# Patient Record
Sex: Female | Born: 1950
Health system: Southern US, Community
[De-identification: ages and names within clinical notes are randomized; demographics above are authoritative.]

## PROBLEM LIST (undated history)

## (undated) DIAGNOSIS — N200 Calculus of kidney: Secondary | ICD-10-CM

## (undated) DIAGNOSIS — IMO0002 Reserved for concepts with insufficient information to code with codable children: Secondary | ICD-10-CM

## (undated) DIAGNOSIS — K5730 Diverticulosis of large intestine without perforation or abscess without bleeding: Secondary | ICD-10-CM

## (undated) DIAGNOSIS — D649 Anemia, unspecified: Secondary | ICD-10-CM

## (undated) DIAGNOSIS — M1990 Unspecified osteoarthritis, unspecified site: Secondary | ICD-10-CM

## (undated) DIAGNOSIS — E669 Obesity, unspecified: Secondary | ICD-10-CM

## (undated) DIAGNOSIS — I10 Essential (primary) hypertension: Secondary | ICD-10-CM

## (undated) DIAGNOSIS — I6529 Occlusion and stenosis of unspecified carotid artery: Secondary | ICD-10-CM

## (undated) HISTORY — PX: CHOLECYSTECTOMY: SHX55

## (undated) HISTORY — DX: Occlusion and stenosis of unspecified carotid artery: I65.29

## (undated) HISTORY — DX: Obesity, unspecified: E66.9

## (undated) HISTORY — PX: TONSILLECTOMY: SUR1361

---

## 2001-09-23 ENCOUNTER — Encounter: Admission: RE | Admit: 2001-09-23 | Discharge: 2001-09-23 | Payer: Self-pay | Admitting: Internal Medicine

## 2001-09-23 ENCOUNTER — Encounter: Payer: Self-pay | Admitting: Internal Medicine

## 2001-10-02 ENCOUNTER — Other Ambulatory Visit: Admission: RE | Admit: 2001-10-02 | Discharge: 2001-10-02 | Payer: Self-pay | Admitting: Internal Medicine

## 2004-08-29 ENCOUNTER — Other Ambulatory Visit: Admission: RE | Admit: 2004-08-29 | Discharge: 2004-08-29 | Payer: Self-pay | Admitting: Internal Medicine

## 2004-10-11 ENCOUNTER — Ambulatory Visit (HOSPITAL_COMMUNITY): Admission: RE | Admit: 2004-10-11 | Discharge: 2004-10-12 | Payer: Self-pay | Admitting: General Surgery

## 2004-10-11 ENCOUNTER — Encounter (INDEPENDENT_AMBULATORY_CARE_PROVIDER_SITE_OTHER): Payer: Self-pay | Admitting: *Deleted

## 2004-10-18 ENCOUNTER — Encounter: Admission: RE | Admit: 2004-10-18 | Discharge: 2004-10-18 | Payer: Self-pay | Admitting: Internal Medicine

## 2006-08-21 ENCOUNTER — Encounter: Admission: RE | Admit: 2006-08-21 | Discharge: 2006-08-21 | Payer: Self-pay | Admitting: Endocrinology

## 2008-01-21 ENCOUNTER — Encounter: Admission: RE | Admit: 2008-01-21 | Discharge: 2008-01-21 | Payer: Self-pay | Admitting: Endocrinology

## 2009-01-21 ENCOUNTER — Encounter: Admission: RE | Admit: 2009-01-21 | Discharge: 2009-01-21 | Payer: Self-pay | Admitting: Endocrinology

## 2010-02-01 ENCOUNTER — Encounter: Admission: RE | Admit: 2010-02-01 | Discharge: 2010-02-01 | Payer: Self-pay | Admitting: Endocrinology

## 2010-04-15 ENCOUNTER — Encounter: Admission: RE | Admit: 2010-04-15 | Discharge: 2010-04-15 | Payer: Self-pay | Admitting: Endocrinology

## 2010-10-25 ENCOUNTER — Encounter
Admission: RE | Admit: 2010-10-25 | Discharge: 2010-10-25 | Payer: Self-pay | Source: Home / Self Care | Attending: Family Medicine | Admitting: Family Medicine

## 2011-02-08 ENCOUNTER — Other Ambulatory Visit: Payer: Self-pay | Admitting: Endocrinology

## 2011-02-08 DIAGNOSIS — Z1231 Encounter for screening mammogram for malignant neoplasm of breast: Secondary | ICD-10-CM

## 2011-02-10 NOTE — Op Note (Signed)
Kristine Gray, Kristine Gray NO.:  [id number]   MEDICAL RECORD NO.:  [id number]          PATIENT TYPE:  OIB   LOCATION:  2550                         FACILITY:  [facility]   PHYSICIAN:  Adolph Pollack, M.D.DATE OF BIRTH:  [birth year]   DATE OF PROCEDURE:  10/11/2004  DATE OF DISCHARGE:                                 OPERATIVE REPORT   PREOPERATIVE DIAGNOSIS:  Biliary dyskinesia.   POSTOPERATIVE DIAGNOSIS:  Biliary dyskinesia.   PROCEDURE:  Laparoscopic cholecystectomy with intraoperative cholangiogram.   ASSISTANT:  Anselm Pancoast. Zachery Dakins, M.D.   ANESTHESIA:  General.   INDICATION:  Kristine Gray is a 60 year old female who has had intermittent  problems with some nausea, vomiting and left upper quadrant pain.  She  periodically has some pain in the right shoulder and between her scapulae.  CT scan and ultrasounds have been unrevealing.  Gastric emptying scan was  normal about a year ago.  Nuclear medicine hepatobiliary scan with CCK  injection demonstrated a depressed gallbladder ejection fraction consistent  with biliary dyskinesia.  She has been seeing Dr. Kinnie Scales.  An upper  endoscopy has not confirmed any significant pathology.  She now presents for  elective laparoscopic cholecystectomy.  The procedure and the risks were  explained to her.   TECHNIQUE:  She was seen in the holding area, then brought to the operating  room, placed supine on the operating table, and general anesthetic was  administered.  The abdominal wall was sterilely prepped and draped.  A  supraumbilical incision was made after injection of Marcaine solution.  The  subcutaneous tissue was dissected bluntly.  The fascia was identified and a  small incision made in the midline fascia.  The peritoneal cavity was  entered bluntly and, under direct vision, a pursestring suture of 0 Vicryl  was placed around the fascial edges.  A Hasson trocar was introduced into  the peritoneal cavity.   Pneumoperitoneum was created by instillation of CO2  gas.   Next, she was placed in reverse Trendelenburg position with the right side  tilted slightly upward.  An 11-mm trocar was placed in the epigastric  incision and two 5-mm trocars were placed in the right midline abdomen.  The  fundus of the gallbladder was grasped and retracted toward the right  shoulder.  The infundibulum was grasped and mobilized, using blunt  dissection and select cautery.  I identified the cystic duct and created a  window around it.  Anterior branch of the cyst artery was also identified  and a window created around it.  Clip was placed at the cyst  duct/gallbladder junction and a small incision made in the cystic duct.  A  Cholangiocath was passed through the anterior abdominal wall,placed into the  cystic duct and a cholangiogram was performed.   Under real time fluoroscopy, dilute contrast was injected into the cystic  duct.  The common hepatic and common bile duct filled properly and drained  contrast into the duodenum properly.  Right and left hepatic ducts were also  seen.  No obstructive lesions were noted.   The cholangiocath was removed, the cystic duct  was clipped three times  proximally and divided.  The anterior branch of the cystic artery was  clipped and divided.  The posterior branch of the cystic artery was  identified, clipped and divided.  The gallbladder was dissected free, using  electrocautery.  There was a small spillage of bile.  The gallbladder was  placed in the EndoCatch bag.   The gallbladder fossa was irrigated.  Bleeding points were controlled with  cautery.  Once hemostasis was adequate and irrigation fluid was clear, I  removed the gallbladder through the subumbilical port and then, under  laparoscopic vision, closed the fascial defect by tightening up and tying  down the pursestring suture.  The perihepatic area was evacuated of its  irrigation fluid by way of  suction.  No  bile leak or bleeding was noted.   The trocars were then removed and the pneumoperitoneum was released.  The  skin incisions were closed with 4-0 Monocryl subcuticular stitches followed  by Steri-Strips and sterile dressings.   She tolerated the procedure well without any apparent complications and was  taken to the recovery room in satisfactory condition.      TJR/MEDQ  D:  10/11/2004  T:  10/11/2004  Job:  [id number]   cc:   Sharlet Salina, M.D.  [address]  [place]  [place] [address]  Fax: [phone]   Tera Mater. Evlyn Kanner, M.D.  [address]  [place]  [place] [address]  Fax: [phone]   Griffith Citron, M.D.  [facility] [place]  [place] [address]  Fax: [phone]

## 2011-02-15 ENCOUNTER — Ambulatory Visit
Admission: RE | Admit: 2011-02-15 | Discharge: 2011-02-15 | Disposition: A | Discharge status: Home / Self Care | Payer: 59 | Source: Ambulatory Visit | Attending: Endocrinology | Admitting: Endocrinology

## 2011-02-15 DIAGNOSIS — Z1231 Encounter for screening mammogram for malignant neoplasm of breast: Secondary | ICD-10-CM

## 2012-02-26 ENCOUNTER — Other Ambulatory Visit: Payer: Self-pay | Admitting: Family Medicine

## 2012-02-26 DIAGNOSIS — Z1231 Encounter for screening mammogram for malignant neoplasm of breast: Secondary | ICD-10-CM

## 2012-03-11 ENCOUNTER — Ambulatory Visit
Admission: RE | Admit: 2012-03-11 | Discharge: 2012-03-11 | Disposition: A | Discharge status: Home / Self Care | Payer: 59 | Source: Ambulatory Visit | Attending: Family Medicine | Admitting: Family Medicine

## 2012-03-11 DIAGNOSIS — Z1231 Encounter for screening mammogram for malignant neoplasm of breast: Secondary | ICD-10-CM

## 2012-03-18 ENCOUNTER — Ambulatory Visit (HOSPITAL_BASED_OUTPATIENT_CLINIC_OR_DEPARTMENT_OTHER)
Admission: RE | Admit: 2012-03-18 | Discharge: 2012-03-18 | Disposition: A | Discharge status: Home / Self Care | Payer: 59 | Source: Ambulatory Visit | Attending: Family Medicine | Admitting: Family Medicine

## 2012-03-18 ENCOUNTER — Encounter (HOSPITAL_COMMUNITY): Payer: Self-pay | Admitting: *Deleted

## 2012-03-18 ENCOUNTER — Inpatient Hospital Stay (HOSPITAL_COMMUNITY)
Admission: EM | Admit: 2012-03-18 | Discharge: 2012-03-22 | DRG: 854 | Disposition: A | Discharge status: Home / Self Care | Payer: 59 | Attending: Family Medicine | Admitting: Family Medicine

## 2012-03-18 ENCOUNTER — Other Ambulatory Visit (HOSPITAL_BASED_OUTPATIENT_CLINIC_OR_DEPARTMENT_OTHER): Payer: Self-pay | Admitting: Family Medicine

## 2012-03-18 DIAGNOSIS — R110 Nausea: Secondary | ICD-10-CM | POA: Insufficient documentation

## 2012-03-18 DIAGNOSIS — N19 Unspecified kidney failure: Secondary | ICD-10-CM | POA: Diagnosis present

## 2012-03-18 DIAGNOSIS — Z9089 Acquired absence of other organs: Secondary | ICD-10-CM

## 2012-03-18 DIAGNOSIS — N12 Tubulo-interstitial nephritis, not specified as acute or chronic: Secondary | ICD-10-CM | POA: Diagnosis present

## 2012-03-18 DIAGNOSIS — IMO0001 Reserved for inherently not codable concepts without codable children: Secondary | ICD-10-CM | POA: Diagnosis present

## 2012-03-18 DIAGNOSIS — N200 Calculus of kidney: Secondary | ICD-10-CM | POA: Diagnosis present

## 2012-03-18 DIAGNOSIS — R109 Unspecified abdominal pain: Secondary | ICD-10-CM

## 2012-03-18 DIAGNOSIS — N1330 Unspecified hydronephrosis: Secondary | ICD-10-CM | POA: Diagnosis present

## 2012-03-18 DIAGNOSIS — Z87442 Personal history of urinary calculi: Secondary | ICD-10-CM

## 2012-03-18 DIAGNOSIS — A4151 Sepsis due to Escherichia coli [E. coli]: Principal | ICD-10-CM | POA: Diagnosis present

## 2012-03-18 DIAGNOSIS — E785 Hyperlipidemia, unspecified: Secondary | ICD-10-CM | POA: Diagnosis present

## 2012-03-18 DIAGNOSIS — E860 Dehydration: Secondary | ICD-10-CM | POA: Diagnosis present

## 2012-03-18 DIAGNOSIS — I10 Essential (primary) hypertension: Secondary | ICD-10-CM | POA: Diagnosis present

## 2012-03-18 DIAGNOSIS — Z880 Allergy status to penicillin: Secondary | ICD-10-CM

## 2012-03-18 DIAGNOSIS — N179 Acute kidney failure, unspecified: Secondary | ICD-10-CM | POA: Diagnosis present

## 2012-03-18 DIAGNOSIS — E872 Acidosis, unspecified: Secondary | ICD-10-CM | POA: Diagnosis present

## 2012-03-18 DIAGNOSIS — K5730 Diverticulosis of large intestine without perforation or abscess without bleeding: Secondary | ICD-10-CM | POA: Insufficient documentation

## 2012-03-18 DIAGNOSIS — N136 Pyonephrosis: Secondary | ICD-10-CM

## 2012-03-18 DIAGNOSIS — R1032 Left lower quadrant pain: Secondary | ICD-10-CM | POA: Insufficient documentation

## 2012-03-18 DIAGNOSIS — E119 Type 2 diabetes mellitus without complications: Secondary | ICD-10-CM | POA: Diagnosis present

## 2012-03-18 DIAGNOSIS — R1031 Right lower quadrant pain: Secondary | ICD-10-CM | POA: Insufficient documentation

## 2012-03-18 DIAGNOSIS — A419 Sepsis, unspecified organism: Secondary | ICD-10-CM | POA: Diagnosis present

## 2012-03-18 HISTORY — DX: Essential (primary) hypertension: I10

## 2012-03-18 HISTORY — DX: Calculus of kidney: N20.0

## 2012-03-18 LAB — URINALYSIS, ROUTINE W REFLEX MICROSCOPIC
Nitrite: NEGATIVE
Protein, ur: 30 mg/dL — AB
Specific Gravity, Urine: 1.027 (ref 1.005–1.030)
Urobilinogen, UA: 0.2 mg/dL (ref 0.0–1.0)
pH: 5 (ref 5.0–8.0)

## 2012-03-18 LAB — DIFFERENTIAL
Basophils Absolute: 0 10*3/uL (ref 0.0–0.1)
Basophils Relative: 0 % (ref 0–1)
Eosinophils Absolute: 0 10*3/uL (ref 0.0–0.7)
Eosinophils Relative: 0 % (ref 0–5)
Lymphocytes Relative: 8 % — ABNORMAL LOW (ref 12–46)
Monocytes Relative: 6 % (ref 3–12)
Neutrophils Relative %: 86 % — ABNORMAL HIGH (ref 43–77)

## 2012-03-18 LAB — CBC
Platelets: 228 10*3/uL (ref 150–400)
RBC: 4.25 MIL/uL (ref 3.87–5.11)
WBC: 16.7 10*3/uL — ABNORMAL HIGH (ref 4.0–10.5)

## 2012-03-18 LAB — BASIC METABOLIC PANEL
BUN: 45 mg/dL — ABNORMAL HIGH (ref 6–23)
Chloride: 88 mEq/L — ABNORMAL LOW (ref 96–112)
GFR calc Af Amer: 30 mL/min — ABNORMAL LOW (ref >90)
Glucose, Bld: 249 mg/dL — ABNORMAL HIGH (ref 70–99)
Potassium: 4.6 mEq/L (ref 3.5–5.1)

## 2012-03-18 LAB — URINE MICROSCOPIC-ADD ON

## 2012-03-18 MED ORDER — MORPHINE SULFATE 4 MG/ML IJ SOLN
4.0000 mg | Freq: Once | INTRAMUSCULAR | Status: AC
  Administered 2012-03-18: 4 mg via INTRAVENOUS
  Filled 2012-03-18: qty 1

## 2012-03-18 MED ORDER — PROMETHAZINE HCL 25 MG/ML IJ SOLN
25.0000 mg | Freq: Once | INTRAMUSCULAR | Status: DC
  Administered 2012-03-18: 25 mg via INTRAMUSCULAR
  Filled 2012-03-18: qty 1

## 2012-03-18 MED ORDER — DEXTROSE 5 % IV SOLN
1.0000 g | Freq: Once | INTRAVENOUS | Status: AC
  Administered 2012-03-18: 1 g via INTRAVENOUS
  Filled 2012-03-18: qty 10

## 2012-03-18 MED ORDER — ONDANSETRON HCL 4 MG/2ML IJ SOLN
4.0000 mg | Freq: Once | INTRAMUSCULAR | Status: AC
  Administered 2012-03-18: 4 mg via INTRAVENOUS
  Filled 2012-03-18: qty 2

## 2012-03-18 MED ORDER — SODIUM CHLORIDE 0.9 % IV SOLN
500.0000 mg | Freq: Once | INTRAVENOUS | Status: AC
  Administered 2012-03-19: 500 mg via INTRAVENOUS
  Filled 2012-03-18: qty 500

## 2012-03-18 MED ORDER — VANCOMYCIN HCL IN DEXTROSE 1-5 GM/200ML-% IV SOLN
1000.0000 mg | Freq: Once | INTRAVENOUS | Status: AC
  Administered 2012-03-19: 1000 mg via INTRAVENOUS
  Filled 2012-03-18: qty 200

## 2012-03-18 MED ORDER — PROMETHAZINE HCL 25 MG/ML IJ SOLN
12.5000 mg | Freq: Once | INTRAMUSCULAR | Status: DC
  Filled 2012-03-18: qty 1

## 2012-03-18 NOTE — Consult Note (Addendum)
Consult requested by Dr. Toniann Fail Re: Left pyonephrosis, left UPJ stone  History of Present Illness: Patient presents with 2 days of left flank pain that has been getting progressively worse. It started [place] just after [place]. She's had associated nausea and vomiting. She's also had fevers as high as 102. She went to see her primary care physician today who ordered a urinalysis and a CBC which did show a leukocytosis of 17,000. He is sent her for a CT scan which demonstrated a 1 cm UPJ stone, 1 cm LLP stone and a 5 mm LLP stone. She has air in the collecting system and anterior to the kidney.   She's had no trouble voiding and no dysuria.   She saw seen in our office in 2007 with two 1 cm LLP stones. She did not want surgical therapy at the time.     Past Medical History  Diagnosis Date  . Kidney calculi   . Diabetes mellitus   . Hypertension    History reviewed. No pertinent past surgical history.  Home Medications:   (Not in a hospital admission) Allergies:  Allergies  Allergen Reactions  . Penicillins Hives, Itching and Swelling    History reviewed. No pertinent family history. Social History:  reports that she has never smoked. She does not have any smokeless tobacco history on file. She reports that she does not drink alcohol. Her drug history not on file.  ROS: A complete review of systems was performed.  All systems are negative except for pertinent findings as noted. @ROS @   Physical Exam:  Vital signs in last 24 hours: Temp:  [98.9 F (37.2 C)-99.3 F (37.4 C)] 99.3 F (37.4 C) (06/24 2256) Pulse Rate:  [70-77] 70  (06/24 2256) Resp:  [18-20] 20  (06/24 2256) BP: (126-135)/(48-77) 126/48 mmHg (06/24 2256) SpO2:  [97 %-98 %] 98 % (06/24 2256) General:  Alert and oriented, No acute distress. She is vomiting.   HEENT: Normocephalic, atraumatic Neck: No JVD or lymphadenopathy Cardiovascular: Regular rate and rhythm Lungs: Regular rate and effort Abdomen: Soft,  nontender, nondistended, no abdominal masses Back: No CVA tenderness Extremities: No edema Neurologic: Grossly intact  Laboratory Data:  Results for orders placed during the hospital encounter of 03/18/12 (from the past 24 hour(s))  CBC     Status: Abnormal   Collection Time   03/18/12  8:38 PM      Component Value Range   WBC 16.7 (*) 4.0 - 10.5 K/uL   RBC 4.25  3.87 - 5.11 MIL/uL   Hemoglobin 12.4  12.0 - 15.0 g/dL   HCT 16.1  09.6 - 04.5 %   MCV 88.2  78.0 - 100.0 fL   MCH 29.2  26.0 - 34.0 pg   MCHC 33.1  30.0 - 36.0 g/dL   RDW 40.9  81.1 - 91.4 %   Platelets 228  150 - 400 K/uL  DIFFERENTIAL     Status: Abnormal   Collection Time   03/18/12  8:38 PM      Component Value Range   Neutrophils Relative 86 (*) 43 - 77 %   Lymphocytes Relative 8 (*) 12 - 46 %   Monocytes Relative 6  3 - 12 %   Eosinophils Relative 0  0 - 5 %   Basophils Relative 0  0 - 1 %   Neutro Abs 14.4 (*) 1.7 - 7.7 K/uL   Lymphs Abs 1.3  0.7 - 4.0 K/uL   Monocytes Absolute 1.0  0.1 -  1.0 K/uL   Eosinophils Absolute 0.0  0.0 - 0.7 K/uL   Basophils Absolute 0.0  0.0 - 0.1 K/uL   WBC Morphology FEW NEUTROPHIL BANDS PRESNT     Smear Review LARGE PLATELETS PRESENT    BASIC METABOLIC PANEL     Status: Abnormal   Collection Time   03/18/12  8:38 PM      Component Value Range   Sodium 132 (*) 135 - 145 mEq/L   Potassium 4.6  3.5 - 5.1 mEq/L   Chloride 88 (*) 96 - 112 mEq/L   CO2 16 (*) 19 - 32 mEq/L   Glucose, Bld 249 (*) 70 - 99 mg/dL   BUN 45 (*) 6 - 23 mg/dL   Creatinine, Ser 4.13 (*) 0.50 - 1.10 mg/dL   Calcium 24.4  8.4 - 01.0 mg/dL   GFR calc non Af Amer 26 (*) >90 mL/min   GFR calc Af Amer 30 (*) >90 mL/min  URINALYSIS, ROUTINE W REFLEX MICROSCOPIC     Status: Abnormal   Collection Time   03/18/12  8:39 PM      Component Value Range   Color, Urine YELLOW  YELLOW   APPearance CLOUDY (*) CLEAR   Specific Gravity, Urine 1.027  1.005 - 1.030   pH 5.0  5.0 - 8.0   Glucose, UA >1000 (*) NEGATIVE  mg/dL   Hgb urine dipstick LARGE (*) NEGATIVE   Bilirubin Urine MODERATE (*) NEGATIVE   Ketones, ur 40 (*) NEGATIVE mg/dL   Protein, ur 30 (*) NEGATIVE mg/dL   Urobilinogen, UA 0.2  0.0 - 1.0 mg/dL   Nitrite NEGATIVE  NEGATIVE   Leukocytes, UA SMALL (*) NEGATIVE  URINE MICROSCOPIC-ADD ON     Status: Normal   Collection Time   03/18/12  8:39 PM      Component Value Range   Squamous Epithelial / LPF RARE  RARE   WBC, UA 11-20  <3 WBC/hpf   RBC / HPF 11-20  <3 RBC/hpf   Bacteria, UA RARE  RARE   No results found for this or any previous visit (from the past 240 hour(s)). Creatinine:  Basename 03/18/12 2038  CREATININE 1.99*   CT A/P as above - I reviewed the images  Impression/Assessment:  Left UPJ stone Emphysematous Pyonephrosis LLP stones Fever, WBC, ARF, emesis  Plan:  I went over the patients CT results with her. We discussed the nature. R/B of continued medical management alone/non-surgical, ureteral stent or left PCNx tube. She elects left PCNx tube which will allow further decompression of collecting system and possible future access for PCNL. I spoke with Dr. Deanne Coffer about patient and he will come in to place left perc nephrostomy tube.   Follow clinically and repeat CT A/P with in 48 hours to ensure no perinephric abscess has developed.   She is being admitted to medicine for sepsis management for this patient with DM. Will follow. I spoke with Dr. Kirtland Bouchard.    Will follow.  Antony Haste 03/18/2012, 11:33 PM

## 2012-03-18 NOTE — ED Notes (Signed)
Pt family member [facility] [phone] Cell [phone]

## 2012-03-18 NOTE — ED Provider Notes (Signed)
History     CSN: [id number]  Arrival date & time 03/18/12  4098   First MD Initiated Contact with Patient 03/18/12 2020      Chief Complaint  Patient presents with  . Flank Pain    (Consider location/radiation/quality/duration/timing/severity/associated sxs/prior treatment) HPI Comments: Patient presents with 3 days of left flank pain that has been getting progressively worse.  She's had associated nausea and vomiting with it.  She's also had fevers as high as 102.  She went to see her primary care physician today who ordered a urinalysis and a CBC which did show a leukocytosis of 17,000 any UTI.  He is sent her for a CT scan which demonstrated pyonephrosis.  Per the patient she had supposedly been arranged for a direct admit but the flow manager here has no knowledge of this patient as a direct admit so we will evaluate her here in the emergency department and call for admission when her results are returned.  Patient had not started on any antibiotic therapy.  She taken one dose of Zofran for nausea.  She has nausea now at this time and shooting left flank pain.  Patient is a 61 y.o. female presenting with flank pain. The history is provided by the patient and the spouse.  Flank Pain Pertinent negatives include no chest pain, no abdominal pain, no headaches and no shortness of breath.    Past Medical History  Diagnosis Date  . Kidney calculi   . Diabetes mellitus   . Hypertension     History reviewed. No pertinent past surgical history.  History reviewed. No pertinent family history.  History  Substance Use Topics  . Smoking status: Never Smoker   . Smokeless tobacco: Not on file  . Alcohol Use: No    OB History    Grav Para Term Preterm Abortions TAB SAB Ect Mult Living                  Review of Systems  Constitutional: Positive for fever. Negative for chills.  HENT: Negative.   Eyes: Negative.   Respiratory: Negative.  Negative for cough and shortness of breath.    Cardiovascular: Negative.  Negative for chest pain.  Gastrointestinal: Positive for nausea and vomiting. Negative for abdominal pain and diarrhea.  Genitourinary: Positive for dysuria and flank pain.  Musculoskeletal: Negative.  Negative for back pain.  Skin: Negative.  Negative for color change and rash.  Neurological: Negative.  Negative for syncope and headaches.  Hematological: Negative.  Negative for adenopathy.  Psychiatric/Behavioral: Negative.  Negative for confusion.  All other systems reviewed and are negative.    Allergies  Penicillins  Home Medications   Current Outpatient Rx  Name Route Sig Dispense Refill  . ACETAMINOPHEN 500 MG PO TABS Oral Take 500 mg by mouth every 6 (six) hours as needed. For pain    . ACETAMINOPHEN-CODEINE #3 300-30 MG PO TABS Oral Take 1 tablet by mouth every 4 (four) hours as needed. For pain    . ASPIRIN EC 81 MG PO TBEC Oral Take 162 mg by mouth daily.    . AZITHROMYCIN 1 % OP SOLN Both Eyes Place 1 drop into both eyes as needed.    Marland Kitchen BLACK COHOSH PO Oral Take 1 tablet by mouth 2 (two) times daily.    Marland Kitchen CALCIUM CARBONATE 600 MG PO TABS Oral Take 1,500 mg by mouth daily.    Marland Kitchen VITAMIN D3 2000 UNITS PO TABS Oral Take 1 tablet by mouth daily.    Marland Kitchen  CHOLINE FENOFIBRATE 135 MG PO CPDR Oral Take 135 mg by mouth daily.    Marland Kitchen CIPROFLOXACIN HCL 500 MG PO TABS Oral Take 500 mg by mouth 2 (two) times daily.    Marland Kitchen EZETIMIBE 10 MG PO TABS Oral Take 10 mg by mouth daily.    Marland Kitchen FLUTICASONE PROPIONATE 50 MCG/ACT NA SUSP Nasal Place 1 spray into the nose daily.    Marland Kitchen FOLIC ACID 400 MCG PO TABS Oral Take 400 mcg by mouth daily.    . IBUPROFEN 600 MG PO TABS Oral Take 600 mg by mouth every 6 (six) hours as needed. For pain    . INSULIN REGULAR HUMAN 100 UNIT/ML IJ SOLN Subcutaneous Inject 5-16 Units into the skin 2 (two) times daily before a meal. Takes 16 units in the morning and 5 units at night    . MAGNESIUM OXIDE 400 MG PO TABS Oral Take 400 mg by mouth daily.     . MELOXICAM 15 MG PO TABS Oral Take 15 mg by mouth daily.    Marland Kitchen NAPHAZOLINE-PHENIRAMINE 0.027-0.315 % OP SOLN Ophthalmic Apply 1 drop to eye 2 (two) times daily.    . NEBIVOLOL HCL 20 MG PO TABS Oral Take 1 tablet by mouth daily.    Marland Kitchen OLMESARTAN MEDOXOMIL-HCTZ 40-12.5 MG PO TABS Oral Take 1 tablet by mouth daily.    Marland Kitchen FISH OIL 1000 MG PO CAPS Oral Take 1 capsule by mouth 2 (two) times daily.    Marland Kitchen ONDANSETRON HCL 8 MG PO TABS Oral Take by mouth every 6 (six) hours as needed. For nausea    . OXYMETAZOLINE HCL 0.05 % NA SOLN Nasal Place 2 sprays into the nose as needed. For nasal congestion    . ROSUVASTATIN CALCIUM 20 MG PO TABS Oral Take 20 mg by mouth daily.    Marland Kitchen VITAMIN C 500 MG PO TABS Oral Take 500 mg by mouth daily.    Marland Kitchen VITAMIN E 400 UNITS PO CAPS Oral Take 400 Units by mouth daily.    Marland Kitchen ZINC GLUCONATE 50 MG PO TABS Oral Take 50 mg by mouth daily.      BP 135/77  Pulse 77  Temp 98.9 F (37.2 C) (Oral)  Resp 18  SpO2 97%  Physical Exam  Nursing note and vitals reviewed. Constitutional: She is oriented to person, place, and time. She appears well-developed and well-nourished.  Non-toxic appearance. She does not have a sickly appearance.  HENT:  Head: Normocephalic and atraumatic.  Eyes: Conjunctivae, EOM and lids are normal. Pupils are equal, round, and reactive to light. No scleral icterus.  Neck: Trachea normal and normal range of motion. Neck supple.  Cardiovascular: Normal rate, regular rhythm and normal heart sounds.   Pulmonary/Chest: Effort normal and breath sounds normal. No respiratory distress. She has no wheezes. She has no rales.  Abdominal: Soft. Normal appearance. There is no tenderness. There is no rebound, no guarding and no CVA tenderness.  Genitourinary:       Left CVA tenderness   Musculoskeletal: Normal range of motion.  Neurological: She is alert and oriented to person, place, and time. She has normal strength.  Skin: Skin is warm, dry and intact. No rash  noted.  Psychiatric: She has a normal mood and affect. Her behavior is normal. Judgment and thought content normal.    ED Course  Procedures (including critical care time)  Labs Reviewed  CBC - Abnormal; Notable for the following:    WBC 16.7 (*)  All other components within normal limits  DIFFERENTIAL  BASIC METABOLIC PANEL  URINALYSIS, ROUTINE W REFLEX MICROSCOPIC  URINE CULTURE   Ct Abdomen Pelvis Wo Contrast  03/18/2012  *RADIOLOGY REPORT*  Clinical Data: Bilateral flank pain.  Nausea.  CT ABDOMEN AND PELVIS WITHOUT CONTRAST  Technique:  Multidetector CT imaging of the abdomen and pelvis was performed following the standard protocol without intravenous contrast.  Comparison: None.  Findings: The liver and spleen have normal uninfused features.  The stomach, pancreas, and adrenal glands are unremarkable.  Large duodenal diverticulum noted.  Gallbladder is surgically absent.  The right kidney is atrophic but otherwise unremarkable.  Multiple stones are seen in the left kidney.  The largest is in the renal pelvis and measures 11 x 12 x 7 mm.  A second stone in the interpolar region measures 8 x 12 mm.  A third 5 mm stone is seen towards the lower pole.  Multiple areas of gas are seen with in the collecting system of the left kidney, adjacent to and abutting the larger stones.  There is also some free gas in the perirenal fat around the left kidney.  No abdominal aortic aneurysm.  No free fluid or lymphadenopathy in the abdomen.  Imaging through the pelvis shows no free intraperitoneal fluid.  No pelvic sidewall lymphadenopathy.  Uterus is unremarkable.  There is no adnexal mass.  Tiny amount of gas is seen within the urinary bladder.  Scattered diverticular changes are seen in the left colon without diverticulitis.  Terminal ileum and appendix are normal.  Bone windows reveal no worrisome lytic or sclerotic osseous lesions.  IMPRESSION: Several large stones in the left intrarenal collecting  system with gas seen in the collecting system around the stones and also in the retroperitoneal fat anterior to the kidney.  These changes are associated with a small amount of gas in the urinary bladder. 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  I cannot identify no definite gas within the renal parenchyma to suggest emphysematous pyelonephritis.  There is not a substantial amount of edema or fluid around the left kidney or left collecting system.  I personally discussed these results by telephone with Dr. Everlene Other at 1850 hours on 03/18/2012.  Original Report Authenticated By: ERIC A. MANSELL, M.D.     No diagnosis found.    MDM  Patient with pyonephrosis seen on her CT scan completed as an outpatient earlier today.  Patient has not received antibiotics yet so give her a dose of ceftriaxone for broader coverage.  Patient will also receive morphine for pain and Zofran for nausea.  Patient will require admission for continued monitoring and treatment of this.  As her primary care doctor is Dr. Everlene Other she will be admitted as an unassigned patient.        Nat Christen, MD 03/18/12 2107

## 2012-03-18 NOTE — ED Provider Notes (Signed)
61 y/o female with pyonephrosis  And a white count of 17 for admission. Pt is stable with persistent nausea. Urology consult reccomends urgent stent placement.  Wynetta Emery, PA-C 03/21/12 1625

## 2012-03-18 NOTE — ED Notes (Signed)
Pt had CT scan today at [facility] ordered by PCP.  States she has had chronic kidney stones on the left side for 5 years but today they noticed 3 stones and said her kidney had "burst".  Came to hospital and was told she was a direct admission but states she was told [facility] will admit her.

## 2012-03-18 NOTE — ED Notes (Signed)
States here for kidney stones. History of 2 stones since 2005. CT today showed 3. Pain located over left flank. States started early Saturday morning. Thought she was passing stone. Complaining of nausea with vomiting. Rates pain 5/10.

## 2012-03-18 NOTE — ED Notes (Signed)
Lt flank pain since Saturday am with nausea and vomiting.  She was seen at med center earlier today and had a c-t.   That showed a larfew stone that had burst and perforated the kidney

## 2012-03-19 ENCOUNTER — Inpatient Hospital Stay (HOSPITAL_COMMUNITY): Payer: 59

## 2012-03-19 ENCOUNTER — Encounter (HOSPITAL_COMMUNITY): Payer: Self-pay | Admitting: Internal Medicine

## 2012-03-19 DIAGNOSIS — E872 Acidosis: Secondary | ICD-10-CM

## 2012-03-19 DIAGNOSIS — R5381 Other malaise: Secondary | ICD-10-CM

## 2012-03-19 DIAGNOSIS — A419 Sepsis, unspecified organism: Secondary | ICD-10-CM | POA: Diagnosis present

## 2012-03-19 DIAGNOSIS — I10 Essential (primary) hypertension: Secondary | ICD-10-CM | POA: Diagnosis present

## 2012-03-19 DIAGNOSIS — N19 Unspecified kidney failure: Secondary | ICD-10-CM | POA: Diagnosis present

## 2012-03-19 DIAGNOSIS — E119 Type 2 diabetes mellitus without complications: Secondary | ICD-10-CM | POA: Diagnosis present

## 2012-03-19 DIAGNOSIS — N12 Tubulo-interstitial nephritis, not specified as acute or chronic: Secondary | ICD-10-CM | POA: Diagnosis present

## 2012-03-19 DIAGNOSIS — E1165 Type 2 diabetes mellitus with hyperglycemia: Secondary | ICD-10-CM

## 2012-03-19 DIAGNOSIS — E785 Hyperlipidemia, unspecified: Secondary | ICD-10-CM | POA: Diagnosis present

## 2012-03-19 DIAGNOSIS — R6510 Systemic inflammatory response syndrome (SIRS) of non-infectious origin without acute organ dysfunction: Secondary | ICD-10-CM

## 2012-03-19 LAB — CBC
HCT: 37.8 % (ref 36.0–46.0)
MCHC: 33.6 g/dL (ref 30.0–36.0)
RDW: 14.6 % (ref 11.5–15.5)

## 2012-03-19 LAB — COMPREHENSIVE METABOLIC PANEL
ALT: 28 U/L (ref 0–35)
Alkaline Phosphatase: 78 U/L (ref 39–117)
CO2: 18 mEq/L — ABNORMAL LOW (ref 19–32)
Chloride: 88 mEq/L — ABNORMAL LOW (ref 96–112)
GFR calc Af Amer: 27 mL/min — ABNORMAL LOW (ref >90)
GFR calc non Af Amer: 24 mL/min — ABNORMAL LOW (ref >90)
Glucose, Bld: 291 mg/dL — ABNORMAL HIGH (ref 70–99)
Potassium: 5 mEq/L (ref 3.5–5.1)
Sodium: 134 mEq/L — ABNORMAL LOW (ref 135–145)
Total Protein: 7.7 g/dL (ref 6.0–8.3)

## 2012-03-19 LAB — POCT I-STAT 3, ART BLOOD GAS (G3+)
Bicarbonate: 18.7 mEq/L — ABNORMAL LOW (ref 20.0–24.0)
pH, Arterial: 7.37 (ref 7.350–7.400)
pO2, Arterial: 75 mmHg — ABNORMAL LOW (ref 80.0–100.0)

## 2012-03-19 MED ORDER — INSULIN ASPART 100 UNIT/ML ~~LOC~~ SOLN
0.0000 [IU] | Freq: Three times a day (TID) | SUBCUTANEOUS | Status: DC
  Administered 2012-03-19: 5 [IU] via SUBCUTANEOUS
  Administered 2012-03-19: 3 [IU] via SUBCUTANEOUS
  Administered 2012-03-20: 5 [IU] via SUBCUTANEOUS

## 2012-03-19 MED ORDER — FENTANYL CITRATE 0.05 MG/ML IJ SOLN
INTRAMUSCULAR | Status: AC
  Filled 2012-03-19: qty 4

## 2012-03-19 MED ORDER — MIDAZOLAM HCL 5 MG/5ML IJ SOLN
INTRAMUSCULAR | Status: AC | PRN
  Administered 2012-03-19: 1 mg via INTRAVENOUS

## 2012-03-19 MED ORDER — FLUTICASONE PROPIONATE 50 MCG/ACT NA SUSP
1.0000 | Freq: Every day | NASAL | Status: DC
  Administered 2012-03-19 – 2012-03-22 (×4): 1 via NASAL
  Filled 2012-03-19: qty 16

## 2012-03-19 MED ORDER — NEBIVOLOL HCL 10 MG PO TABS
20.0000 mg | ORAL_TABLET | Freq: Every day | ORAL | Status: DC
  Administered 2012-03-20 – 2012-03-22 (×3): 20 mg via ORAL
  Filled 2012-03-19 (×4): qty 2

## 2012-03-19 MED ORDER — ONDANSETRON HCL 4 MG PO TABS: 4.0000 mg | ORAL_TABLET | Freq: Four times a day (QID) | ORAL | Status: DC | PRN

## 2012-03-19 MED ORDER — NEBIVOLOL HCL 10 MG PO TABS
20.0000 mg | ORAL_TABLET | Freq: Every day | ORAL | Status: DC
  Filled 2012-03-19: qty 2

## 2012-03-19 MED ORDER — VANCOMYCIN HCL 1000 MG IV SOLR
1500.0000 mg | INTRAVENOUS | Status: DC
  Administered 2012-03-19 – 2012-03-21 (×3): 1500 mg via INTRAVENOUS
  Filled 2012-03-19 (×4): qty 1500

## 2012-03-19 MED ORDER — ACETAMINOPHEN 650 MG RE SUPP: 650.0000 mg | Freq: Four times a day (QID) | RECTAL | Status: DC | PRN

## 2012-03-19 MED ORDER — INSULIN ASPART 100 UNIT/ML ~~LOC~~ SOLN: 0.0000 [IU] | Freq: Three times a day (TID) | SUBCUTANEOUS | Status: DC

## 2012-03-19 MED ORDER — HYDROMORPHONE HCL PF 1 MG/ML IJ SOLN
1.0000 mg | INTRAMUSCULAR | Status: DC | PRN
  Filled 2012-03-19: qty 1

## 2012-03-19 MED ORDER — HYDROCODONE-ACETAMINOPHEN 5-325 MG PO TABS
1.0000 | ORAL_TABLET | ORAL | Status: DC | PRN
  Administered 2012-03-19 (×2): 1 via ORAL
  Administered 2012-03-19: 2 via ORAL
  Filled 2012-03-19 (×2): qty 1
  Filled 2012-03-19: qty 2

## 2012-03-19 MED ORDER — INSULIN ASPART 100 UNIT/ML ~~LOC~~ SOLN
0.0000 [IU] | Freq: Every day | SUBCUTANEOUS | Status: DC
  Administered 2012-03-19: 3 [IU] via SUBCUTANEOUS

## 2012-03-19 MED ORDER — ONDANSETRON HCL 4 MG/2ML IJ SOLN
4.0000 mg | Freq: Once | INTRAMUSCULAR | Status: AC
  Administered 2012-03-19: 4 mg via INTRAVENOUS
  Filled 2012-03-19: qty 2

## 2012-03-19 MED ORDER — MIDAZOLAM HCL 2 MG/2ML IJ SOLN
INTRAMUSCULAR | Status: AC
  Filled 2012-03-19: qty 4

## 2012-03-19 MED ORDER — SODIUM CHLORIDE 0.9 % IV BOLUS (SEPSIS)
500.0000 mL | Freq: Once | INTRAVENOUS | Status: AC
  Administered 2012-03-19: 500 mL via INTRAVENOUS

## 2012-03-19 MED ORDER — SODIUM CHLORIDE 0.9 % IV SOLN
Freq: Once | INTRAVENOUS | Status: AC
  Administered 2012-03-19: 02:00:00 via INTRAVENOUS

## 2012-03-19 MED ORDER — GI COCKTAIL ~~LOC~~
30.0000 mL | Freq: Once | ORAL | Status: AC
  Administered 2012-03-19: 30 mL via ORAL
  Filled 2012-03-19: qty 30

## 2012-03-19 MED ORDER — FENTANYL CITRATE 0.05 MG/ML IJ SOLN
INTRAMUSCULAR | Status: AC | PRN
  Administered 2012-03-19: 25 ug via INTRAVENOUS

## 2012-03-19 MED ORDER — INSULIN ASPART 100 UNIT/ML ~~LOC~~ SOLN: 0.0000 [IU] | Freq: Every day | SUBCUTANEOUS | Status: DC

## 2012-03-19 MED ORDER — ONDANSETRON HCL 4 MG/2ML IJ SOLN
4.0000 mg | Freq: Four times a day (QID) | INTRAMUSCULAR | Status: DC | PRN
  Administered 2012-03-19: 4 mg via INTRAVENOUS
  Filled 2012-03-19: qty 2

## 2012-03-19 MED ORDER — SODIUM CHLORIDE 0.9 % IJ SOLN
3.0000 mL | Freq: Two times a day (BID) | INTRAMUSCULAR | Status: DC
  Administered 2012-03-19 (×2): 3 mL via INTRAVENOUS

## 2012-03-19 MED ORDER — IOHEXOL 300 MG/ML  SOLN
50.0000 mL | Freq: Once | INTRAMUSCULAR | Status: AC | PRN
  Administered 2012-03-19: 15 mL

## 2012-03-19 MED ORDER — ACETAMINOPHEN 325 MG PO TABS: 650.0000 mg | ORAL_TABLET | Freq: Four times a day (QID) | ORAL | Status: DC | PRN

## 2012-03-19 MED ORDER — SODIUM CHLORIDE 0.9 % IV SOLN
INTRAVENOUS | Status: DC
  Administered 2012-03-19 (×2): via INTRAVENOUS
  Administered 2012-03-19: 150 mL/h via INTRAVENOUS
  Administered 2012-03-20 – 2012-03-21 (×2): via INTRAVENOUS

## 2012-03-19 MED ORDER — SODIUM CHLORIDE 0.9 % IV SOLN
250.0000 mg | Freq: Four times a day (QID) | INTRAVENOUS | Status: DC
  Administered 2012-03-19 – 2012-03-21 (×9): 250 mg via INTRAVENOUS
  Filled 2012-03-19 (×11): qty 250

## 2012-03-19 NOTE — ED Notes (Signed)
Patient transported to IR 

## 2012-03-19 NOTE — Progress Notes (Signed)
Inpatient Diabetes Program Recommendations  AACE/ADA: New Consensus Statement on Inpatient Glycemic Control (2009)  Target Ranges:  Prepandial:   less than 140 mg/dL      Peak postprandial:   less than 180 mg/dL (1-2 hours)      Critically ill patients:  140 - 180 mg/dL   Reason for Visit: Hyperglycemia Aware that sepsis can cause hypoglycemia, however cbg's are running continuously in the 200's.  Inpatient Diabetes Program Recommendations Insulin - Basal: Please consider addition of Lantus 10-15 units at HS or daily. HgbA1C: Please check  Note: Thank you, Lenor Coffin, RN, CNS, Diabetes Coordinator [phone])

## 2012-03-19 NOTE — Progress Notes (Signed)
Patient ID: Kristine Gray, female   DOB: 05-03-1951, 61 y.o.   MRN: [id number]  She is feeling much better today. Resuming a regular diet.   Rt Nx - good clear urine output. I reviewed Nx films.  I: Sepsis Pyonephrosis Nephrolithiasis  P: No new recs.

## 2012-03-19 NOTE — Progress Notes (Signed)
MD notified of pt. Systolic BP in 74/29 at 0500.  When recycled BP still 75/29, then 78/28. Patient alert and oriented and reports relief of n/v symptoms that she had earlier and denies dizziness. Waiting for new orders.   Will continue to monitor patient.  Timmie Foerster, RN

## 2012-03-19 NOTE — Progress Notes (Signed)
ANTIBIOTIC CONSULT NOTE - INITIAL  Pharmacy Consult for vancomycin and imipenem  Indication: pylenephritis/sepsis  Allergies  Allergen Reactions  . Penicillins Hives, Itching and Swelling    Patient Measurements: Height: 5\' 4"  (162.6 cm) Weight: 242 lb 15.2 oz (110.2 kg) IBW/kg (Calculated) : 54.7  Adjusted Body Weight:   Vital Signs: Temp: 99.3 F (37.4 C) (06/24 2256) Temp src: Oral (06/24 2256) BP: 113/53 mmHg (06/25 0300) Pulse Rate: 77  (06/25 0300) Intake/Output from previous day: 06/24 0701 - 06/25 0700 In: 200 [IV Piggyback:200] Out: -  Intake/Output from this shift: Total I/O In: 200 [IV Piggyback:200] Out: -   Labs:  [facility] 03/18/12 2038  WBC 16.7*  HGB 12.4  PLT 228  LABCREA --  CREATININE 1.99*   Estimated Creatinine Clearance: 35.6 ml/min (by C-G formula based on Cr of 1.99). No results found for this basename: VANCOTROUGH:2,VANCOPEAK:2,VANCORANDOM:2,GENTTROUGH:2,GENTPEAK:2,GENTRANDOM:2,TOBRATROUGH:2,TOBRAPEAK:2,TOBRARND:2,AMIKACINPEAK:2,AMIKACINTROU:2,AMIKACIN:2, in the last 72 hours   Microbiology: No results found for this or any previous visit (from the past 720 hour(s)).  Medical History: Past Medical History  Diagnosis Date  . Kidney calculi   . Diabetes mellitus   . Hypertension     Medications:  Prescriptions prior to admission  Medication Sig Dispense Refill  . acetaminophen (TYLENOL) 500 MG tablet Take 500 mg by mouth every 6 (six) hours as needed. For pain      . acetaminophen-codeine (TYLENOL #3) 300-30 MG per tablet Take 1 tablet by mouth every 4 (four) hours as needed. For pain      . aspirin EC 81 MG tablet Take 162 mg by mouth daily.      Marland Kitchen azithromycin (AZASITE) 1 % ophthalmic solution Place 1 drop into both eyes as needed.      Marland Kitchen BLACK COHOSH PO Take 1 tablet by mouth 2 (two) times daily.      . calcium carbonate (OS-CAL) 600 MG TABS Take 1,500 mg by mouth daily.      . Cholecalciferol (VITAMIN D3) 2000 UNITS TABS Take 1  tablet by mouth daily.      . Choline Fenofibrate (TRILIPIX) 135 MG capsule Take 135 mg by mouth daily.      . ciprofloxacin (CIPRO) 500 MG tablet Take 500 mg by mouth 2 (two) times daily.      Marland Kitchen ezetimibe (ZETIA) 10 MG tablet Take 10 mg by mouth daily.      . fluticasone (FLONASE) 50 MCG/ACT nasal spray Place 1 spray into the nose daily.      . folic acid (FOLVITE) 400 MCG tablet Take 400 mcg by mouth daily.      Marland Kitchen ibuprofen (ADVIL,MOTRIN) 600 MG tablet Take 600 mg by mouth every 6 (six) hours as needed. For pain      . insulin regular (NOVOLIN R,HUMULIN R) 100 units/mL injection Inject 5-16 Units into the skin 2 (two) times daily before a meal. Takes 16 units in the morning and 5 units at night      . magnesium oxide (MAG-OX) 400 MG tablet Take 400 mg by mouth daily.      . meloxicam (MOBIC) 15 MG tablet Take 15 mg by mouth daily.      March Rummage (EYE ALLERGY RELIEF) 0.027-0.315 % SOLN Apply 1 drop to eye 2 (two) times daily.      . Nebivolol HCl (BYSTOLIC) 20 MG TABS Take 1 tablet by mouth daily.      Marland Kitchen olmesartan-hydrochlorothiazide (BENICAR HCT) 40-12.5 MG per tablet Take 1 tablet by mouth daily.      Marland Kitchen  Omega-3 Fatty Acids (FISH OIL) 1000 MG CAPS Take 1 capsule by mouth 2 (two) times daily.      . ondansetron (ZOFRAN) 8 MG tablet Take by mouth every 6 (six) hours as needed. For nausea      . oxymetazoline (NASAL DECONGESTANT SPRAY) 0.05 % nasal spray Place 2 sprays into the nose as needed. For nasal congestion      . rosuvastatin (CRESTOR) 20 MG tablet Take 20 mg by mouth daily.      . vitamin C (ASCORBIC ACID) 500 MG tablet Take 500 mg by mouth daily.      . vitamin E 400 UNIT capsule Take 400 Units by mouth daily.      Marland Kitchen zinc gluconate 50 MG tablet Take 50 mg by mouth daily.       Assessment: Complicated pyelonephritis with possible sepsis  Goal of Therapy:  Vancomycin trough level 15-20 mcg/ml  Plan:  Imipenem 250mg  q6h  Vancomycin 1500 mg q24h   Janice Coffin 03/19/2012,3:18 AM

## 2012-03-19 NOTE — Procedures (Signed)
Left 3F perc nephrostomy No complication No blood loss. See complete dictation in [facility].

## 2012-03-19 NOTE — ED Notes (Signed)
Returned from IR

## 2012-03-19 NOTE — H&P (Signed)
Kristine Gray is an 61 y.o. female.   PCP - Dr.Bouska. Endocrinologist - Dr.South. Chief Complaint: Abdominal pain. HPI: 61 year-old female with known history of diabetes mellitus2, hypertension, hyperlipidemia has been experiencing left flank pain over the last 3 days which has been worsening and associated with nausea and vomiting and subjective feeling of fever and chills. The pain is constant dull aching and nonradiating. Since the pain has been persistent she had gone to her PCP yesterday and a CT scan of the abdomen and pelvis was ordered which showed complicated left-sided pyelonephritis and patient was referred to the ER. At this time patient has been admitted for complicated for pyelonephritis. Due to the persistent nausea vomiting patient has been on eating anything for last few days. Denies any chest pain or shortness of breath or diarrhea. Denies any dizziness or loss of consciousness. Dr. Mena Goes, urologist on call has been already consulted. Dr. Mena Goes has already spoken with interventional radiologist on call for nephrostomy tube placement given her complicated pyelonephritis with possible hydronephrosis secondary to obstruction. Patient also is found to be in acidosis.  Past Medical History  Diagnosis Date  . Kidney calculi   . Diabetes mellitus   . Hypertension     Past Surgical History  Procedure Date  . Cholecystectomy   . Tonsillectomy     History reviewed. No pertinent family history. Social History:  reports that she has never smoked. She does not have any smokeless tobacco history on file. She reports that she does not drink alcohol. Her drug history not on file.  Allergies:  Allergies  Allergen Reactions  . Penicillins Hives, Itching and Swelling     (Not in a hospital admission)  Results for orders placed during the hospital encounter of 03/18/12 (from the past 48 hour(s))  CBC     Status: Abnormal   Collection Time   03/18/12  8:38 PM   Component Value Range Comment   WBC 16.7 (*) 4.0 - 10.5 K/uL    RBC 4.25  3.87 - 5.11 MIL/uL    Hemoglobin 12.4  12.0 - 15.0 g/dL    HCT 06.3  01.6 - 01.0 %    MCV 88.2  78.0 - 100.0 fL    MCH 29.2  26.0 - 34.0 pg    MCHC 33.1  30.0 - 36.0 g/dL    RDW 93.2  35.5 - 73.2 %    Platelets 228  150 - 400 K/uL   DIFFERENTIAL     Status: Abnormal   Collection Time   03/18/12  8:38 PM      Component Value Range Comment   Neutrophils Relative 86 (*) 43 - 77 %    Lymphocytes Relative 8 (*) 12 - 46 %    Monocytes Relative 6  3 - 12 %    Eosinophils Relative 0  0 - 5 %    Basophils Relative 0  0 - 1 %    Neutro Abs 14.4 (*) 1.7 - 7.7 K/uL    Lymphs Abs 1.3  0.7 - 4.0 K/uL    Monocytes Absolute 1.0  0.1 - 1.0 K/uL    Eosinophils Absolute 0.0  0.0 - 0.7 K/uL    Basophils Absolute 0.0  0.0 - 0.1 K/uL    WBC Morphology FEW NEUTROPHIL BANDS PRESNT      Smear Review LARGE PLATELETS PRESENT     BASIC METABOLIC PANEL     Status: Abnormal   Collection Time   03/18/12  8:38 PM  Component Value Range Comment   Sodium 132 (*) 135 - 145 mEq/L    Potassium 4.6  3.5 - 5.1 mEq/L    Chloride 88 (*) 96 - 112 mEq/L    CO2 16 (*) 19 - 32 mEq/L    Glucose, Bld 249 (*) 70 - 99 mg/dL    BUN 45 (*) 6 - 23 mg/dL    Creatinine, Ser 0.98 (*) 0.50 - 1.10 mg/dL    Calcium 11.9  8.4 - 10.5 mg/dL    GFR calc non Af Amer 26 (*) >90 mL/min    GFR calc Af Amer 30 (*) >90 mL/min   URINALYSIS, ROUTINE W REFLEX MICROSCOPIC     Status: Abnormal   Collection Time   03/18/12  8:39 PM      Component Value Range Comment   Color, Urine YELLOW  YELLOW    APPearance CLOUDY (*) CLEAR    Specific Gravity, Urine 1.027  1.005 - 1.030    pH 5.0  5.0 - 8.0    Glucose, UA >1000 (*) NEGATIVE mg/dL    Hgb urine dipstick LARGE (*) NEGATIVE    Bilirubin Urine MODERATE (*) NEGATIVE    Ketones, ur 40 (*) NEGATIVE mg/dL    Protein, ur 30 (*) NEGATIVE mg/dL    Urobilinogen, UA 0.2  0.0 - 1.0 mg/dL    Nitrite NEGATIVE  NEGATIVE     Leukocytes, UA SMALL (*) NEGATIVE   URINE MICROSCOPIC-ADD ON     Status: Normal   Collection Time   03/18/12  8:39 PM      Component Value Range Comment   Squamous Epithelial / LPF RARE  RARE    WBC, UA 11-20  <3 WBC/hpf    RBC / HPF 11-20  <3 RBC/hpf    Bacteria, UA RARE  RARE    Ct Abdomen Pelvis Wo Contrast  03/18/2012  *RADIOLOGY REPORT*  Clinical Data: Bilateral flank pain.  Nausea.  CT ABDOMEN AND PELVIS WITHOUT CONTRAST  Technique:  Multidetector CT imaging of the abdomen and pelvis was performed following the standard protocol without intravenous contrast.  Comparison: None.  Findings: The liver and spleen have normal uninfused features.  The stomach, pancreas, and adrenal glands are unremarkable.  Large duodenal diverticulum noted.  Gallbladder is surgically absent.  The right kidney is atrophic but otherwise unremarkable.  Multiple stones are seen in the left kidney.  The largest is in the renal pelvis and measures 11 x 12 x 7 mm.  A second stone in the interpolar region measures 8 x 12 mm.  A third 5 mm stone is seen towards the lower pole.  Multiple areas of gas are seen with in the collecting system of the left kidney, adjacent to and abutting the larger stones.  There is also some free gas in the perirenal fat around the left kidney.  No abdominal aortic aneurysm.  No free fluid or lymphadenopathy in the abdomen.  Imaging through the pelvis shows no free intraperitoneal fluid.  No pelvic sidewall lymphadenopathy.  Uterus is unremarkable.  There is no adnexal mass.  Tiny amount of gas is seen within the urinary bladder.  Scattered diverticular changes are seen in the left colon without diverticulitis.  Terminal ileum and appendix are normal.  Bone windows reveal no worrisome lytic or sclerotic osseous lesions.  IMPRESSION: Several large stones in the left intrarenal collecting system with gas seen in the collecting system around the stones and also in the retroperitoneal fat anterior to the  kidney.  These changes are associated with a small amount of gas in the urinary bladder. 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  I cannot identify no definite gas within the renal parenchyma to suggest emphysematous pyelonephritis.  There is not a substantial amount of edema or fluid around the left kidney or left collecting system.  I personally discussed these results by telephone with Dr. Everlene Other at 1850 hours on 03/18/2012.  Original Report Authenticated By: ERIC A. MANSELL, M.D.    Review of Systems  Constitutional: Negative.   HENT: Negative.   Eyes: Negative.   Respiratory: Negative.   Cardiovascular: Negative.   Gastrointestinal: Positive for nausea, vomiting and abdominal pain.  Genitourinary: Negative.   Musculoskeletal: Negative.   Skin: Negative.   Neurological: Negative.   Endo/Heme/Allergies: Negative.   Psychiatric/Behavioral: Negative.     Blood pressure 126/48, pulse 70, temperature 99.3 F (37.4 C), temperature source Oral, resp. rate 20, SpO2 98.00%. Physical Exam  Constitutional: She is oriented to person, place, and time. She appears well-developed and well-nourished. No distress.  HENT:  Head: Normocephalic and atraumatic.  Right Ear: External ear normal.  Left Ear: External ear normal.  Nose: Nose normal.  Mouth/Throat: Oropharynx is clear and moist. No oropharyngeal exudate.  Eyes: Conjunctivae are normal. Pupils are equal, round, and reactive to light. Right eye exhibits no discharge. Left eye exhibits no discharge. No scleral icterus.  Neck: Normal range of motion. Neck supple.  Cardiovascular: Normal rate and regular rhythm.   Respiratory: Effort normal and breath sounds normal. No respiratory distress. She has no wheezes. She has no rales.  GI: Soft. Bowel sounds are normal. She exhibits no distension. There is no tenderness. There is no  rebound and no guarding.  Musculoskeletal: Normal range of motion. She exhibits no edema and no tenderness.  Neurological: She is alert and oriented to person, place, and time.       Moves all extremities.  Skin: Skin is warm and dry. No rash noted. She is not diaphoretic. No erythema.  Psychiatric: Her behavior is normal.     Assessment/Plan #1. Complicated pyelonephritis with gas and obstructing stone - Dr. Mena Goes urologist on call has already discussed with interventional radiology for nephrostomy tube placement. Patient will be placed on vancomycin and Primaxin for broad-spectrum antibiotic coverage. Aggressively hydrate. At this time we will admit patient to step down unit as patient has potential to get septic. Blood cultures and urine cultures has been ordered along with procalcitonin and lactic acid levels. #2. Metabolic acidosis - probably from Developing infection and dehydration and uncontrolled diabetes. Aggressively hydrate. Recheck metabolic panel in a few hours. ABG has been ordered.  #3. Diabetes mellitus type 2 - I have placed patient on sliding-scale coverage with aggressive IV fluid hydration. If the repeat metabolic panel does not show improvement in anion gap then we may have to start IV insulin. #4. History of hypertension - continue present medication. #5. History of hyperlipidemia.  CODE STATUS - full code.  Opel Lejeune N. 03/19/2012, 12:21 AM

## 2012-03-19 NOTE — Progress Notes (Signed)
Utilization Review Completed.  Cambelle Suchecki, Juliaetta T  03/19/2012

## 2012-03-19 NOTE — Progress Notes (Addendum)
[facility]  Interim history: 61 year-old female with known history of diabetes mellitus2, hypertension, hyperlipidemia has been experiencing left flank pain over the last 3 days which has been worsening and associated with nausea and vomiting and subjective feeling of fever and chills. She had gone to her PCP on 6/24 and a CT scan of the abdomen and pelvis was ordered which showed complicated left-sided pyelonephritis- she was referred to the ER.  Dr Mena Goes requested IR to place a Nephrostomy tube which was done 1 AM on 6/25.    Consultants: Urology, IR  Antibiotics: Imipenam  Subjective: She states her pain is significantly improved and she is tolerating clear liquids very well. She tells me her BP dropped to the 70s systolic early this AM from IV pain medications. She also tells me that she has a history of kidney stones and when her pain started a few days ago, she suspected she was passing a stone.   Objective: Blood pressure 110/52, pulse 69, temperature 97.9 F (36.6 C), temperature source Axillary, resp. rate 16, height 5\' 4"  (1.626 m), weight 110.2 kg (242 lb 15.2 oz), SpO2 98.00%. Weight change:   Intake/Output Summary (Last 24 hours) at 03/19/12 0934 Last data filed at 03/19/12 0630  Gross per 24 hour  Intake 1470.5 ml  Output      0 ml  Net 1470.5 ml    Physical Exam: Pt examined Left Nephrostomy tube in place draining clear yellow urine  Lab Results:  [facility] 03/19/12 0307 03/18/12 2038  NA 134* 132*  K 5.0 4.6  CL 88* 88*  CO2 18* 16*  GLUCOSE 291* 249*  BUN 52* 45*  CREATININE 2.14* 1.99*  CALCIUM 10.1 10.1  MG -- --  PHOS -- --    Basename 03/19/12 0307  AST 23  ALT 28  ALKPHOS 78  BILITOT 0.3  PROT 7.7  ALBUMIN 2.7*   No results found for this basename: LIPASE:2,AMYLASE:2 in the last 72 hours  Basename 03/19/12 0307 03/18/12 2038  WBC 17.9* 16.7*  NEUTROABS -- 14.4*  HGB 12.7 12.4  HCT 37.8 37.5  MCV 88.3 88.2  PLT 239 228    No results found for this basename: CKTOTAL:3,CKMB:3,CKMBINDEX:3,TROPONINI:3 in the last 72 hours No components found with this basename: POCBNP:3 No results found for this basename: DDIMER:2 in the last 72 hours No results found for this basename: HGBA1C:2 in the last 72 hours No results found for this basename: CHOL:2,HDL:2,LDLCALC:2,TRIG:2,CHOLHDL:2,LDLDIRECT:2 in the last 72 hours No results found for this basename: TSH,T4TOTAL,FREET3,T3FREE,THYROIDAB in the last 72 hours No results found for this basename: VITAMINB12:2,FOLATE:2,FERRITIN:2,TIBC:2,IRON:2,RETICCTPCT:2 in the last 72 hours  Micro Results: Recent Results (from the past 240 hour(s))  MRSA PCR SCREENING     Status: Normal   Collection Time   03/19/12  2:59 AM      Component Value Range Status Comment   MRSA by PCR NEGATIVE  NEGATIVE Final     Studies/Results: Ct Abdomen Pelvis Wo Contrast  03/18/2012  *RADIOLOGY REPORT*  Clinical Data: Bilateral flank pain.  Nausea.  CT ABDOMEN AND PELVIS WITHOUT CONTRAST  Technique:  Multidetector CT imaging of the abdomen and pelvis was performed following the standard protocol without intravenous contrast.  Comparison: None.  Findings: The liver and spleen have normal uninfused features.  The stomach, pancreas, and adrenal glands are unremarkable.  Large duodenal diverticulum noted.  Gallbladder is surgically absent.  The right kidney is atrophic but otherwise unremarkable.  Multiple stones are seen in the left kidney.  The largest is in the renal pelvis and measures 11 x 12 x 7 mm.  A second stone in the interpolar region measures 8 x 12 mm.  A third 5 mm stone is seen towards the lower pole.  Multiple areas of gas are seen with in the collecting system of the left kidney, adjacent to and abutting the larger stones.  There is also some free gas in the perirenal fat around the left kidney.  No abdominal aortic aneurysm.  No free fluid or lymphadenopathy in the abdomen.  Imaging through the  pelvis shows no free intraperitoneal fluid.  No pelvic sidewall lymphadenopathy.  Uterus is unremarkable.  There is no adnexal mass.  Tiny amount of gas is seen within the urinary bladder.  Scattered diverticular changes are seen in the left colon without diverticulitis.  Terminal ileum and appendix are normal.  Bone windows reveal no worrisome lytic or sclerotic osseous lesions.  IMPRESSION: Several large stones in the left intrarenal collecting system with gas seen in the collecting system around the stones and also in the retroperitoneal fat anterior to the kidney.  These changes are associated with a small amount of gas in the urinary bladder. 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  I cannot identify no definite gas within the renal parenchyma to suggest emphysematous pyelonephritis.  There is not a substantial amount of edema or fluid around the left kidney or left collecting system.  I personally discussed these results by telephone with Dr. Everlene Other at 1850 hours on 03/18/2012.  Original Report Authenticated By: ERIC A. MANSELL, M.D.   Ir Perc Nephrostomy Left  03/19/2012  *RADIOLOGY REPORT*  Clinical Data:Left UPJ calculus with gas in the collecting system suggesting emphysematous pyonephrosis  LEFT PERCUTANEOUS NEPHROSTOMY CATHETER PLACEMENT UNDER ULTRASOUND AND FLUOROSCOPIC GUIDANCE  Technique: The procedure, risks (including but not limited to bleeding, infection, organ damage), benefits, and alternatives were explained to the patient.  Questions regarding the procedure were encouraged and answered.  The patient understands and consents to the procedure.The leftflank region prepped with Betadine, draped in usual sterile fashion, infiltrated locally with 1% lidocaine.The patient was already receiving appropriate antibiotic coverage.  Intravenous Fentanyl and Versed were administered as  conscious sedation during continuous cardiorespiratory monitoring by the radiology RN, with a total moderate sedation time of 14 minutes.   Under real-time ultrasound guidance, a 21-gauge trocar needle was advanced into a posterior mid pole calyx which contained some dirty shadowing presumably gas as seen on CT. Ultrasound image documentation was saved. A 0.018 guide wire advanced smoothly to the UPJ stone which was fluoroscopically evident.  Needle was exchanged over  guidewire for transitional dilator. Contrast injection confirmed appropriate positioning. There is a bifid renal collecting system. Catheter was exchanged over a guidewire for a 10 French Dawson-Mueller pigtail catheter, formed centrally within the left renal collecting system. Contrast injection confirms appropriate positioning and patency. Catheter   secured externally with 0 Prolene suture and Statlock and placed to external drain bag. No immediate complication.  IMPRESSION Technically successful left percutaneous nephrostomy catheter placement.  Original Report Authenticated By: Osa Craver, M.D.   Ir US Guide Bx Asp/drain  03/19/2012  *RADIOLOGY REPORT*  Clinical Data:Left UPJ calculus with gas in the collecting system suggesting emphysematous pyonephrosis  LEFT PERCUTANEOUS NEPHROSTOMY CATHETER PLACEMENT UNDER ULTRASOUND AND FLUOROSCOPIC GUIDANCE  Technique: The procedure, risks (including but not limited to bleeding, infection, organ  damage), benefits, and alternatives were explained to the patient.  Questions regarding the procedure were encouraged and answered.  The patient understands and consents to the procedure.The leftflank region prepped with Betadine, draped in usual sterile fashion, infiltrated locally with 1% lidocaine.The patient was already receiving appropriate antibiotic coverage.  Intravenous Fentanyl and Versed were administered as conscious sedation during continuous cardiorespiratory monitoring by the radiology  RN, with a total moderate sedation time of 14 minutes.   Under real-time ultrasound guidance, a 21-gauge trocar needle was advanced into a posterior mid pole calyx which contained some dirty shadowing presumably gas as seen on CT. Ultrasound image documentation was saved. A 0.018 guide wire advanced smoothly to the UPJ stone which was fluoroscopically evident.  Needle was exchanged over  guidewire for transitional dilator. Contrast injection confirmed appropriate positioning. There is a bifid renal collecting system. Catheter was exchanged over a guidewire for a 10 French Dawson-Mueller pigtail catheter, formed centrally within the left renal collecting system. Contrast injection confirms appropriate positioning and patency. Catheter   secured externally with 0 Prolene suture and Statlock and placed to external drain bag. No immediate complication.  IMPRESSION Technically successful left percutaneous nephrostomy catheter placement.  Original Report Authenticated By: Osa Craver, M.D.   Mm Digital Screening  03/13/2012  *RADIOLOGY REPORT*  Clinical Data: Screening.  DIGITAL DIAGNOSTIC SCREENING MAMMOGRAM WITH CAD  Comparison:  Previous exams  Findings:  There are scattered fibroglandular densities. No suspicious masses, architectural distortion, or calcifications are present.  Images were processed with CAD.  IMPRESSION: No specific mammographic evidence of malignancy.  A result letter of this screening mammogram will be mailed directly to the patient.  RECOMMENDATION: Screening mammogram in one year. (Code:SM-B-01Y)  BI-RADS CATEGORY 1:  Negative  Original Report Authenticated By: Rolla Plate, M.D.    Medications: Scheduled Meds:   . sodium chloride   Intravenous Once  . cefTRIAXone (ROCEPHIN)  IV  1 g Intravenous Once  . fentaNYL      . fluticasone  1 spray Each Nare Daily  . imipenem-cilastatin  250 mg Intravenous Q6H  . imipenem-cilastatin  500 mg Intravenous Once  . insulin aspart   0-15 Units Subcutaneous TID WC  . insulin aspart  0-5 Units Subcutaneous QHS  . midazolam      .  morphine injection  4 mg Intravenous Once  . nebivolol  20 mg Oral Daily  . ondansetron (ZOFRAN) IV  4 mg Intravenous Once  . ondansetron (ZOFRAN) IV  4 mg Intravenous Once  . sodium chloride  500 mL Intravenous Once  . sodium chloride  3 mL Intravenous Q12H  . vancomycin  1,500 mg Intravenous Q24H  . vancomycin  1,000 mg Intravenous Once  . DISCONTD: nebivolol  20 mg Oral Daily  . DISCONTD: promethazine  12.5 mg Intravenous Once  . DISCONTD: promethazine  25 mg Intramuscular Once   Continuous Infusions:   . sodium chloride 150 mL/hr (03/19/12 0303)   PRN Meds:.acetaminophen, acetaminophen, fentaNYL, HYDROcodone-acetaminophen, HYDROmorphone (DILAUDID) injection, iohexol, midazolam, ondansetron (ZOFRAN) IV, ondansetron  Assessment/Plan: Principal Problem:  *Sepsis due to Pyelonephritis Will follow Urology recommendations  Will need to narrow antibiotics based on culture and likely continue for 2 weeks.    DM2 (diabetes mellitus, type 2) Sensitive sliding scale for now as we don't know how well she will eat and whether she will vomit again - she usually takes regular insulin- 14 u at 10 AM and 5 U at bedtime.  In addition she is on Invokana  which we will also hold for now. Have asked for a repeat med reconciliation to add on this med.    Hypotension/ h/o HTN Holding ARB/ HCTZ Holding parameters placed on Bystolic  Dehydration/ vomiting Advance diet to carb modified- regular, cont NS at 150 cc/hr for today   Renal failure- suspect this is acute on chronic due to current issues She states she has "kidney problems" however, we do not have a baseline Cr to compare with.  - continue aggressive hydration for now due to significant dehydration    Code Status: Full Family Communication: Husband at bedside Disposition: cont to follow in SDU, When BP is stable, would be ready for  transfer.   [facility] [phone] 03/19/2012, 9:34 AM  LOS: 1 day

## 2012-03-20 ENCOUNTER — Inpatient Hospital Stay (HOSPITAL_COMMUNITY): Payer: 59

## 2012-03-20 ENCOUNTER — Encounter (HOSPITAL_COMMUNITY): Payer: Self-pay | Admitting: Radiology

## 2012-03-20 DIAGNOSIS — E1165 Type 2 diabetes mellitus with hyperglycemia: Secondary | ICD-10-CM

## 2012-03-20 DIAGNOSIS — E872 Acidosis: Secondary | ICD-10-CM

## 2012-03-20 DIAGNOSIS — R6510 Systemic inflammatory response syndrome (SIRS) of non-infectious origin without acute organ dysfunction: Secondary | ICD-10-CM

## 2012-03-20 DIAGNOSIS — R5381 Other malaise: Secondary | ICD-10-CM

## 2012-03-20 LAB — CBC
HCT: 32.5 % — ABNORMAL LOW (ref 36.0–46.0)
MCH: 28.7 pg (ref 26.0–34.0)
MCV: 87.1 fL (ref 78.0–100.0)
RBC: 3.73 MIL/uL — ABNORMAL LOW (ref 3.87–5.11)
WBC: 11.9 10*3/uL — ABNORMAL HIGH (ref 4.0–10.5)

## 2012-03-20 LAB — BASIC METABOLIC PANEL
BUN: 46 mg/dL — ABNORMAL HIGH (ref 6–23)
CO2: 25 mEq/L (ref 19–32)
Calcium: 8.6 mg/dL (ref 8.4–10.5)
Chloride: 98 mEq/L (ref 96–112)
Creatinine, Ser: 1.7 mg/dL — ABNORMAL HIGH (ref 0.50–1.10)
Glucose, Bld: 257 mg/dL — ABNORMAL HIGH (ref 70–99)

## 2012-03-20 LAB — GLUCOSE, CAPILLARY
Glucose-Capillary: 198 mg/dL — ABNORMAL HIGH (ref 70–99)
Glucose-Capillary: 294 mg/dL — ABNORMAL HIGH (ref 70–99)
Glucose-Capillary: 252 mg/dL — ABNORMAL HIGH (ref 70–99)

## 2012-03-20 MED ORDER — INSULIN ASPART 100 UNIT/ML ~~LOC~~ SOLN
3.0000 [IU] | Freq: Three times a day (TID) | SUBCUTANEOUS | Status: DC
  Administered 2012-03-21 – 2012-03-22 (×5): 3 [IU] via SUBCUTANEOUS

## 2012-03-20 MED ORDER — SODIUM CHLORIDE 0.9 % IJ SOLN
10.0000 mL | Freq: Two times a day (BID) | INTRAMUSCULAR | Status: DC
  Administered 2012-03-21: 13 mL
  Administered 2012-03-22: 10 mL

## 2012-03-20 MED ORDER — CANAGLIFLOZIN 100 MG PO TABS
100.0000 mg | ORAL_TABLET | Freq: Every day | ORAL | Status: DC
  Administered 2012-03-21: 100 mg via ORAL
  Administered 2012-03-22: 10:00:00 via ORAL
  Filled 2012-03-20: qty 1

## 2012-03-20 MED ORDER — ALUM & MAG HYDROXIDE-SIMETH 200-200-20 MG/5ML PO SUSP
30.0000 mL | ORAL | Status: DC | PRN
  Administered 2012-03-20 – 2012-03-21 (×3): 30 mL via ORAL
  Filled 2012-03-20 (×3): qty 30

## 2012-03-20 MED ORDER — INSULIN ASPART 100 UNIT/ML ~~LOC~~ SOLN
0.0000 [IU] | Freq: Three times a day (TID) | SUBCUTANEOUS | Status: DC
  Administered 2012-03-20: 11 [IU] via SUBCUTANEOUS
  Administered 2012-03-21: 4 [IU] via SUBCUTANEOUS
  Administered 2012-03-21 – 2012-03-22 (×4): 7 [IU] via SUBCUTANEOUS

## 2012-03-20 MED ORDER — INSULIN GLARGINE 100 UNIT/ML ~~LOC~~ SOLN
10.0000 [IU] | Freq: Every day | SUBCUTANEOUS | Status: DC
  Administered 2012-03-20 – 2012-03-21 (×2): 10 [IU] via SUBCUTANEOUS

## 2012-03-20 MED ORDER — INSULIN ASPART 100 UNIT/ML ~~LOC~~ SOLN
0.0000 [IU] | Freq: Every day | SUBCUTANEOUS | Status: DC
  Administered 2012-03-21: 3 [IU] via SUBCUTANEOUS

## 2012-03-20 MED ORDER — SODIUM CHLORIDE 0.9 % IJ SOLN
10.0000 mL | INTRAMUSCULAR | Status: DC | PRN
  Administered 2012-03-21: 10 mL

## 2012-03-20 MED ORDER — CANAGLIFLOZIN 100 MG PO TABS: 100.0000 mg | ORAL_TABLET | Freq: Every day | ORAL | Status: DC

## 2012-03-20 NOTE — Progress Notes (Signed)
[facility] TEAM 1 - Stepdown/ICU TEAM  Interim history: 61 year-old female with known history of diabetes mellitus 2, hypertension, hyperlipidemia has been experiencing left flank pain over the last 3 days which has been associated with nausea and vomiting and subjective feeling of fever and chills. She had gone to her PCP on 6/24 and a CT scan of the abdomen and pelvis was ordered which showed complicated left-sided pyelonephritis- she was referred to the ER.  Urologist Dr Mena Goes requested IR to place a Nephrostomy tube which was done 1 AM on 6/25.  Subjective: The pt is feeling much better today.  She reports some nausea, but no vomiting.  Her appetite is improving.  She denies f/c, sob, cp, or ha.    Objective: Blood pressure 144/61, pulse 66, temperature 98.4 F (36.9 C), temperature source Oral, resp. rate 18, height 5\' 4"  (1.626 m), weight 112.3 kg (247 lb 9.2 oz), SpO2 98.00%. Weight change: 2.1 kg (4 lb 10.1 oz)  Intake/Output Summary (Last 24 hours) at 03/20/12 1158 Last data filed at 03/20/12 0900  Gross per 24 hour  Intake   4617 ml  Output   2125 ml  Net   2492 ml   Physical Exam: General: No acute respiratory distress Lungs: Clear to auscultation bilaterally without wheezes or crackles Cardiovascular: Regular rate and rhythm without murmur gallop or rub  Abdomen: obese, nontender, nondistended, soft, bowel sounds positive, no rebound, no ascites, no appreciable mass - L frank perc-neph tube in place w/ dressing clean and dry Extremities: No significant cyanosis, clubbing, or edema bilateral lower extremities  Lab Results:  [facility] 03/20/12 0416 03/19/12 0307  NA 136 134*  K 4.1 5.0  CL 98 88*  CO2 25 18*  GLUCOSE 257* 291*  BUN 46* 52*  CREATININE 1.70* 2.14*  CALCIUM 8.6 10.1  MG -- --  PHOS -- --    Basename 03/19/12 0307  AST 23  ALT 28  ALKPHOS 78  BILITOT 0.3  PROT 7.7  ALBUMIN 2.7*    Basename 03/20/12 0416 03/19/12 0307  03/18/12 2038  WBC 11.9* 17.9* --  NEUTROABS -- -- 14.4*  HGB 10.7* 12.7 --  HCT 32.5* 37.8 --  MCV 87.1 88.3 --  PLT 237 239 --   Micro Results: Recent Results (from the past 240 hour(s))  URINE CULTURE     Status: Normal (Preliminary result)   Collection Time   03/18/12  8:39 PM      Component Value Range Status Comment   Specimen Description URINE, CLEAN CATCH   Final    Special Requests NONE   Final    Culture  Setup Time [id number]   Final    Colony Count PENDING   Incomplete    Culture Culture reincubated for better growth   Final    Report Status PENDING   Incomplete   CULTURE, BLOOD (ROUTINE X 2)     Status: Normal (Preliminary result)   Collection Time   03/18/12 11:51 PM      Component Value Range Status Comment   Specimen Description BLOOD LEFT HAND   Final    Special Requests BOTTLES DRAWN AEROBIC ONLY 1CC   Final    Culture  Setup Time [id number]   Final    Culture     Final    Value:        BLOOD CULTURE RECEIVED NO GROWTH TO DATE CULTURE WILL BE HELD FOR 5 DAYS BEFORE ISSUING A FINAL NEGATIVE REPORT  Report Status PENDING   Incomplete   CULTURE, BLOOD (ROUTINE X 2)     Status: Normal (Preliminary result)   Collection Time   03/18/12 11:55 PM      Component Value Range Status Comment   Specimen Description BLOOD LEFT ARM   Final    Special Requests BOTTLES DRAWN AEROBIC ONLY 1CC   Final    Culture  Setup Time [id number]   Final    Culture     Final    Value:        BLOOD CULTURE RECEIVED NO GROWTH TO DATE CULTURE WILL BE HELD FOR 5 DAYS BEFORE ISSUING A FINAL NEGATIVE REPORT   Report Status PENDING   Incomplete   MRSA PCR SCREENING     Status: Normal   Collection Time   03/19/12  2:59 AM      Component Value Range Status Comment   MRSA by PCR NEGATIVE  NEGATIVE Final     Studies/Results: Reviewed by MD  Medications: Reviewed by MD  Assessment/Plan:  Sepsis with acute complicated Pyelonephritis and emphysematous  Pyonephrosis Now s/p perc neph  tube on L - continue abx for prolonged course (2 weeks) - following cultures  Nephrolithiasis/L UPJ stone Urology is following - s/p perc neph on L  DM2 (diabetes mellitus, type 2) she is on Invokana and asks that we resume this - I feel this is acceptable at this time - will also continue SSI and follow CBG as diet is advanced  Hypotension/ h/o HTN BP is rebounding - cont to hold ACE/ARB due to renal failure - watch BP trend   Dehydration/ vomiting Appears to be resolved - advancing oral diet  Acute renal failure She states she has "kidney problems" however, we do not have a baseline Cr to compare with - pt now hydrated - follow renal function - is improving s/p perc neph   Dispo Stable for transfer to medical bed - pt requests PICC for blood draws (current IV needs to be changed)  Code Status: Full  03/20/2012, 11:58 AM  LOS: 2 days   Lonia Blood, MD [facility] Office  [phone] Pager [phone]  On-Call/Text Page:      [email or web address]      password [facility]

## 2012-03-20 NOTE — Progress Notes (Signed)
Peripherally Inserted Central Catheter/Midline Placement  The IV Nurse has discussed with the patient and/or persons authorized to consent for the patient, the purpose of this procedure and the potential benefits and risks involved with this procedure.  The benefits include less needle sticks, lab draws from the catheter and patient may be discharged home with the catheter.  Risks include, but not limited to, infection, bleeding, blood clot (thrombus formation), and puncture of an artery; nerve damage and irregular heat beat.  Alternatives to this procedure were also discussed.  PICC/Midline Placement Documentation        Kristine Gray 03/20/2012, 3:12 PM

## 2012-03-20 NOTE — Progress Notes (Signed)
Subjective: Left PCN placed 6/25 early am Pyelonephritis; renal stone Pt doing well Feels better   Objective: Vital signs in last 24 hours: Temp:  [97.9 F (36.6 C)-98.4 F (36.9 C)] 98.4 F (36.9 C) (06/26 1140) Pulse Rate:  [66-85] 66  (06/26 1140) Resp:  [16-20] 18  (06/26 1140) BP: (117-151)/(57-73) 144/61 mmHg (06/26 1140) SpO2:  [93 %-99 %] 98 % (06/26 1140) Weight:  [247 lb 9.2 oz (112.3 kg)] 247 lb 9.2 oz (112.3 kg) (06/25 2335) Last BM Date: 03/18/12  Intake/Output from previous day: 06/25 0701 - 06/26 0700 In: 3517 [I.V.:3065; IV Piggyback:202] Out: 2800 [Urine:2125; Drains:675] Intake/Output this shift: Total I/O In: 1575 [P.O.:1000; Other:575] Out: 300 [Urine:300]  PE:  Afeb; VSS Output 100 cc in bag now; clear yellow ?? Output since placement( not documented correctly) Bun/Cr 5.2/2.1; 46/1.7- 6/26 Wbc 17.9 to 11.9 Site clean and dry; sl tender No sign of infection   Lab Results:   [facility] 03/20/12 0416 03/19/12 0307  WBC 11.9* 17.9*  HGB 10.7* 12.7  HCT 32.5* 37.8  PLT 237 239   BMET  Basename 03/20/12 0416 03/19/12 0307  NA 136 134*  K 4.1 5.0  CL 98 88*  CO2 25 18*  GLUCOSE 257* 291*  BUN 46* 52*  CREATININE 1.70* 2.14*  CALCIUM 8.6 10.1   PT/INR No results found for this basename: LABPROT:2,INR:2 in the last 72 hours ABG  Basename 03/19/12 0048  PHART 7.370  HCO3 18.7*    Studies/Results: Ct Abdomen Pelvis Wo Contrast  03/18/2012  *RADIOLOGY REPORT*  Clinical Data: Bilateral flank pain.  Nausea.  CT ABDOMEN AND PELVIS WITHOUT CONTRAST  Technique:  Multidetector CT imaging of the abdomen and pelvis was performed following the standard protocol without intravenous contrast.  Comparison: None.  Findings: The liver and spleen have normal uninfused features.  The stomach, pancreas, and adrenal glands are unremarkable.  Large duodenal diverticulum noted.  Gallbladder is surgically absent.  The right kidney is atrophic but otherwise  unremarkable.  Multiple stones are seen in the left kidney.  The largest is in the renal pelvis and measures 11 x 12 x 7 mm.  A second stone in the interpolar region measures 8 x 12 mm.  A third 5 mm stone is seen towards the lower pole.  Multiple areas of gas are seen with in the collecting system of the left kidney, adjacent to and abutting the larger stones.  There is also some free gas in the perirenal fat around the left kidney.  No abdominal aortic aneurysm.  No free fluid or lymphadenopathy in the abdomen.  Imaging through the pelvis shows no free intraperitoneal fluid.  No pelvic sidewall lymphadenopathy.  Uterus is unremarkable.  There is no adnexal mass.  Tiny amount of gas is seen within the urinary bladder.  Scattered diverticular changes are seen in the left colon without diverticulitis.  Terminal ileum and appendix are normal.  Bone windows reveal no worrisome lytic or sclerotic osseous lesions.  IMPRESSION: Several large stones in the left intrarenal collecting system with gas seen in the collecting system around the stones and also in the retroperitoneal fat anterior to the kidney.  These changes are associated with a small amount of gas in the urinary bladder. 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  I cannot identify no definite gas within the renal parenchyma to suggest emphysematous pyelonephritis.  There is not a  substantial amount of edema or fluid around the left kidney or left collecting system.  I personally discussed these results by telephone with Dr. Everlene Other at 1850 hours on 03/18/2012.  Original Report Authenticated By: ERIC A. MANSELL, M.D.   Ir Perc Nephrostomy Left  03/19/2012  *RADIOLOGY REPORT*  Clinical Data:Left UPJ calculus with gas in the collecting system suggesting emphysematous pyonephrosis  LEFT PERCUTANEOUS NEPHROSTOMY CATHETER PLACEMENT UNDER  ULTRASOUND AND FLUOROSCOPIC GUIDANCE  Technique: The procedure, risks (including but not limited to bleeding, infection, organ damage), benefits, and alternatives were explained to the patient.  Questions regarding the procedure were encouraged and answered.  The patient understands and consents to the procedure.The leftflank region prepped with Betadine, draped in usual sterile fashion, infiltrated locally with 1% lidocaine.The patient was already receiving appropriate antibiotic coverage.  Intravenous Fentanyl and Versed were administered as conscious sedation during continuous cardiorespiratory monitoring by the radiology RN, with a total moderate sedation time of 14 minutes.   Under real-time ultrasound guidance, a 21-gauge trocar needle was advanced into a posterior mid pole calyx which contained some dirty shadowing presumably gas as seen on CT. Ultrasound image documentation was saved. A 0.018 guide wire advanced smoothly to the UPJ stone which was fluoroscopically evident.  Needle was exchanged over  guidewire for transitional dilator. Contrast injection confirmed appropriate positioning. There is a bifid renal collecting system. Catheter was exchanged over a guidewire for a 10 French Dawson-Mueller pigtail catheter, formed centrally within the left renal collecting system. Contrast injection confirms appropriate positioning and patency. Catheter   secured externally with 0 Prolene suture and Statlock and placed to external drain bag. No immediate complication.  IMPRESSION Technically successful left percutaneous nephrostomy catheter placement.  Original Report Authenticated By: Osa Craver, M.D.   Ir US Guide Bx Asp/drain  03/19/2012  *RADIOLOGY REPORT*  Clinical Data:Left UPJ calculus with gas in the collecting system suggesting emphysematous pyonephrosis  LEFT PERCUTANEOUS NEPHROSTOMY CATHETER PLACEMENT UNDER ULTRASOUND AND FLUOROSCOPIC GUIDANCE  Technique: The procedure, risks (including but  not limited to bleeding, infection, organ damage), benefits, and alternatives were explained to the patient.  Questions regarding the procedure were encouraged and answered.  The patient understands and consents to the procedure.The leftflank region prepped with Betadine, draped in usual sterile fashion, infiltrated locally with 1% lidocaine.The patient was already receiving appropriate antibiotic coverage.  Intravenous Fentanyl and Versed were administered as conscious sedation during continuous cardiorespiratory monitoring by the radiology RN, with a total moderate sedation time of 14 minutes.   Under real-time ultrasound guidance, a 21-gauge trocar needle was advanced into a posterior mid pole calyx which contained some dirty shadowing presumably gas as seen on CT. Ultrasound image documentation was saved. A 0.018 guide wire advanced smoothly to the UPJ stone which was fluoroscopically evident.  Needle was exchanged over  guidewire for transitional dilator. Contrast injection confirmed appropriate positioning. There is a bifid renal collecting system. Catheter was exchanged over a guidewire for a 10 French Dawson-Mueller pigtail catheter, formed centrally within the left renal collecting system. Contrast injection confirms appropriate positioning and patency. Catheter   secured externally with 0 Prolene suture and Statlock and placed to external drain bag. No immediate complication.  IMPRESSION Technically successful left percutaneous nephrostomy catheter placement.  Original Report Authenticated By: Osa Craver, M.D.    Anti-infectives: Anti-infectives     Start     Dose/Rate Route Frequency Ordered Stop   03/19/12 0600   imipenem-cilastatin (PRIMAXIN) 250 mg in sodium chloride 0.9 %  100 mL IVPB        250 mg 200 mL/hr over 30 Minutes Intravenous 4 times per day 03/19/12 0330     03/19/12 0400   vancomycin (VANCOCIN) 1,500 mg in sodium chloride 0.9 % 500 mL IVPB        1,500 mg 250 mL/hr  over 120 Minutes Intravenous Every 24 hours 03/19/12 0330     03/18/12 2345   imipenem-cilastatin (PRIMAXIN) 500 mg in sodium chloride 0.9 % 100 mL IVPB        500 mg 200 mL/hr over 30 Minutes Intravenous  Once 03/18/12 2335 03/19/12 0044   03/18/12 2345   vancomycin (VANCOCIN) IVPB 1000 mg/200 mL premix        1,000 mg 200 mL/hr over 60 Minutes Intravenous  Once 03/18/12 2335 03/19/12 0251   03/18/12 2115   cefTRIAXone (ROCEPHIN) 1 g in dextrose 5 % 50 mL IVPB        1 g 100 mL/hr over 30 Minutes Intravenous  Once 03/18/12 2101 03/18/12 2208          Assessment/Plan: s/p L percutaneous nephrostomy placed early am 6/25  Pt better; renal fxn down Wbc down Plan for nephrolithotomy per Urology  RN will remedy output documentation   Rumi Kolodziej A 03/20/2012

## 2012-03-21 ENCOUNTER — Inpatient Hospital Stay (HOSPITAL_COMMUNITY): Payer: 59

## 2012-03-21 DIAGNOSIS — N179 Acute kidney failure, unspecified: Secondary | ICD-10-CM

## 2012-03-21 DIAGNOSIS — A413 Sepsis due to Hemophilus influenzae: Secondary | ICD-10-CM

## 2012-03-21 DIAGNOSIS — R5381 Other malaise: Secondary | ICD-10-CM

## 2012-03-21 DIAGNOSIS — E1165 Type 2 diabetes mellitus with hyperglycemia: Secondary | ICD-10-CM

## 2012-03-21 LAB — GLUCOSE, CAPILLARY
Glucose-Capillary: 176 mg/dL — ABNORMAL HIGH (ref 70–99)
Glucose-Capillary: 202 mg/dL — ABNORMAL HIGH (ref 70–99)
Glucose-Capillary: 298 mg/dL — ABNORMAL HIGH (ref 70–99)

## 2012-03-21 LAB — BASIC METABOLIC PANEL
Calcium: 9.1 mg/dL (ref 8.4–10.5)
Creatinine, Ser: 1.26 mg/dL — ABNORMAL HIGH (ref 0.50–1.10)
GFR calc Af Amer: 52 mL/min — ABNORMAL LOW (ref >90)

## 2012-03-21 LAB — HEMOGLOBIN A1C
Hgb A1c MFr Bld: 8.3 % — ABNORMAL HIGH (ref <5.7)
Mean Plasma Glucose: 192 mg/dL — ABNORMAL HIGH (ref <117)

## 2012-03-21 LAB — URINE CULTURE: Colony Count: >=100000

## 2012-03-21 LAB — CBC
Platelets: 275 10*3/uL (ref 150–400)
RDW: 14.6 % (ref 11.5–15.5)
WBC: 13.1 10*3/uL — ABNORMAL HIGH (ref 4.0–10.5)

## 2012-03-21 MED ORDER — LEVOFLOXACIN IN D5W 750 MG/150ML IV SOLN
750.0000 mg | INTRAVENOUS | Status: DC
  Administered 2012-03-21: 750 mg via INTRAVENOUS
  Filled 2012-03-21 (×2): qty 150

## 2012-03-21 MED ORDER — MAGNESIUM HYDROXIDE 400 MG/5ML PO SUSP
30.0000 mL | ORAL | Status: DC | PRN
  Administered 2012-03-22: 30 mL via ORAL
  Filled 2012-03-21: qty 30

## 2012-03-21 MED ORDER — SODIUM CHLORIDE 0.9 % IV SOLN
500.0000 mg | Freq: Four times a day (QID) | INTRAVENOUS | Status: DC
  Administered 2012-03-21: 500 mg via INTRAVENOUS
  Filled 2012-03-21 (×3): qty 500

## 2012-03-21 MED ORDER — FAMOTIDINE 40 MG PO TABS
40.0000 mg | ORAL_TABLET | Freq: Two times a day (BID) | ORAL | Status: DC | PRN
  Administered 2012-03-22: 40 mg via ORAL
  Filled 2012-03-21 (×2): qty 1

## 2012-03-21 NOTE — ED Provider Notes (Signed)
Medical screening examination/treatment/procedure(s) were conducted as a shared visit with non-physician practitioner(s) and myself.  I personally evaluated the patient during the encounter   Nat Christen, MD 03/21/12 [phone]

## 2012-03-21 NOTE — Progress Notes (Addendum)
  Subjective: Patient reports tolerating po but having heartburn. No emesis.   Objective: Vital signs in last 24 hours: Temp:  [98.4 F (36.9 C)-99.7 F (37.6 C)] 98.8 F (37.1 C) (06/27 0507) Pulse Rate:  [66-70] 70  (06/27 0507) Resp:  [18] 18  (06/27 0507) BP: (133-149)/(60-69) 138/61 mmHg (06/27 0507) SpO2:  [96 %-98 %] 96 % (06/27 0507) Weight:  [114.8 kg (253 lb 1.4 oz)] 114.8 kg (253 lb 1.4 oz) (06/26 2050)  Intake/Output from previous day: 06/26 0701 - 06/27 0700 In: 4792.5 [P.O.:1000; I.V.:2392.5; IV Piggyback:1400] Out: 2460 [Urine:1320; Drains:1140] Intake/Output this shift:    Physical Exam:  General: NAD , sitting on edge of bed eating breakfast LEFT Nx - good UOP - 100 ml in bag 2.5 hrs after being emptied  Lab Results:  Basename 03/21/12 0625 03/20/12 0416 03/19/12 0307  HGB 11.1* 10.7* 12.7  HCT 32.5* 32.5* 37.8   BMET  Basename 03/21/12 0625 03/20/12 0416  NA 139 136  K 3.9 4.1  CL 100 98  CO2 24 25  GLUCOSE 188* 257*  BUN 30* 46*  CREATININE 1.26* 1.70*  CALCIUM 9.1 8.6   No results found for this basename: LABPT:3,INR:3 in the last 72 hours No results found for this basename: LABURIN:1 in the last 72 hours  Urine Cx = E coli   Assessment/Plan:  Sepsis - pyonephrosis s/p LEFT Nephrostomy. WBC down to 13, Cr down to 1.26. Excellent UOP, No fever.  Stable. Continue left nephrostomy as outpatient and F/u with me to plan management of left stones. Patient given card/contact information.    LOS: 3 days   Antony Haste 03/21/2012, 8:22 AM   Add - I should add I will check CT abdomen today.

## 2012-03-21 NOTE — Progress Notes (Signed)
03/21/12  Spoke with patient about her diabetes.  Was diagnosed in 1995. Sees Dr. Ardyth Harps for her diabetes control.  Has been on Regular insulin at home twice a day.  Has tried various regimens over the past few years.  Understands the regimen of Lantus and Novolog here at the hospital. Explained the use of Novolog meal coverage if she eats 50% or more of meals.  Husband is employee of [employer] and has been informed about the [facility].  Will continue to follow while in hospital.

## 2012-03-21 NOTE — Progress Notes (Signed)
  Subjective: Left PCN placed 6/25 Pt so much better Output is great Renal fxs normalizing Plan per Dr Mena Goes: for dc soon and tx for stone as OP  Objective: Vital signs in last 24 hours: Temp:  [98.1 F (36.7 C)-99.7 F (37.6 C)] 98.1 F (36.7 C) (06/27 0925) Pulse Rate:  [66-72] 72  (06/27 0925) Resp:  [17-18] 17  (06/27 0925) BP: (133-152)/(60-69) 152/60 mmHg (06/27 0925) SpO2:  [96 %-99 %] 99 % (06/27 0925) Weight:  [253 lb 1.4 oz (114.8 kg)] 253 lb 1.4 oz (114.8 kg) (06/26 2050) Last BM Date: 03/18/12  Intake/Output from previous day: 06/26 0701 - 06/27 0700 In: 4792.5 [P.O.:1000; I.V.:2392.5; IV Piggyback:1400] Out: 2460 [Urine:1320; Drains:1140] Intake/Output this shift: Total I/O In: 480 [P.O.:480] Out: -   PE:  Up in bed No pian VSS; afeb Output lt PCN: 1.2 liters 6/26 yellow Site clean and dry; nt Wbc 13.1 Bun/cr: 30/1.2 (46/1.7)  Lab Results:   [facility] 03/21/12 0625 03/20/12 0416  WBC 13.1* 11.9*  HGB 11.1* 10.7*  HCT 32.5* 32.5*  PLT 275 237   BMET  Basename 03/21/12 0625 03/20/12 0416  NA 139 136  K 3.9 4.1  CL 100 98  CO2 24 25  GLUCOSE 188* 257*  BUN 30* 46*  CREATININE 1.26* 1.70*  CALCIUM 9.1 8.6   PT/INR No results found for this basename: LABPROT:2,INR:2 in the last 72 hours ABG  Basename 03/19/12 0048  PHART 7.370  HCO3 18.7*    Studies/Results: Dg Chest Port 1 View  03/20/2012  *RADIOLOGY REPORT*  Clinical Data: Confirm line placement.  PORTABLE CHEST - 1 VIEW  Comparison: 10/07/2004.  Findings: Right PICC tip projects over the SVC.  Trachea is midline.  Heart size is accentuated by AP semi upright technique and low lung volumes.  Mild bibasilar atelectasis.  No definite pleural fluid.  IMPRESSION: Low lung volumes with mild bibasilar atelectasis.  Original Report Authenticated By: Reyes Ivan, M.D.    Anti-infectives: Anti-infectives     Start     Dose/Rate Route Frequency Ordered Stop   03/19/12 0600    imipenem-cilastatin (PRIMAXIN) 250 mg in sodium chloride 0.9 % 100 mL IVPB        250 mg 200 mL/hr over 30 Minutes Intravenous 4 times per day 03/19/12 0330     03/19/12 0400   vancomycin (VANCOCIN) 1,500 mg in sodium chloride 0.9 % 500 mL IVPB        1,500 mg 250 mL/hr over 120 Minutes Intravenous Every 24 hours 03/19/12 0330     03/18/12 2345   imipenem-cilastatin (PRIMAXIN) 500 mg in sodium chloride 0.9 % 100 mL IVPB        500 mg 200 mL/hr over 30 Minutes Intravenous  Once 03/18/12 2335 03/19/12 0044   03/18/12 2345   vancomycin (VANCOCIN) IVPB 1000 mg/200 mL premix        1,000 mg 200 mL/hr over 60 Minutes Intravenous  Once 03/18/12 2335 03/19/12 0251   03/18/12 2115   cefTRIAXone (ROCEPHIN) 1 g in dextrose 5 % 50 mL IVPB        1 g 100 mL/hr over 30 Minutes Intravenous  Once 03/18/12 2101 03/18/12 2208          Assessment/Plan:  s/p L PCN placed 6/26  Doing well For CT today per MD Plan to dc home soon and treat stone as OP Per Dr Josue Hector A 03/21/2012

## 2012-03-21 NOTE — Progress Notes (Signed)
ANTIBIOTIC CONSULT NOTE - Follow Up  Pharmacy Consult for vancomycin and imipenem  Indication: pylenephritis/sepsis  Allergies  Allergen Reactions  . Penicillins Hives, Itching and Swelling    Patient Measurements: Height: 5\' 4"  (162.6 cm) Weight: 253 lb 1.4 oz (114.8 kg) IBW/kg (Calculated) : 54.7  Adjusted Body Weight:   Vital Signs: Temp: 98.1 F (36.7 C) (06/27 0925) Temp src: Oral (06/27 0925) BP: 152/60 mmHg (06/27 0925) Pulse Rate: 72  (06/27 0925) Intake/Output from previous day: 06/26 0701 - 06/27 0700 In: 4792.5 [P.O.:1000; I.V.:2392.5; IV Piggyback:1400] Out: 2460 [Urine:1320; Drains:1140] Intake/Output from this shift: Total I/O In: 480 [P.O.:480] Out: 250 [Drains:250]  Labs:  [facility] 03/21/12 0625 03/20/12 0416 03/19/12 0307  WBC 13.1* 11.9* 17.9*  HGB 11.1* 10.7* 12.7  PLT 275 237 239  LABCREA -- -- --  CREATININE 1.26* 1.70* 2.14*   Estimated Creatinine Clearance: 57.5 ml/min (by C-G formula based on Cr of 1.26). No results found for this basename: VANCOTROUGH:2,VANCOPEAK:2,VANCORANDOM:2,GENTTROUGH:2,GENTPEAK:2,GENTRANDOM:2,TOBRATROUGH:2,TOBRAPEAK:2,TOBRARND:2,AMIKACINPEAK:2,AMIKACINTROU:2,AMIKACIN:2, in the last 72 hours   Microbiology: Recent Results (from the past 720 hour(s))  URINE CULTURE     Status: Normal (Preliminary result)   Collection Time   03/18/12  8:39 PM      Component Value Range Status Comment   Specimen Description URINE, CLEAN CATCH   Final    Special Requests NONE   Final    Culture  Setup Time [id number]   Final    Colony Count >=100,000 COLONIES/ML   Final    Culture ESCHERICHIA COLI   Final    Report Status PENDING   Incomplete   CULTURE, BLOOD (ROUTINE X 2)     Status: Normal (Preliminary result)   Collection Time   03/18/12 11:51 PM      Component Value Range Status Comment   Specimen Description BLOOD LEFT HAND   Final    Special Requests BOTTLES DRAWN AEROBIC ONLY 1CC   Final    Culture  Setup Time [id number]    Final    Culture     Final    Value:        BLOOD CULTURE RECEIVED NO GROWTH TO DATE CULTURE WILL BE HELD FOR 5 DAYS BEFORE ISSUING A FINAL NEGATIVE REPORT   Report Status PENDING   Incomplete   CULTURE, BLOOD (ROUTINE X 2)     Status: Normal (Preliminary result)   Collection Time   03/18/12 11:55 PM      Component Value Range Status Comment   Specimen Description BLOOD LEFT ARM   Final    Special Requests BOTTLES DRAWN AEROBIC ONLY 1CC   Final    Culture  Setup Time [id number]   Final    Culture     Final    Value:        BLOOD CULTURE RECEIVED NO GROWTH TO DATE CULTURE WILL BE HELD FOR 5 DAYS BEFORE ISSUING A FINAL NEGATIVE REPORT   Report Status PENDING   Incomplete   MRSA PCR SCREENING     Status: Normal   Collection Time   03/19/12  2:59 AM      Component Value Range Status Comment   MRSA by PCR NEGATIVE  NEGATIVE Final     Medical History: Past Medical History  Diagnosis Date  . Kidney calculi   . Diabetes mellitus   . Hypertension     Medications:  Prescriptions prior to admission  Medication Sig Dispense Refill  . acetaminophen (TYLENOL) 500 MG tablet Take 500 mg by mouth every 6 (six)  hours as needed. For pain      . acetaminophen-codeine (TYLENOL #3) 300-30 MG per tablet Take 1 tablet by mouth every 4 (four) hours as needed. For pain      . aspirin EC 81 MG tablet Take 162 mg by mouth daily.      Marland Kitchen azithromycin (AZASITE) 1 % ophthalmic solution Place 1 drop into both eyes as needed.      Marland Kitchen BLACK COHOSH PO Take 1 tablet by mouth 2 (two) times daily.      . calcium carbonate (OS-CAL) 600 MG TABS Take 1,500 mg by mouth daily.      . Canagliflozin (INVOKANA) 100 MG TABS Take by mouth.      . Cholecalciferol (VITAMIN D3) 2000 UNITS TABS Take 1 tablet by mouth daily.      . Choline Fenofibrate (TRILIPIX) 135 MG capsule Take 135 mg by mouth daily.      . ciprofloxacin (CIPRO) 500 MG tablet Take 500 mg by mouth 2 (two) times daily.      Marland Kitchen ezetimibe (ZETIA) 10 MG tablet  Take 10 mg by mouth daily.      . fluticasone (FLONASE) 50 MCG/ACT nasal spray Place 1 spray into the nose daily.      . folic acid (FOLVITE) 400 MCG tablet Take 400 mcg by mouth daily.      Marland Kitchen ibuprofen (ADVIL,MOTRIN) 600 MG tablet Take 600 mg by mouth every 6 (six) hours as needed. For pain      . insulin regular (NOVOLIN R,HUMULIN R) 100 units/mL injection Inject 5-16 Units into the skin 2 (two) times daily before a meal. Takes 16 units in the morning and 5 units at night      . magnesium oxide (MAG-OX) 400 MG tablet Take 400 mg by mouth daily.      . meloxicam (MOBIC) 15 MG tablet Take 15 mg by mouth daily.      March Rummage (EYE ALLERGY RELIEF) 0.027-0.315 % SOLN Apply 1 drop to eye 2 (two) times daily.      . Nebivolol HCl (BYSTOLIC) 20 MG TABS Take 1 tablet by mouth daily.      Marland Kitchen olmesartan-hydrochlorothiazide (BENICAR HCT) 40-12.5 MG per tablet Take 1 tablet by mouth daily.      . Omega-3 Fatty Acids (FISH OIL) 1000 MG CAPS Take 1 capsule by mouth 2 (two) times daily.      . ondansetron (ZOFRAN) 8 MG tablet Take by mouth every 6 (six) hours as needed. For nausea      . oxymetazoline (NASAL DECONGESTANT SPRAY) 0.05 % nasal spray Place 2 sprays into the nose as needed. For nasal congestion      . rosuvastatin (CRESTOR) 20 MG tablet Take 20 mg by mouth daily.      . vitamin C (ASCORBIC ACID) 500 MG tablet Take 500 mg by mouth daily.      . vitamin E 400 UNIT capsule Take 400 Units by mouth daily.      Marland Kitchen zinc gluconate 50 MG tablet Take 50 mg by mouth daily.       Assessment: Complicated pyelonephritis with possible sepsis.  SrCr decreased to 1.26 today.  Will adjust antibiotics based on improved renal function  Goal of Therapy:  Vancomycin trough level 15-20 mcg/ml  Plan:  Change Imipenem to 500mg  q6h  Cont Vancomycin 1500 mg q24h   Talbert Cage Poteet 03/21/2012,1:03 PM

## 2012-03-21 NOTE — Progress Notes (Signed)
Patient ID: Kristine Gray, female   DOB: 06/22/1951, 61 y.o.   MRN: [id number]  Reviewed CT images. Decreased air in collecting system and around kidney. No fluid collections. No new recs.

## 2012-03-21 NOTE — Progress Notes (Signed)
[facility] PROGRESS NOTE  Kristine Gray ZOX:[id number] DOB: 01/02/1951 DOA: 03/18/2012 PCP: Aura Dials, MD Endocrinologist - Dr.South.   Assessment/Plan: 1. Sepsis: Resolved. Secondary to Escherichia coli secondary to emphysematous pyonephrosis. 2. Pyonephrosis/nephrolithiasis: Narrow antibiotics to Levaquin. Status post percutaneous nephrostomy tube. Outpatient followup with nephrology for stone removal. 3. Acute renal failure: Secondary to obstruction and infection. Resolved. 4. Diabetes mellitus type 2, uncontrolled by hemoglobin A1c 8.3: Stable as an inpatient. Continue sliding scale insulin, Lantus.  Code Status: Full code Family Communication:  Disposition Plan: Home 6/28.  Brendia Sacks, MD  [facility] Pager [phone]. If 8PM-8AM, please contact night-coverage at [email or web address], password [facility] 03/21/2012, 1:40 PM  LOS: 3 days   Brief narrative: 61 year old woman present with left flank pain. CT demonstrated UPJ stone with emphysematous hydronephrosis.  Patient seen by urology who recommended percutaneous nephrostomy.  Consultants:  Urology  Procedures:   03/19/2012 Percutaneous nephrostomy  03/21/2012 PICC line  Antibiotics:  Levaquin 6/27?  Vancomycin/imipenem 6/25? 6/27  HPI/Subjective: Complains of indigestion but otherwise doing well.  Objective: Filed Vitals:   03/20/12 2050 03/21/12 0507 03/21/12 0925 03/21/12 1338  BP: 149/64 138/61 152/60 152/64  Pulse: 66 70 72 66  Temp: 99.7 F (37.6 C) 98.8 F (37.1 C) 98.1 F (36.7 C) 98.7 F (37.1 C)  TempSrc: Oral Oral Oral Oral  Resp: 18 18 17 18   Height:      Weight: 114.8 kg (253 lb 1.4 oz)     SpO2: 98% 96% 99% 96%    Intake/Output Summary (Last 24 hours) at 03/21/12 1340 Last data filed at 03/21/12 1339  Gross per 24 hour  Intake 4512.5 ml  Output   2135 ml  Net 2377.5 ml    Exam:   General:  This appears calm and comfortable.  Cardiovascular: Regular  rate and rhythm. No murmur, rub, gallop.  Respiratory: Clear to auscultation bilaterally. No wheezes, rales, rhonchi. Normal respiratory effort.  Data Reviewed: Basic Metabolic Panel:  Lab 03/21/12 8295 03/20/12 0416 03/19/12 0307 03/18/12 2038  NA 139 136 134* 132*  K 3.9 4.1 5.0 4.6  CL 100 98 88* 88*  CO2 24 25 18* 16*  GLUCOSE 188* 257* 291* 249*  BUN 30* 46* 52* 45*  CREATININE 1.26* 1.70* 2.14* 1.99*  CALCIUM 9.1 8.6 10.1 10.1  MG -- -- -- --  PHOS -- -- -- --   Liver Function Tests:  Lab 03/19/12 0307  AST 23  ALT 28  ALKPHOS 78  BILITOT 0.3  PROT 7.7  ALBUMIN 2.7*   CBC:  Lab 03/21/12 0625 03/20/12 0416 03/19/12 0307 03/18/12 2038  WBC 13.1* 11.9* 17.9* 16.7*  NEUTROABS -- -- -- 14.4*  HGB 11.1* 10.7* 12.7 12.4  HCT 32.5* 32.5* 37.8 37.5  MCV 87.8 87.1 88.3 88.2  PLT 275 237 239 228   CBG:  Lab 03/21/12 1140 03/21/12 0754 03/20/12 2055 03/20/12 1824 03/20/12 1637  GLUCAP 217* 176* 199* 252* 294*    Recent Results (from the past 240 hour(s))  URINE CULTURE     Status: Normal (Preliminary result)   Collection Time   03/18/12  8:39 PM      Component Value Range Status Comment   Specimen Description URINE, CLEAN CATCH   Final    Special Requests NONE   Final    Culture  Setup Time [id number]   Final    Colony Count >=100,000 COLONIES/ML   Final    Culture ESCHERICHIA COLI   Final    Report Status  PENDING   Incomplete   CULTURE, BLOOD (ROUTINE X 2)     Status: Normal (Preliminary result)   Collection Time   03/18/12 11:51 PM      Component Value Range Status Comment   Specimen Description BLOOD LEFT HAND   Final    Special Requests BOTTLES DRAWN AEROBIC ONLY 1CC   Final    Culture  Setup Time [id number]   Final    Culture     Final    Value:        BLOOD CULTURE RECEIVED NO GROWTH TO DATE CULTURE WILL BE HELD FOR 5 DAYS BEFORE ISSUING A FINAL NEGATIVE REPORT   Report Status PENDING   Incomplete   CULTURE, BLOOD (ROUTINE X 2)     Status: Normal  (Preliminary result)   Collection Time   03/18/12 11:55 PM      Component Value Range Status Comment   Specimen Description BLOOD LEFT ARM   Final    Special Requests BOTTLES DRAWN AEROBIC ONLY 1CC   Final    Culture  Setup Time [id number]   Final    Culture     Final    Value:        BLOOD CULTURE RECEIVED NO GROWTH TO DATE CULTURE WILL BE HELD FOR 5 DAYS BEFORE ISSUING A FINAL NEGATIVE REPORT   Report Status PENDING   Incomplete   MRSA PCR SCREENING     Status: Normal   Collection Time   03/19/12  2:59 AM      Component Value Range Status Comment   MRSA by PCR NEGATIVE  NEGATIVE Final     Scheduled Meds:   . Canagliflozin  100 mg Oral QAC breakfast  . fluticasone  1 spray Each Nare Daily  . imipenem-cilastatin  500 mg Intravenous Q6H  . insulin aspart  0-20 Units Subcutaneous TID WC  . insulin aspart  0-5 Units Subcutaneous QHS  . insulin aspart  3 Units Subcutaneous TID WC  . insulin glargine  10 Units Subcutaneous QHS  . nebivolol  20 mg Oral Daily  . sodium chloride  10-40 mL Intracatheter Q12H  . vancomycin  1,500 mg Intravenous Q24H  . DISCONTD: Canagliflozin  100 mg Oral Daily  . DISCONTD: imipenem-cilastatin  250 mg Intravenous Q6H  . DISCONTD: insulin aspart  0-5 Units Subcutaneous QHS  . DISCONTD: insulin aspart  0-9 Units Subcutaneous TID WC  . DISCONTD: sodium chloride  3 mL Intravenous Q12H   Continuous Infusions:   . sodium chloride 75 mL/hr at 03/21/12 2130    Principal Problem:  *Sepsis Active Problems:  Pyelonephritis  DM2 (diabetes mellitus, type 2)  HTN (hypertension)  Hyperlipidemia  Renal failure

## 2012-03-22 DIAGNOSIS — A413 Sepsis due to Hemophilus influenzae: Secondary | ICD-10-CM

## 2012-03-22 DIAGNOSIS — R5381 Other malaise: Secondary | ICD-10-CM

## 2012-03-22 DIAGNOSIS — E1165 Type 2 diabetes mellitus with hyperglycemia: Secondary | ICD-10-CM

## 2012-03-22 DIAGNOSIS — N179 Acute kidney failure, unspecified: Secondary | ICD-10-CM

## 2012-03-22 LAB — GLUCOSE, CAPILLARY: Glucose-Capillary: 201 mg/dL — ABNORMAL HIGH (ref 70–99)

## 2012-03-22 MED ORDER — CIPROFLOXACIN HCL 500 MG PO TABS: 500.0000 mg | ORAL_TABLET | Freq: Two times a day (BID) | ORAL | Status: DC

## 2012-03-22 NOTE — Progress Notes (Signed)
[facility] PROGRESS NOTE  Kristine Gray YNW:[id number] DOB: 24-Feb-1951 DOA: 03/18/2012 PCP: Aura Dials, MD Urologist: Jerilee Field, M.D. Endocrinologist - Dr.South.  Assessment/Plan: 1. Sepsis: Resolved. Secondary to Escherichia coli secondary to emphysematous pyonephrosis. 2. Pyonephrosis/nephrolithiasis: Change to Cipro. Discussed with Dr. Roderic Ovens to change to oral antibiotics. Status post percutaneous nephrostomy tube. Outpatient followup with nephrology for stone removal. Husband is an ICU nurse and feels quite comfortable taking care of nephrostomy. They do not feel home health needed. 3. Acute renal failure: Secondary to obstruction and infection. Resolved. 4. Diabetes mellitus type 2, uncontrolled by hemoglobin A1c 8.3: Stable as an inpatient. Continue sliding scale insulin, Lantus.  Code Status: Full code Family Communication:  Disposition Plan: Home 6/28.  Brendia Sacks, MD  [facility] Pager [phone]. If 8PM-8AM, please contact night-coverage at [email or web address], password [facility] 03/22/2012, 1:53 PM  LOS: 4 days   Brief narrative: 61 year old woman present with left flank pain. CT demonstrated UPJ stone with emphysematous hydronephrosis.  Patient seen by urology who recommended percutaneous nephrostomy.  Consultants:  Urology  Procedures:  03/19/2012 Percutaneous nephrostomy  03/21/2012 PICC line  Antibiotics:  Levaquin 6/27?  Vancomycin/imipenem 6/25? 6/27  HPI/Subjective: Indigestion. No complaints.  Objective: Filed Vitals:   03/21/12 1708 03/21/12 2004 03/22/12 0507 03/22/12 1000  BP: 150/61 159/77 165/73 137/63  Pulse: 67 81 73 84  Temp: 98.7 F (37.1 C) 97.9 F (36.6 C) 98.7 F (37.1 C) 97.4 F (36.3 C)  TempSrc: Oral Oral Oral Oral  Resp: 18 18 18 18   Height:      Weight:  114.4 kg (252 lb 3.3 oz)    SpO2: 100% 99% 99% 96%    Intake/Output Summary (Last 24 hours) at 03/22/12 1353 Last data filed at 03/22/12  0900  Gross per 24 hour  Intake    480 ml  Output   2350 ml  Net  -1870 ml    Exam:   General: Appears calm and comfortable.  Cardiovascular: Regular rate and rhythm. No murmur, rub, gallop. No significant lower extremity edema.  Respiratory: Clear to auscultation bilaterally. No wheezes, rales, rhonchi. Normal respiratory effort.  Data Reviewed: Basic Metabolic Panel:  Lab 03/21/12 6295 03/20/12 0416 03/19/12 0307 03/18/12 2038  NA 139 136 134* 132*  K 3.9 4.1 5.0 4.6  CL 100 98 88* 88*  CO2 24 25 18* 16*  GLUCOSE 188* 257* 291* 249*  BUN 30* 46* 52* 45*  CREATININE 1.26* 1.70* 2.14* 1.99*  CALCIUM 9.1 8.6 10.1 10.1  MG -- -- -- --  PHOS -- -- -- --   Liver Function Tests:  Lab 03/19/12 0307  AST 23  ALT 28  ALKPHOS 78  BILITOT 0.3  PROT 7.7  ALBUMIN 2.7*   CBC:  Lab 03/21/12 0625 03/20/12 0416 03/19/12 0307 03/18/12 2038  WBC 13.1* 11.9* 17.9* 16.7*  NEUTROABS -- -- -- 14.4*  HGB 11.1* 10.7* 12.7 12.4  HCT 32.5* 32.5* 37.8 37.5  MCV 87.8 87.1 88.3 88.2  PLT 275 237 239 228   CBG:  Lab 03/22/12 1209 03/22/12 0811 03/21/12 2129 03/21/12 1710 03/21/12 1140  GLUCAP 201* 212* 298* 202* 217*    Recent Results (from the past 240 hour(s))  URINE CULTURE     Status: Normal   Collection Time   03/18/12  8:39 PM      Component Value Range Status Comment   Specimen Description URINE, CLEAN CATCH   Final    Special Requests NONE   Final    Culture  Setup Time [id number]   Final    Colony Count >=100,000 COLONIES/ML   Final    Culture     Final    Value: ESCHERICHIA COLI     Note: Two isolates with different morphologies were identified as the same organism.The most resistant organism was reported.   Report Status 03/21/2012 FINAL   Final    Organism ID, Bacteria ESCHERICHIA COLI   Final   CULTURE, BLOOD (ROUTINE X 2)     Status: Normal (Preliminary result)   Collection Time   03/18/12 11:51 PM      Component Value Range Status Comment   Specimen  Description BLOOD LEFT HAND   Final    Special Requests BOTTLES DRAWN AEROBIC ONLY 1CC   Final    Culture  Setup Time [id number]   Final    Culture     Final    Value:        BLOOD CULTURE RECEIVED NO GROWTH TO DATE CULTURE WILL BE HELD FOR 5 DAYS BEFORE ISSUING A FINAL NEGATIVE REPORT   Report Status PENDING   Incomplete   CULTURE, BLOOD (ROUTINE X 2)     Status: Normal (Preliminary result)   Collection Time   03/18/12 11:55 PM      Component Value Range Status Comment   Specimen Description BLOOD LEFT ARM   Final    Special Requests BOTTLES DRAWN AEROBIC ONLY 1CC   Final    Culture  Setup Time [id number]   Final    Culture     Final    Value:        BLOOD CULTURE RECEIVED NO GROWTH TO DATE CULTURE WILL BE HELD FOR 5 DAYS BEFORE ISSUING A FINAL NEGATIVE REPORT   Report Status PENDING   Incomplete   MRSA PCR SCREENING     Status: Normal   Collection Time   03/19/12  2:59 AM      Component Value Range Status Comment   MRSA by PCR NEGATIVE  NEGATIVE Final     Scheduled Meds:    . Canagliflozin  100 mg Oral QAC breakfast  . fluticasone  1 spray Each Nare Daily  . insulin aspart  0-20 Units Subcutaneous TID WC  . insulin aspart  0-5 Units Subcutaneous QHS  . insulin aspart  3 Units Subcutaneous TID WC  . insulin glargine  10 Units Subcutaneous QHS  . levofloxacin (LEVAQUIN) IV  750 mg Intravenous Q24H  . nebivolol  20 mg Oral Daily  . sodium chloride  10-40 mL Intracatheter Q12H  . DISCONTD: imipenem-cilastatin  500 mg Intravenous Q6H  . DISCONTD: vancomycin  1,500 mg Intravenous Q24H   Continuous Infusions:    . DISCONTD: sodium chloride 75 mL/hr at 03/21/12 2536    Principal Problem:  *Sepsis Active Problems:  Pyelonephritis  DM2 (diabetes mellitus, type 2)  HTN (hypertension)  Hyperlipidemia  Renal failure

## 2012-03-22 NOTE — Discharge Summary (Signed)
Physician Discharge Summary  ALMAROSA BOHAC ZOX:[id number] DOB: 01-Jan-1951 DOA: 03/18/2012  PCP: Aura Dials, MD Urologist: Jerilee Field, M.D. Endocrinologist - Dr.South.  Admit date: 03/18/2012 Discharge date: 03/22/2012  Recommendations for Outpatient Follow-up:  1. Followup hydronephrosis and percutaneous nephrostomy tube. 2. Followup nephrolithiasis.  3. Consider repeat basic metabolic panel in one week to followup acute renal failure.   Follow-up Information    Follow up with BOUSKA,DAVID E, MD in 1 week.   Contact information:   [address] [place] [address] [phone]       Follow up with Antony Haste, MD. Schedule an appointment as soon as possible for a visit in 2 weeks.   Contact information:   [address] [facility] [place] [address] [phone]         Discharge Diagnoses:  1. Sepsis 2. Pyonephrosis/nephrolithiasis 3. Acute renal failure 4. Diabetes mellitus type 2, uncontrolled by hemoglobin A1c  Discharge Condition: Improved Disposition: Home  Diet recommendation: Diabetic  History of present illness:  61 year old woman presented with left flank pain. CT demonstrated UPJ stone with emphysematous hydronephrosis.  Hospital Course:  Ms. Silberstein was seen by urology and urgent percutaneous nephrostomy was recommended. This was accomplished by interventional radiology the patient was placed on broad-spectrum antibiotics. Sepsis gradually resolve with these treatments. Acute renal failure resolving nicely with IV fluids and resolution of acute infection. Patient cleared for discharge by urology. She will followup with urology in approximately 2-3 weeks for stone extraction. Nephrology will followup and coordinate nephrostomy removal. Her husband and experienced ICU nurse and will be caring for her drain. Diabetes stable as an inpatient.  Benicar, hydrochlorothiazide, and says discontinued in the context of acute renal failure. Can likely be restarted as indicated and followup.  1. Sepsis: Resolved. Secondary to Escherichia coli secondary to emphysematous pyonephrosis.  2. Pyonephrosis/nephrolithiasis: Change to Cipro. Discussed with Dr. Roderic Ovens to change to oral antibiotics. Status post percutaneous nephrostomy tube. Outpatient followup with nephrology for stone removal. Husband is an ICU nurse and feels quite comfortable taking care of nephrostomy. They do not feel home health needed.  3. Acute renal failure: Secondary to obstruction and infection. Resolved.  4. Diabetes mellitus type 2, uncontrolled by hemoglobin A1c 8.3: Stable as an inpatient. Continue sliding scale insulin, Lantus.  Consultants:  Urology  Procedures:  03/19/2012 Percutaneous nephrostomy   03/21/2012 PICC line  Discharge Instructions  Discharge Orders    Future Orders Please Complete By Expires   Diet - low sodium heart healthy      Increase activity slowly      Discharge instructions      Comments:   Call physician for increasing pain, vomiting, fever.     Medication List  As of 03/22/2012  2:52 PM   STOP taking these medications         ibuprofen 600 MG tablet      meloxicam 15 MG tablet      olmesartan-hydrochlorothiazide 40-12.5 MG per tablet         TAKE these medications         acetaminophen 500 MG tablet   Commonly known as: TYLENOL   Take 500 mg by mouth every 6 (six) hours as needed. For pain      acetaminophen-codeine 300-30 MG per tablet   Commonly known as: TYLENOL #3   Take 1 tablet by mouth every 4 (four) hours as needed. For pain  aspirin EC 81 MG tablet   Take 162 mg by mouth daily.      azithromycin 1 % ophthalmic solution   Commonly known as: AZASITE   Place 1 drop into both eyes as needed.      BLACK COHOSH PO   Take 1 tablet by mouth 2 (two) times daily.      BYSTOLIC 20 MG Tabs   Generic drug:  Nebivolol HCl   Take 1 tablet by mouth daily.      calcium carbonate 600 MG Tabs   Commonly known as: OS-CAL   Take 1,500 mg by mouth daily.      ciprofloxacin 500 MG tablet   Commonly known as: CIPRO   Take 1 tablet (500 mg total) by mouth 2 (two) times daily.      EYE ALLERGY RELIEF 0.027-0.315 % Soln   Generic drug: Naphazoline-Pheniramine   Apply 1 drop to eye 2 (two) times daily.      ezetimibe 10 MG tablet   Commonly known as: ZETIA   Take 10 mg by mouth daily.      Fish Oil 1000 MG Caps   Take 1 capsule by mouth 2 (two) times daily.      fluticasone 50 MCG/ACT nasal spray   Commonly known as: FLONASE   Place 1 spray into the nose daily.      folic acid 400 MCG tablet   Commonly known as: FOLVITE   Take 400 mcg by mouth daily.      insulin regular 100 units/mL injection   Commonly known as: NOVOLIN R,HUMULIN R   Inject 5-16 Units into the skin 2 (two) times daily before a meal. Takes 16 units in the morning and 5 units at night      INVOKANA 100 MG Tabs   Generic drug: Canagliflozin   Take by mouth.      magnesium oxide 400 MG tablet   Commonly known as: MAG-OX   Take 400 mg by mouth daily.      NASAL DECONGESTANT SPRAY 0.05 % nasal spray   Generic drug: oxymetazoline   Place 2 sprays into the nose as needed. For nasal congestion      ondansetron 8 MG tablet   Commonly known as: ZOFRAN   Take by mouth every 6 (six) hours as needed. For nausea      rosuvastatin 20 MG tablet   Commonly known as: CRESTOR   Take 20 mg by mouth daily.      TRILIPIX 135 MG capsule   Generic drug: Choline Fenofibrate   Take 135 mg by mouth daily.      vitamin C 500 MG tablet   Commonly known as: ASCORBIC ACID   Take 500 mg by mouth daily.      Vitamin D3 2000 UNITS Tabs   Take 1 tablet by mouth daily.      vitamin E 400 UNIT capsule   Take 400 Units by mouth daily.      zinc gluconate 50 MG tablet   Take 50 mg by mouth daily.           Follow-up Information      Follow up with BOUSKA,DAVID E, MD in 1 week.   Contact information:   [address] [place] [address] [phone]       Follow up with Antony Haste, MD. Schedule an appointment as soon as possible for a visit in 2 weeks.   Contact information:   [address]  [address] [facility] [place] [address] [phone]         The results of significant diagnostics from this hospitalization (including imaging, microbiology, ancillary and laboratory) are listed below for reference.    Significant Diagnostic Studies: Ct Abdomen Pelvis Wo Contrast  03/18/2012  *RADIOLOGY REPORT*  Clinical Data: Bilateral flank pain.  Nausea.  CT ABDOMEN AND PELVIS WITHOUT CONTRAST  Technique:  Multidetector CT imaging of the abdomen and pelvis was performed following the standard protocol without intravenous contrast.  Comparison: None.  Findings: The liver and spleen have normal uninfused features.  The stomach, pancreas, and adrenal glands are unremarkable.  Large duodenal diverticulum noted.  Gallbladder is surgically absent.  The right kidney is atrophic but otherwise unremarkable.  Multiple stones are seen in the left kidney.  The largest is in the renal pelvis and measures 11 x 12 x 7 mm.  A second stone in the interpolar region measures 8 x 12 mm.  A third 5 mm stone is seen towards the lower pole.  Multiple areas of gas are seen with in the collecting system of the left kidney, adjacent to and abutting the larger stones.  There is also some free gas in the perirenal fat around the left kidney.  No abdominal aortic aneurysm.  No free fluid or lymphadenopathy in the abdomen.  Imaging through the pelvis shows no free intraperitoneal fluid.  No pelvic sidewall lymphadenopathy.  Uterus is unremarkable.  There is no adnexal mass.  Tiny amount of gas is seen within the urinary bladder.  Scattered diverticular changes  are seen in the left colon without diverticulitis.  Terminal ileum and appendix are normal.  Bone windows reveal no worrisome lytic or sclerotic osseous lesions.  IMPRESSION: Several large stones in the left intrarenal collecting system with gas seen in the collecting system around the stones and also in the retroperitoneal fat anterior to the kidney.  These changes are associated with a small amount of gas in the urinary bladder. 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  I cannot identify no definite gas within the renal parenchyma to suggest emphysematous pyelonephritis.  There is not a substantial amount of edema or fluid around the left kidney or left collecting system.  I personally discussed these results by telephone with Dr. Everlene Other at 1850 hours on 03/18/2012.  Original Report Authenticated By: ERIC A. MANSELL, M.D.   Ct Abdomen Wo Contrast  03/21/2012  *RADIOLOGY REPORT*  Clinical Data:  Left pyonephrosis, post nephrostomy catheter placement.  CT ABDOMEN WITHOUT CONTRAST  Technique:  Multidetector CT imaging of the abdomen was performed following the standard protocol without IV contrast.  Comparison:  03/18/2012  Findings:  Interval placement of a left percutaneous nephrostomy catheter which is well positioned.  Persistent left nephrolithiasis with the largest 12 mm stone near the UPJ.  There is a persistent 2.8 cm gas collection at the   anteromedial margin of the kidney, with some adjacent smaller gas pockets, decreased in overall size since previous exam, all contained within Gerota's fascia.  There is no significant fluid component. There are mild regional inflammatory/edematous changes in the retroperitoneal fat as before.  This is not contiguous with bowel.  No hydronephrosis.  Visualized lung bases clear.  Coronary calcifications noted. Vascular clips in the gallbladder fossa.   Unremarkable uninfused evaluation of liver, spleen, adrenal glands, right kidney, pancreas,  stomach, and visualized portions of small bowel and appendix and colon. No free air.  No ascites.  Patchy aortic calcified plaque without aneurysm. Spondylitic changes in the lower lumbar spine.  IMPRESSION: 1.  Stable position of left nephrostomy catheter; no hydronephrosis. 2.  Interval decrease in the amount of gas in and around the left renal collecting system. 3.  Left nephrolithiasis including a 12 mm UPJ calculus as before. 4. Atherosclerosis, including . coronary artery disease. Please note that although the presence of coronary artery calcium documents the presence of coronary artery disease, the severity of this disease and any potential stenosis cannot be assessed on this non-gated CT examination.  Assessment for potential risk factor modification, dietary therapy or pharmacologic therapy may be warranted, if clinically indicated.  Original Report Authenticated By: Osa Craver, M.D.   Microbiology: Recent Results (from the past 240 hour(s))  URINE CULTURE     Status: Normal   Collection Time   03/18/12  8:39 PM      Component Value Range Status Comment   Specimen Description URINE, CLEAN CATCH   Final    Special Requests NONE   Final    Culture  Setup Time [id number]   Final    Colony Count >=100,000 COLONIES/ML   Final    Culture     Final    Value: ESCHERICHIA COLI     Note: Two isolates with different morphologies were identified as the same organism.The most resistant organism was reported.   Report Status 03/21/2012 FINAL   Final    Organism ID, Bacteria ESCHERICHIA COLI   Final   CULTURE, BLOOD (ROUTINE X 2)     Status: Normal (Preliminary result)   Collection Time   03/18/12 11:51 PM      Component Value Range Status Comment   Specimen Description BLOOD LEFT HAND   Final    Special Requests BOTTLES DRAWN AEROBIC ONLY 1CC   Final    Culture  Setup Time [id number]   Final     Culture     Final    Value:        BLOOD CULTURE RECEIVED NO GROWTH TO DATE CULTURE WILL BE HELD FOR 5 DAYS BEFORE ISSUING A FINAL NEGATIVE REPORT   Report Status PENDING   Incomplete   CULTURE, BLOOD (ROUTINE X 2)     Status: Normal (Preliminary result)   Collection Time   03/18/12 11:55 PM      Component Value Range Status Comment   Specimen Description BLOOD LEFT ARM   Final    Special Requests BOTTLES DRAWN AEROBIC ONLY 1CC   Final    Culture  Setup Time [id number]   Final    Culture     Final    Value:        BLOOD CULTURE RECEIVED NO GROWTH TO DATE CULTURE WILL BE HELD FOR 5 DAYS BEFORE ISSUING A FINAL NEGATIVE REPORT   Report Status PENDING   Incomplete   MRSA PCR SCREENING     Status: Normal   Collection Time   03/19/12  2:59 AM      Component Value Range Status Comment   MRSA by PCR NEGATIVE  NEGATIVE Final     Labs: Basic Metabolic Panel:  Lab 03/21/12 6295 03/20/12 0416 03/19/12 0307 03/18/12 2038  NA 139 136 134* 132*  K 3.9 4.1 5.0 4.6  CL 100 98 88* 88*  CO2 24 25 18* 16*  GLUCOSE 188* 257* 291* 249*  BUN 30*  46* 52* 45*  CREATININE 1.26* 1.70* 2.14* 1.99*  CALCIUM 9.1 8.6 10.1 10.1  MG -- -- -- --  PHOS -- -- -- --   Liver Function Tests:  Lab 03/19/12 0307  AST 23  ALT 28  ALKPHOS 78  BILITOT 0.3  PROT 7.7  ALBUMIN 2.7*   CBC:  Lab 03/21/12 0625 03/20/12 0416 03/19/12 0307 03/18/12 2038  WBC 13.1* 11.9* 17.9* 16.7*  NEUTROABS -- -- -- 14.4*  HGB 11.1* 10.7* 12.7 12.4  HCT 32.5* 32.5* 37.8 37.5  MCV 87.8 87.1 88.3 88.2  PLT 275 237 239 228   CBG:  Lab 03/22/12 1209 03/22/12 0811 03/21/12 2129 03/21/12 1710 03/21/12 1140  GLUCAP 201* 212* 298* 202* 217*    Principal Problem:  *Sepsis Active Problems:  Pyelonephritis  DM2 (diabetes mellitus, type 2)  HTN (hypertension)  Hyperlipidemia  Renal failure   Time coordinating discharge: 20 minutes  Signed:  Brendia Sacks, MD [facility] 03/22/2012, 2:52 PM

## 2012-03-22 NOTE — Progress Notes (Signed)
Pt. Got d/c instructions.medication was pick up from main pharmacy and give it to the pt.pt ready to go home.

## 2012-03-25 LAB — CULTURE, BLOOD (ROUTINE X 2)
Culture: NO GROWTH
Culture: NO GROWTH

## 2012-03-27 ENCOUNTER — Other Ambulatory Visit: Payer: Self-pay | Admitting: Urology

## 2012-03-27 DIAGNOSIS — N200 Calculus of kidney: Secondary | ICD-10-CM

## 2012-04-01 ENCOUNTER — Encounter (HOSPITAL_COMMUNITY): Payer: Self-pay | Admitting: Pharmacy Technician

## 2012-04-01 ENCOUNTER — Other Ambulatory Visit: Payer: Self-pay | Admitting: Radiology

## 2012-04-02 ENCOUNTER — Encounter (HOSPITAL_COMMUNITY)
Admission: RE | Admit: 2012-04-02 | Discharge: 2012-04-02 | Disposition: A | Discharge status: Home / Self Care | Payer: 59 | Source: Ambulatory Visit | Attending: Urology | Admitting: Urology

## 2012-04-02 ENCOUNTER — Encounter (HOSPITAL_COMMUNITY): Payer: Self-pay

## 2012-04-02 DIAGNOSIS — M1990 Unspecified osteoarthritis, unspecified site: Secondary | ICD-10-CM

## 2012-04-02 DIAGNOSIS — IMO0002 Reserved for concepts with insufficient information to code with codable children: Secondary | ICD-10-CM

## 2012-04-02 DIAGNOSIS — N200 Calculus of kidney: Secondary | ICD-10-CM

## 2012-04-02 DIAGNOSIS — K5730 Diverticulosis of large intestine without perforation or abscess without bleeding: Secondary | ICD-10-CM

## 2012-04-02 HISTORY — PX: DILATION AND CURETTAGE OF UTERUS: SHX78

## 2012-04-02 HISTORY — DX: Calculus of kidney: N20.0

## 2012-04-02 HISTORY — PX: TUBAL LIGATION: SHX77

## 2012-04-02 HISTORY — DX: Reserved for concepts with insufficient information to code with codable children: IMO0002

## 2012-04-02 HISTORY — DX: Diverticulosis of large intestine without perforation or abscess without bleeding: K57.30

## 2012-04-02 HISTORY — DX: Unspecified osteoarthritis, unspecified site: M19.90

## 2012-04-02 HISTORY — DX: Anemia, unspecified: D64.9

## 2012-04-02 HISTORY — PX: OTHER SURGICAL HISTORY: SHX169

## 2012-04-02 LAB — CBC WITH DIFFERENTIAL/PLATELET
Eosinophils Absolute: 0.1 10*3/uL (ref 0.0–0.7)
Eosinophils Relative: 1 % (ref 0–5)
Hemoglobin: 11.4 g/dL — ABNORMAL LOW (ref 12.0–15.0)
Lymphocytes Relative: 27 % (ref 12–46)
Lymphs Abs: 2.6 10*3/uL (ref 0.7–4.0)
MCH: 28.9 pg (ref 26.0–34.0)
MCV: 89.9 fL (ref 78.0–100.0)
Monocytes Relative: 8 % (ref 3–12)
Platelets: 373 10*3/uL (ref 150–400)
RBC: 3.95 MIL/uL (ref 3.87–5.11)
WBC: 9.8 10*3/uL (ref 4.0–10.5)

## 2012-04-02 LAB — ABO/RH: ABO/RH(D): AB POS

## 2012-04-02 LAB — BASIC METABOLIC PANEL
CO2: 27 mEq/L (ref 19–32)
Chloride: 101 mEq/L (ref 96–112)
Glucose, Bld: 155 mg/dL — ABNORMAL HIGH (ref 70–99)
Potassium: 4.3 mEq/L (ref 3.5–5.1)
Sodium: 139 mEq/L (ref 135–145)

## 2012-04-02 LAB — APTT: aPTT: 29 seconds (ref 24–37)

## 2012-04-02 LAB — PROTIME-INR
INR: 0.95 (ref 0.00–1.49)
Prothrombin Time: 12.9 seconds (ref 11.6–15.2)

## 2012-04-02 NOTE — Pre-Procedure Instructions (Signed)
04-02-12 EKG done today. CXR 1 view 03-20-12 in Epic.W. Kennon Portela

## 2012-04-02 NOTE — Patient Instructions (Addendum)
20 Kristine Gray  04/02/2012   Your procedure is scheduled on: 7-16  -2013  Report to [facility] Interventional Radiology at   [facility]     AM .  Call this number if you have problems the morning of surgery: [phone]   Remember:   Do not eat food:After Midnight.    Take these medicines the morning of surgery with A SIP OF WATER: Bystolic, Tylenol #3. Bring Fluticasone nasal spray. Bring [place] to hospital(do not take).   Do not wear jewelry, make-up or nail polish.  Do not wear lotions, powders, or perfumes. You may wear deodorant.  Do not shave 48 hours prior to surgery.(face and neck okay, no shaving of legs)  Do not bring valuables to the hospital.  Contacts, dentures or bridgework may not be worn into surgery.  Leave suitcase in the car. After surgery it may be brought to your room.  For patients admitted to the hospital, checkout time is 11:00 AM the day of discharge.   Patients discharged the day of surgery will not be allowed to drive home.  Name and phone number of your driver: spouse  Special Instructions: CHG Shower Use Special Wash: 1/2 bottle night before surgery and 1/2 bottle morning of surgery.(avoid face and genitals)   Please read over the following fact sheets that you were given: MRSA Information.

## 2012-04-02 NOTE — Pre-Procedure Instructions (Signed)
04-02-12 EKG done today

## 2012-04-05 ENCOUNTER — Other Ambulatory Visit: Payer: Self-pay | Admitting: Radiology

## 2012-04-08 NOTE — H&P (Signed)
History of Present Illness        This is a new patient referred by Dr. Everlene Other for left nephrolithiasis and pyonephrosis. Patient's a 61 year old female she was seen in the hospital last week. She developed the acute onset of left flank pain fevers chills nausea and vomiting. CT scan revealed a left 12 mm UPJ stone, a 12 mm LMP stone and a 6 mm LLP stone. She had pyonephrosis with air in the collecting system and anterior to the kidney. She underwent left nephrostomy tube and recovered quite well from her sepsis. The left PCnx went through a middle pole access. Urine culture grew E. coli sensitive to ampicillin, cefazolin, gentamicin, Levaquin, Cipro, nitrofurantoin and Bactrim.  She was seen by Dr. Etta Grandchild in 2007 for left nephrolithiasis when both 1 cm stones were in the lower pole and since migrated proximally.   Today,  There are no aggravating or alleviating factors. There no associated signs or symptoms.   Past Medical History Problems  1. History of  Diabetes Mellitus 250.00 2. History of  Hyperlipidemia 272.4 3. History of  Hypertension 401.9 4. History of  Renal Failure 586  Surgical History Problems  1. History of  Cesarean Section 2. History of  Cholecystectomy 3. History of  Tonsillectomy 4. History of  Tubal Ligation V25.2  Current Meds 1. Acetaminophen 500 MG Oral Tablet; Therapy: (Recorded:02Jul2013) to 2. Aspirin 81 MG Oral Tablet; Therapy: (Recorded:02Jul2013) to 3. AzaSite 1 % Ophthalmic Solution; Therapy: (Recorded:02Jul2013) to 4. Black Cohosh TABS; Therapy: (Recorded:02Jul2013) to 5. Bystolic 20 MG Oral Tablet; Therapy: (Recorded:02Jul2013) to 6. Calcium Carbonate 600 MG Oral Tablet; Therapy: (Recorded:02Jul2013) to 7. Ciprofloxacin 500 MG TABS; Therapy: (Recorded:02Jul2013) to 8. Crestor 20 MG Oral Tablet; Therapy: (Recorded:02Jul2013) to 9. Eye Allergy Relief 0.027-0.315 % Ophthalmic Solution; Therapy: (Recorded:02Jul2013) to 10. Fish Oil 1000 MG Oral Capsule;  Therapy: (Recorded:02Jul2013) to 11. Fluticasone Propionate 50 MCG/ACT Nasal Suspension; Therapy: (Recorded:02Jul2013) to 12. Folic Acid 400 MCG Oral Tablet; Therapy: (Recorded:02Jul2013) to 13. Insulin; Therapy: (Recorded:02Jul2013) to 14. Invokana 100 MG Oral Tablet; Therapy: (Recorded:02Jul2013) to 15. Magnesium Oxide 400 MG Oral Capsule; Therapy: (Recorded:02Jul2013) to 16. Nasal Decongestant Spray 0.05 % Nasal Solution; Therapy: (Recorded:02Jul2013) to 17. Ondansetron 8 MG Oral Tablet Dispersible; Therapy: (Recorded:02Jul2013) to 18. Trilipix 135 MG Oral Capsule Delayed Release; Therapy: (Recorded:02Jul2013) to 19. Vitamin C TABS; Therapy: (Recorded:02Jul2013) to 20. Vitamin D3 CAPS; Therapy: (Recorded:02Jul2013) to 21. Vitamin E TABS; Therapy: (Recorded:02Jul2013) to 22. Zetia TABS; Therapy: (Recorded:02Jul2013) to 23. Zinc TABS; Therapy: (Recorded:02Jul2013) to  Allergies Medication  1. Penicillins  Family History Problems  1. Maternal history of  Brain Cancer V16.8 2. Family history of  Death In The Family Mother 3. Family history of  Family Health Status - Father's Age age 3 4. Family history of  Family Health Status Number Of Children 1 son 1 daughter  Social History Problems    Marital History - Currently Married Denied    History of  Alcohol Use   History of  Caffeine Use  Review of Systems Genitourinary, constitutional, skin, eye, otolaryngeal, hematologic/lymphatic, cardiovascular, pulmonary, endocrine, musculoskeletal, gastrointestinal, neurological and psychiatric system(s) were reviewed and pertinent findings if present are noted.  Gastrointestinal: nausea, vomiting, heartburn and diarrhea.  Constitutional: fever, night sweats, feeling tired (fatigue) and recent weight loss.  ENT: sinus problems.  Cardiovascular: leg swelling.  Respiratory: cough.  Endocrine: polydipsia.  Musculoskeletal: back pain.    Vitals Vital Signs [Data Includes: Last 1 Day]    02Jul2013 03:26PM  BMI Calculated: 41.23 BSA  Calculated: 2.12 Height: 5 ft 4 in Weight: 241 lb 8.0 oz Blood Pressure: 141 / 84 Temperature: 98.6 F Heart Rate: 83  Physical Exam Constitutional: Well nourished and well developed . No acute distress.  Pulmonary: No respiratory distress and normal respiratory rhythm and effort.  Cardiovascular: Heart rate and rhythm are normal . No peripheral edema.  Abdomen: The abdomen is soft and nontender. No masses are palpated. No CVA tenderness. No hernias are palpable. No hepatosplenomegaly noted. Left Nx with clear urine.  Neuro/Psych:. Mood and affect are appropriate.    Results/Data Urine [Data Includes: Last 1 Day]   02Jul2013  COLOR YELLOW   APPEARANCE CLEAR   SPECIFIC GRAVITY <1.005   pH 6.5   GLUCOSE > 1000 mg/dL  BILIRUBIN NEG   KETONE NEG mg/dL  BLOOD MOD   PROTEIN TRACE mg/dL  UROBILINOGEN 0.2 mg/dL  NITRITE NEG   LEUKOCYTE ESTERASE SMALL   SQUAMOUS EPITHELIAL/HPF FEW   WBC 11-20 WBC/hpf  RBC 11-20 RBC/hpf  BACTERIA FEW   CRYSTALS NONE SEEN   CASTS NONE SEEN    Assessment Assessed  1. Nephrolithiasis 592.0 2. Pyuria 791.9  Plan  Health Maintenance (V70.0)  1. UA With REFLEX  Done: 02Jul2013 03:18PM Nephrolithiasis (592.0)  2. KUB  Done: 02Jul2013 12:00AM Nephrolithiasis (592.0), Pyuria (791.9)  3. Sulfamethoxazole-Trimethoprim 400-80 MG Oral Tablet; TAKE 1 TABLET DAILY; Therapy:  02Jul2013 to (Evaluate:16Aug2013)  Requested for: 02Jul2013; Last Rx:02Jul2013; Edited  URINE CULTURE  Status: In Progress - Specimen/Data Collected  Done: 02Jul2013 Ordered Today; For: Nephrolithiasis (592.0), Pyuria (791.9); Ordered By: Jamie Kato  Due: 04Jul2013 Marked Important; Last Updated By: Larena Sox   Discussion/Summary        Urine sent for Cx as a precaution (NOTE: urine was taken from the patient's Nx bag). Once she completes Cipro, I'm going to put her on a daily Bactrim SS to keep colonization  low.   Using the Understanding Kidney Stones handout, I discussed with the patient the CT and KUB findings (we actually reviewed the images). We discussed the nature, risks and benefits of continued stone passage with medical expulsion therapy, cysto/stent/ureteroscopy/laser lithotripsy, left ESWL,  or left PCNL. All questions answered. Pt elects to proceed with PCNL. We discussed treatment goals may not be reached and/or may require multiple/repeat procedures. We discussed all the stone may not be accessible with the Perc access we have. We also discussed the likelihood of achieving the goals of the procedure and potential problems that might occur during the procedure or recuperation.   cc: Dr. Everlene Other     Signatures Electronically signed by : Jerilee Field, M.D.; Mar 26 2012  4:47PM

## 2012-04-09 ENCOUNTER — Encounter (HOSPITAL_COMMUNITY)
Admission: RE | Disposition: A | Discharge status: Home / Self Care | Payer: Self-pay | Source: Ambulatory Visit | Attending: Urology

## 2012-04-09 ENCOUNTER — Ambulatory Visit (HOSPITAL_COMMUNITY): Payer: 59 | Admitting: Anesthesiology

## 2012-04-09 ENCOUNTER — Encounter (HOSPITAL_COMMUNITY): Payer: Self-pay | Admitting: *Deleted

## 2012-04-09 ENCOUNTER — Inpatient Hospital Stay (HOSPITAL_COMMUNITY)
Admission: RE | Admit: 2012-04-09 | Discharge: 2012-04-10 | DRG: 660 | Disposition: A | Discharge status: Home / Self Care | Payer: 59 | Source: Ambulatory Visit | Attending: Urology | Admitting: Urology

## 2012-04-09 ENCOUNTER — Other Ambulatory Visit: Payer: Self-pay | Admitting: Urology

## 2012-04-09 ENCOUNTER — Encounter (HOSPITAL_COMMUNITY): Payer: Self-pay

## 2012-04-09 ENCOUNTER — Ambulatory Visit (HOSPITAL_COMMUNITY)
Admission: RE | Admit: 2012-04-09 | Discharge: 2012-04-09 | Disposition: A | Discharge status: Home / Self Care | Payer: 59 | Source: Ambulatory Visit | Attending: Urology | Admitting: Urology

## 2012-04-09 ENCOUNTER — Encounter (HOSPITAL_COMMUNITY): Payer: Self-pay | Admitting: Anesthesiology

## 2012-04-09 ENCOUNTER — Ambulatory Visit (HOSPITAL_COMMUNITY): Payer: 59

## 2012-04-09 DIAGNOSIS — N200 Calculus of kidney: Secondary | ICD-10-CM

## 2012-04-09 DIAGNOSIS — E785 Hyperlipidemia, unspecified: Secondary | ICD-10-CM | POA: Diagnosis present

## 2012-04-09 DIAGNOSIS — Z794 Long term (current) use of insulin: Secondary | ICD-10-CM

## 2012-04-09 DIAGNOSIS — E119 Type 2 diabetes mellitus without complications: Secondary | ICD-10-CM | POA: Diagnosis present

## 2012-04-09 DIAGNOSIS — I10 Essential (primary) hypertension: Secondary | ICD-10-CM | POA: Diagnosis present

## 2012-04-09 DIAGNOSIS — Z79899 Other long term (current) drug therapy: Secondary | ICD-10-CM

## 2012-04-09 DIAGNOSIS — Z6841 Body Mass Index (BMI) 40.0 and over, adult: Secondary | ICD-10-CM

## 2012-04-09 HISTORY — PX: NEPHROLITHOTOMY: SHX5134

## 2012-04-09 LAB — GLUCOSE, CAPILLARY
Glucose-Capillary: 249 mg/dL — ABNORMAL HIGH (ref 70–99)
Glucose-Capillary: 267 mg/dL — ABNORMAL HIGH (ref 70–99)
Glucose-Capillary: 185 mg/dL — ABNORMAL HIGH (ref 70–99)

## 2012-04-09 LAB — TYPE AND SCREEN: Antibody Screen: NEGATIVE

## 2012-04-09 SURGERY — NEPHROLITHOTOMY PERCUTANEOUS
Anesthesia: General | Laterality: Left | Wound class: Clean Contaminated

## 2012-04-09 MED ORDER — OXYCODONE-ACETAMINOPHEN 5-325 MG PO TABS
1.0000 | ORAL_TABLET | ORAL | Status: DC | PRN
  Administered 2012-04-09 – 2012-04-10 (×3): 2 via ORAL
  Filled 2012-04-09 (×3): qty 2

## 2012-04-09 MED ORDER — IOHEXOL 300 MG/ML  SOLN
10.0000 mL | Freq: Once | INTRAMUSCULAR | Status: AC | PRN
  Administered 2012-04-09: 10 mL

## 2012-04-09 MED ORDER — CIPROFLOXACIN IN D5W 400 MG/200ML IV SOLN
400.0000 mg | Freq: Once | INTRAVENOUS | Status: AC
  Administered 2012-04-09: 400 mg via INTRAVENOUS

## 2012-04-09 MED ORDER — CIPROFLOXACIN HCL 500 MG PO TABS: 500.0000 mg | ORAL_TABLET | Freq: Two times a day (BID) | ORAL | Status: AC

## 2012-04-09 MED ORDER — OXYCODONE-ACETAMINOPHEN 5-325 MG PO TABS: 1.0000 | ORAL_TABLET | ORAL | Status: AC | PRN

## 2012-04-09 MED ORDER — SODIUM CHLORIDE 0.9 % IV SOLN
Freq: Once | INTRAVENOUS | Status: AC
  Administered 2012-04-09: 08:00:00 via INTRAVENOUS

## 2012-04-09 MED ORDER — MIDAZOLAM HCL 2 MG/2ML IJ SOLN
INTRAMUSCULAR | Status: AC
  Filled 2012-04-09: qty 6

## 2012-04-09 MED ORDER — VITAMIN E 180 MG (400 UNIT) PO CAPS
400.0000 [IU] | ORAL_CAPSULE | Freq: Every day | ORAL | Status: DC
  Administered 2012-04-10: 400 [IU] via ORAL
  Filled 2012-04-09: qty 1

## 2012-04-09 MED ORDER — INSULIN ASPART 100 UNIT/ML ~~LOC~~ SOLN
SUBCUTANEOUS | Status: AC
  Administered 2012-04-09: 5 [IU] via SUBCUTANEOUS
  Filled 2012-04-09: qty 1

## 2012-04-09 MED ORDER — PROPOFOL 10 MG/ML IV EMUL
INTRAVENOUS | Status: DC | PRN
  Administered 2012-04-09: 160 mg via INTRAVENOUS

## 2012-04-09 MED ORDER — CIPROFLOXACIN IN D5W 400 MG/200ML IV SOLN
INTRAVENOUS | Status: AC
  Filled 2012-04-09: qty 200

## 2012-04-09 MED ORDER — ACETAMINOPHEN 325 MG PO TABS
650.0000 mg | ORAL_TABLET | ORAL | Status: DC | PRN
Start: 2012-04-09 — End: 2012-04-10

## 2012-04-09 MED ORDER — GLYCOPYRROLATE 0.2 MG/ML IJ SOLN
INTRAMUSCULAR | Status: DC | PRN
  Administered 2012-04-09: .6 mg via INTRAVENOUS

## 2012-04-09 MED ORDER — ASPIRIN EC 81 MG PO TBEC: 162.0000 mg | DELAYED_RELEASE_TABLET | Freq: Every day | ORAL | Status: DC

## 2012-04-09 MED ORDER — CANAGLIFLOZIN 100 MG PO TABS
1.0000 | ORAL_TABLET | Freq: Every day | ORAL | Status: DC
  Administered 2012-04-10: 1 via ORAL

## 2012-04-09 MED ORDER — VITAMIN C 500 MG PO TABS
500.0000 mg | ORAL_TABLET | Freq: Every day | ORAL | Status: DC
  Administered 2012-04-10: 500 mg via ORAL
  Filled 2012-04-09: qty 1

## 2012-04-09 MED ORDER — HYDROMORPHONE HCL PF 1 MG/ML IJ SOLN
0.5000 mg | INTRAMUSCULAR | Status: DC | PRN
  Administered 2012-04-09: 1 mg via INTRAVENOUS
  Administered 2012-04-10: 0.5 mg via INTRAVENOUS
  Filled 2012-04-09 (×2): qty 1

## 2012-04-09 MED ORDER — KETAMINE HCL 10 MG/ML IJ SOLN
INTRAMUSCULAR | Status: DC | PRN
  Administered 2012-04-09 (×3): 10 mg via INTRAVENOUS

## 2012-04-09 MED ORDER — OXYMETAZOLINE HCL 0.05 % NA SOLN
2.0000 | Freq: Two times a day (BID) | NASAL | Status: DC | PRN
  Filled 2012-04-09: qty 15

## 2012-04-09 MED ORDER — INSULIN ASPART 100 UNIT/ML ~~LOC~~ SOLN
16.0000 [IU] | Freq: Every day | SUBCUTANEOUS | Status: DC
  Administered 2012-04-10: 16 [IU] via SUBCUTANEOUS

## 2012-04-09 MED ORDER — NEBIVOLOL HCL 10 MG PO TABS
20.0000 mg | ORAL_TABLET | Freq: Every day | ORAL | Status: DC
  Administered 2012-04-10: 20 mg via ORAL
  Filled 2012-04-09 (×2): qty 2

## 2012-04-09 MED ORDER — CIPROFLOXACIN HCL 500 MG PO TABS
500.0000 mg | ORAL_TABLET | Freq: Two times a day (BID) | ORAL | Status: DC
  Administered 2012-04-09 – 2012-04-10 (×2): 500 mg via ORAL
  Filled 2012-04-09 (×4): qty 1

## 2012-04-09 MED ORDER — FENTANYL CITRATE 0.05 MG/ML IJ SOLN
INTRAMUSCULAR | Status: AC | PRN
  Administered 2012-04-09: 100 ug via INTRAVENOUS
  Administered 2012-04-09: 50 ug via INTRAVENOUS

## 2012-04-09 MED ORDER — CIPROFLOXACIN IN D5W 400 MG/200ML IV SOLN: 400.0000 mg | Freq: Two times a day (BID) | INTRAVENOUS | Status: DC

## 2012-04-09 MED ORDER — HYDROMORPHONE HCL PF 1 MG/ML IJ SOLN
0.2500 mg | INTRAMUSCULAR | Status: DC | PRN
  Administered 2012-04-09: 0.5 mg via INTRAVENOUS

## 2012-04-09 MED ORDER — EPHEDRINE SULFATE 50 MG/ML IJ SOLN
INTRAMUSCULAR | Status: DC | PRN
  Administered 2012-04-09: 10 mg via INTRAVENOUS

## 2012-04-09 MED ORDER — SODIUM CHLORIDE 0.9 % IV SOLN
INTRAVENOUS | Status: DC
  Administered 2012-04-09 – 2012-04-10 (×2): via INTRAVENOUS

## 2012-04-09 MED ORDER — IOHEXOL 300 MG/ML  SOLN
INTRAMUSCULAR | Status: DC | PRN
  Administered 2012-04-09: 10 mL

## 2012-04-09 MED ORDER — FLUTICASONE PROPIONATE 50 MCG/ACT NA SUSP
1.0000 | Freq: Every day | NASAL | Status: DC | PRN
  Filled 2012-04-09: qty 16

## 2012-04-09 MED ORDER — INSULIN REGULAR HUMAN 100 UNIT/ML IJ SOLN: 5.0000 [IU] | Freq: Two times a day (BID) | INTRAMUSCULAR | Status: DC

## 2012-04-09 MED ORDER — NAPHAZOLINE-PHENIRAMINE 0.025-0.3 % OP SOLN
1.0000 [drp] | Freq: Two times a day (BID) | OPHTHALMIC | Status: DC
  Filled 2012-04-09: qty 15

## 2012-04-09 MED ORDER — NEOSTIGMINE METHYLSULFATE 1 MG/ML IJ SOLN
INTRAMUSCULAR | Status: DC | PRN
  Administered 2012-04-09: 4 mg via INTRAVENOUS

## 2012-04-09 MED ORDER — LIDOCAINE HCL 1 % IJ SOLN
INTRAMUSCULAR | Status: AC
  Filled 2012-04-09: qty 20

## 2012-04-09 MED ORDER — CISATRACURIUM BESYLATE (PF) 10 MG/5ML IV SOLN
INTRAVENOUS | Status: DC | PRN
  Administered 2012-04-09: 10 mg via INTRAVENOUS

## 2012-04-09 MED ORDER — ONDANSETRON HCL 4 MG/2ML IJ SOLN
INTRAMUSCULAR | Status: DC | PRN
  Administered 2012-04-09 (×2): 2 mg via INTRAVENOUS

## 2012-04-09 MED ORDER — SODIUM CHLORIDE 0.9 % IV SOLN
INTRAVENOUS | Status: DC
  Administered 2012-04-09: 1000 mL via INTRAVENOUS

## 2012-04-09 MED ORDER — SUCCINYLCHOLINE CHLORIDE 20 MG/ML IJ SOLN
INTRAMUSCULAR | Status: DC | PRN
  Administered 2012-04-09: 160 mg via INTRAVENOUS

## 2012-04-09 MED ORDER — HYDROMORPHONE HCL PF 1 MG/ML IJ SOLN
INTRAMUSCULAR | Status: AC
  Filled 2012-04-09: qty 1

## 2012-04-09 MED ORDER — FENTANYL CITRATE 0.05 MG/ML IJ SOLN
INTRAMUSCULAR | Status: DC | PRN
  Administered 2012-04-09: 50 ug via INTRAVENOUS
  Administered 2012-04-09: 25 ug via INTRAVENOUS
  Administered 2012-04-09: 150 ug via INTRAVENOUS
  Administered 2012-04-09: 25 ug via INTRAVENOUS

## 2012-04-09 MED ORDER — DOCUSATE SODIUM 100 MG PO CAPS
100.0000 mg | ORAL_CAPSULE | Freq: Two times a day (BID) | ORAL | Status: DC
  Administered 2012-04-09 – 2012-04-10 (×2): 100 mg via ORAL
  Filled 2012-04-09 (×3): qty 1

## 2012-04-09 MED ORDER — IOHEXOL 300 MG/ML  SOLN
INTRAMUSCULAR | Status: AC
  Filled 2012-04-09: qty 2

## 2012-04-09 MED ORDER — SODIUM CHLORIDE 0.9 % IV SOLN
INTRAVENOUS | Status: DC | PRN
  Administered 2012-04-09: 12:00:00 via INTRAVENOUS

## 2012-04-09 MED ORDER — EZETIMIBE 10 MG PO TABS
10.0000 mg | ORAL_TABLET | Freq: Every day | ORAL | Status: DC
  Administered 2012-04-10: 10 mg via ORAL
  Filled 2012-04-09 (×2): qty 1

## 2012-04-09 MED ORDER — PROMETHAZINE HCL 25 MG/ML IJ SOLN: 6.2500 mg | INTRAMUSCULAR | Status: DC | PRN

## 2012-04-09 MED ORDER — HYOSCYAMINE SULFATE 0.125 MG SL SUBL
0.1250 mg | SUBLINGUAL_TABLET | SUBLINGUAL | Status: DC | PRN
  Filled 2012-04-09: qty 1

## 2012-04-09 MED ORDER — MIDAZOLAM HCL 5 MG/5ML IJ SOLN
INTRAMUSCULAR | Status: AC | PRN
  Administered 2012-04-09 (×3): 1 mg via INTRAVENOUS

## 2012-04-09 MED ORDER — CEFAZOLIN SODIUM-DEXTROSE 2-3 GM-% IV SOLR
2.0000 g | Freq: Once | INTRAVENOUS | Status: DC
  Administered 2012-04-09: 2 g via INTRAVENOUS

## 2012-04-09 MED ORDER — HYDROCHLOROTHIAZIDE 12.5 MG PO CAPS
12.5000 mg | ORAL_CAPSULE | Freq: Every day | ORAL | Status: DC
  Administered 2012-04-09 – 2012-04-10 (×2): 12.5 mg via ORAL
  Filled 2012-04-09 (×3): qty 1

## 2012-04-09 MED ORDER — FENTANYL CITRATE 0.05 MG/ML IJ SOLN
INTRAMUSCULAR | Status: AC
  Filled 2012-04-09: qty 6

## 2012-04-09 MED ORDER — INSULIN ASPART 100 UNIT/ML ~~LOC~~ SOLN
4.0000 [IU] | Freq: Every day | SUBCUTANEOUS | Status: DC
  Administered 2012-04-10: 4 [IU] via SUBCUTANEOUS

## 2012-04-09 MED ORDER — HYDROCHLOROTHIAZIDE 25 MG PO TABS
12.5000 mg | ORAL_TABLET | Freq: Every morning | ORAL | Status: DC
  Filled 2012-04-09: qty 0.5

## 2012-04-09 MED ORDER — INSULIN ASPART 100 UNIT/ML ~~LOC~~ SOLN: 5.0000 [IU] | Freq: Once | SUBCUTANEOUS | Status: DC

## 2012-04-09 MED ORDER — ONDANSETRON HCL 4 MG/2ML IJ SOLN: 4.0000 mg | INTRAMUSCULAR | Status: DC | PRN

## 2012-04-09 MED ORDER — SODIUM CHLORIDE 0.9 % IR SOLN
Status: DC | PRN
  Administered 2012-04-09: 3000 mL

## 2012-04-09 MED ORDER — MIDAZOLAM HCL 5 MG/5ML IJ SOLN
INTRAMUSCULAR | Status: DC | PRN
  Administered 2012-04-09 (×2): 0.5 mg via INTRAVENOUS

## 2012-04-09 MED ORDER — CEFAZOLIN SODIUM-DEXTROSE 2-3 GM-% IV SOLR
INTRAVENOUS | Status: AC
  Filled 2012-04-09: qty 50

## 2012-04-09 MED ORDER — LIDOCAINE HCL (CARDIAC) 20 MG/ML IV SOLN
INTRAVENOUS | Status: DC | PRN
  Administered 2012-04-09: 80 mg via INTRAVENOUS

## 2012-04-09 SURGICAL SUPPLY — 42 items
BAG URINE DRAINAGE (UROLOGICAL SUPPLIES) ×2 IMPLANT
BASKET STONE NITINOL 3FRX115MB (UROLOGICAL SUPPLIES) IMPLANT
BENZOIN TINCTURE PRP APPL 2/3 (GAUZE/BANDAGES/DRESSINGS) ×4 IMPLANT
CATH FOLEY 2W COUNCIL 20FR 5CC (CATHETERS) ×4 IMPLANT
CATH ROBINSON RED A/P 20FR (CATHETERS) IMPLANT
CATH URET DUAL LUMEN 6-10FR 50 (CATHETERS) ×2 IMPLANT
CATH X-FORCE N30 NEPHROSTOMY (TUBING) ×2 IMPLANT
CLOTH BEACON ORANGE TIMEOUT ST (SAFETY) ×2 IMPLANT
COVER SURGICAL LIGHT HANDLE (MISCELLANEOUS) ×2 IMPLANT
DRAPE C-ARM 42X72 X-RAY (DRAPES) ×2 IMPLANT
DRAPE CAMERA CLOSED 9X96 (DRAPES) ×2 IMPLANT
DRAPE LINGEMAN PERC (DRAPES) ×2 IMPLANT
DRAPE SURG IRRIG POUCH 19X23 (DRAPES) ×2 IMPLANT
DRAPE UTILITY XL STRL (DRAPES) ×2 IMPLANT
DRSG TEGADERM 8X12 (GAUZE/BANDAGES/DRESSINGS) ×4 IMPLANT
GLOVE BIOGEL M STRL SZ7.5 (GLOVE) ×2 IMPLANT
GOWN STRL REIN XL XLG (GOWN DISPOSABLE) ×2 IMPLANT
KIT BASIN OR (CUSTOM PROCEDURE TRAY) ×2 IMPLANT
LASER FIBER DISP (UROLOGICAL SUPPLIES) IMPLANT
LASER FIBER DISP 1000U (UROLOGICAL SUPPLIES) IMPLANT
MANIFOLD NEPTUNE II (INSTRUMENTS) ×2 IMPLANT
NS IRRIG 1000ML POUR BTL (IV SOLUTION) ×2 IMPLANT
PACK BASIC VI WITH GOWN DISP (CUSTOM PROCEDURE TRAY) ×2 IMPLANT
PAD ABD 7.5X8 STRL (GAUZE/BANDAGES/DRESSINGS) ×2 IMPLANT
PROBE EHL 9 FR 470CM (MISCELLANEOUS) IMPLANT
PROBE LITHOCLAST ULTRA 3.8X403 (UROLOGICAL SUPPLIES) IMPLANT
PROBE PNEUMATIC 1.0MMX570MM (UROLOGICAL SUPPLIES) IMPLANT
SET IRRIG Y TYPE TUR BLADDER L (SET/KITS/TRAYS/PACK) ×2 IMPLANT
SET WARMING FLUID IRRIGATION (MISCELLANEOUS) IMPLANT
SPONGE GAUZE 4X4 12PLY (GAUZE/BANDAGES/DRESSINGS) ×2 IMPLANT
SPONGE LAP 4X18 X RAY DECT (DISPOSABLE) IMPLANT
STENT CONTOUR 6FRX26X.038 (STENTS) ×2 IMPLANT
STENT ENDOURETEROTOMY 7-14 26C (STENTS) IMPLANT
STONE CATCHER W/TUBE ADAPTER (UROLOGICAL SUPPLIES) IMPLANT
SUT SILK 2 0 30  PSL (SUTURE) ×1
SUT SILK 2 0 30 PSL (SUTURE) ×1 IMPLANT
SYR 20CC LL (SYRINGE) ×2 IMPLANT
SYRINGE 10CC LL (SYRINGE) IMPLANT
TOWEL OR NON WOVEN STRL DISP B (DISPOSABLE) ×2 IMPLANT
TRAY FOLEY CATH 14FRSI W/METER (CATHETERS) ×2 IMPLANT
TUBING CONNECTING 10 (TUBING) ×4 IMPLANT
WATER STERILE IRR 1500ML POUR (IV SOLUTION) IMPLANT

## 2012-04-09 NOTE — Progress Notes (Signed)
Notified Dr. Okey Dupre of pt's CBG 267 at 10:10am.  Novolog 5 units ordered pre-op

## 2012-04-09 NOTE — H&P (Signed)
Kristine Gray is an 61 y.o. female.   Chief Complaint: Hx renal stones; Left percutaneous nephrostomy placed 03/19/12 in IR Pt scheduled now for surgery today with Dr Mena Goes for stone retrieval.  Scheduled for Left nephroureteral catheter placement prior to surgery HPI: HTN; DM  Past Medical History  Diagnosis Date  . Hypertension   . Diabetes mellitus 04-02-12    oral med and insulin used  . Arthritis 04-02-12    arthritis lower back and spurs  . Kidney calculi 04-02-12    hx. kidney stones-left, past hx. kidney stones  . Anemia   . Diverticulosis of colon 04-02-12    hx. of, no problems  . H/O nephrostomy 04-02-12    03-19-12 tube placed and remains lt. flank.    Past Surgical History  Procedure Date  . Cholecystectomy   . Tonsillectomy   . Dilation and curettage of uterus 04-02-12  . Tubal ligation 04-02-12     '86  . Iriodotomy 04-02-12    bil. eyes  . Cesarean section 04-02-12    '86    No family history on file. Social History:  reports that she has never smoked. She does not have any smokeless tobacco history on file. She reports that she does not drink alcohol. Her drug history not on file.  Allergies:  Allergies  Allergen Reactions  . Penicillins Hives, Itching and Swelling     (Not in a hospital admission)  No results found for this or any previous visit (from the past 48 hour(s)). No results found.  Review of Systems  Constitutional: Negative for fever.  Respiratory: Negative for shortness of breath.   Cardiovascular: Negative for chest pain.  Gastrointestinal: Negative for nausea and vomiting.  Musculoskeletal: Positive for back pain.  Neurological: Negative for headaches.    Blood pressure 141/65, pulse 70, temperature 98 F (36.7 C), temperature source Oral, resp. rate 16, SpO2 97.00%. Physical Exam  Constitutional: She is oriented to person, place, and time. She appears well-developed and well-nourished.  Cardiovascular: Normal rate, regular rhythm and  normal heart sounds.   No murmur heard. Respiratory: Effort normal and breath sounds normal. She has no wheezes.  GI: Soft. Bowel sounds are normal. There is no tenderness.  Musculoskeletal: Normal range of motion.  Neurological: She is alert and oriented to person, place, and time.  Skin: Skin is warm and dry.  Psychiatric: She has a normal mood and affect. Her behavior is normal. Judgment and thought content normal.     Assessment/Plan Hx renal stones; Lt PCN placed 6/25 Now for OR -  remaining stone retrieval Scheduled for left nephroureteral catheter placement in IR Pt aware of procedure benefits and risks and agreeable to proceed. Consent signed.  Kayd Launer A 04/09/2012, 8:16 AM

## 2012-04-09 NOTE — Brief Op Note (Signed)
04/09/2012  1:25 PM  PATIENT:  Kristine Gray  61 y.o. female  PRE-OPERATIVE DIAGNOSIS:  Left Nephrolithiasis  POST-OPERATIVE DIAGNOSIS:  Left Nephrolithiasis  PROCEDURE:  Procedure(s) (LRB): NEPHROLITHOTOMY PERCUTANEOUS (Left)  SURGEON:  Surgeon(s) and Role:    * Antony Haste, MD - Primary  ANESTHESIA:   general  EBL: minimal  BLOOD ADMINISTERED:none  DRAINS: Urinary Catheter (Foley) 18Fr; 20 Fr left nephrostomy tube; 6 x 26 cm left ureteral stent  SPECIMEN:  Source of Specimen:  kidney stones x 3 (12 mm x 2 and a 6 mm )  DISPOSITION OF SPECIMEN:  family  COUNTS:  YES  DICTATION: .Other Dictation: Dictation Number [id number]  PLAN OF CARE: Admit for overnight observation  PATIENT DISPOSITION:  PACU - hemodynamically stable.   Delay start of Pharmacological VTE agent (>24hrs) due to surgical blood loss or risk of bleeding: yes

## 2012-04-09 NOTE — Anesthesia Postprocedure Evaluation (Signed)
  Anesthesia Post-op Note  Patient: Kristine Gray  Procedure(s) Performed: Procedure(s) (LRB): NEPHROLITHOTOMY PERCUTANEOUS (Left)  Patient Location: PACU  Anesthesia Type: General  Level of Consciousness: awake and alert   Airway and Oxygen Therapy: Patient Spontanous Breathing  Post-op Pain: mild  Post-op Assessment: Post-op Vital signs reviewed, Patient's Cardiovascular Status Stable, Respiratory Function Stable, Patent Airway and No signs of Nausea or vomiting  Post-op Vital Signs: stable  Complications: No apparent anesthesia complications

## 2012-04-09 NOTE — Interval H&P Note (Signed)
History and Physical Interval Note:  04/09/2012 11:40 AM  Kristine Gray  has presented today for surgery, with the diagnosis of Left Nephrolithiasis  The various methods of treatment have been discussed with the patient and family. After consideration of risks, benefits and other options for treatment, the patient has consented to  Procedure(s) (LRB): NEPHROLITHOTOMY PERCUTANEOUS (Left) as a surgical intervention .  The patient's history has been reviewed, patient examined, no change in status, stable for surgery.  I have reviewed the patients' chart, IR images and labs.  Questions were answered to the patient's satisfaction. I also discussed with her that we would give pre-op Ancef and there is a small chance of cross reactivity with PCN's. She elects to proceed with Ancef as well.    Antony Haste

## 2012-04-09 NOTE — Procedures (Signed)
Interventional Radiology Procedure Note  Procedure: Left percutaneous nephrostomy access for PCNL by urology.  A new access was palced via a lower pole calyx.  The existing mid-pole neph tube was converted to a safety catheter and advanced into the bladder. Complications: No immediate Recommendations: To OR for PCNL.  Signed,  Sterling Big, MD Vascular & Interventional Radiologist [facility]

## 2012-04-09 NOTE — Progress Notes (Signed)
Patient ID: Kristine Gray, female   DOB: 1951-02-18, 61 y.o.   MRN: [id number]  Post-op PCNL. Pt without complaint. Minimal pain. No CP or SOB. No emesis.  Urine in Left Nx and foley clear.  A&Ox3 , watching TV Will cap Nx in AM, then remove. Pt has a ureteral stent.

## 2012-04-09 NOTE — OR Nursing (Signed)
Left Renal stones sent with Dr. Mena Goes.

## 2012-04-09 NOTE — Anesthesia Preprocedure Evaluation (Signed)
Anesthesia Evaluation  Patient identified by MRN, date of birth, ID band Patient awake    Reviewed: Allergy & Precautions, H&P , NPO status , Patient's Chart, lab work & pertinent test results  Airway Mallampati: III TM Distance: <3 FB Neck ROM: Full    Dental No notable dental hx.    Pulmonary neg pulmonary ROS,  breath sounds clear to auscultation  Pulmonary exam normal       Cardiovascular hypertension, Pt. on medications Rhythm:Regular Rate:Normal     Neuro/Psych negative neurological ROS  negative psych ROS   GI/Hepatic negative GI ROS, Neg liver ROS,   Endo/Other  Insulin DependentMorbid obesity  Renal/GU negative Renal ROS  negative genitourinary   Musculoskeletal negative musculoskeletal ROS (+)   Abdominal   Peds negative pediatric ROS (+)  Hematology negative hematology ROS (+)   Anesthesia Other Findings   Reproductive/Obstetrics negative OB ROS                           Anesthesia Physical Anesthesia Plan  ASA: III  Anesthesia Plan: General   Post-op Pain Management:    Induction: Intravenous  Airway Management Planned: Oral ETT  Additional Equipment:   Intra-op Plan:   Post-operative Plan: Extubation in OR  Informed Consent: I have reviewed the patients History and Physical, chart, labs and discussed the procedure including the risks, benefits and alternatives for the proposed anesthesia with the patient or authorized representative who has indicated his/her understanding and acceptance.   Dental advisory given  Plan Discussed with: CRNA and Surgeon  Anesthesia Plan Comments:         Anesthesia Quick Evaluation

## 2012-04-09 NOTE — Transfer of Care (Signed)
Immediate Anesthesia Transfer of Care Note  Patient: Kristine Gray  Procedure(s) Performed: Procedure(s) (LRB): NEPHROLITHOTOMY PERCUTANEOUS (Left)  Patient Location: PACU  Anesthesia Type: General  Level of Consciousness: awake and alert   Airway & Oxygen Therapy: Patient Spontanous Breathing and Patient connected to face mask oxygen  Post-op Assessment: Report given to PACU RN and Post -op Vital signs reviewed and stable  Post vital signs: Reviewed and stable  Complications: No apparent anesthesia complications

## 2012-04-10 ENCOUNTER — Encounter (HOSPITAL_COMMUNITY): Payer: Self-pay | Admitting: Urology

## 2012-04-10 LAB — BASIC METABOLIC PANEL
CO2: 27 mEq/L (ref 19–32)
Calcium: 8.7 mg/dL (ref 8.4–10.5)
Glucose, Bld: 210 mg/dL — ABNORMAL HIGH (ref 70–99)
Potassium: 4 mEq/L (ref 3.5–5.1)
Sodium: 135 mEq/L (ref 135–145)

## 2012-04-10 LAB — CBC
Hemoglobin: 10.7 g/dL — ABNORMAL LOW (ref 12.0–15.0)
MCH: 29.3 pg (ref 26.0–34.0)
MCV: 90.4 fL (ref 78.0–100.0)
Platelets: 228 10*3/uL (ref 150–400)
RBC: 3.65 MIL/uL — ABNORMAL LOW (ref 3.87–5.11)
WBC: 11 10*3/uL — ABNORMAL HIGH (ref 4.0–10.5)

## 2012-04-10 LAB — GLUCOSE, CAPILLARY
Glucose-Capillary: 208 mg/dL — ABNORMAL HIGH (ref 70–99)
Glucose-Capillary: 259 mg/dL — ABNORMAL HIGH (ref 70–99)

## 2012-04-10 MED ORDER — ACETAMINOPHEN-CODEINE #3 300-30 MG PO TABS: 1.0000 | ORAL_TABLET | ORAL | Status: AC | PRN

## 2012-04-10 NOTE — Discharge Summary (Signed)
Physician Discharge Summary  Patient ID: Kristine Gray MRN: [id number] DOB/AGE: [birth year] 62 y.o.  Admit date: 04/09/2012 Discharge date: 04/10/2012  Admission Diagnoses: left nephrolithiasis  Discharge Diagnoses:  Left nephrolithiasis  Discharged Condition: good  Hospital Course: Patient admitted after left PCNL. POD#1 her left Nx was clamped and foley removed. She did well. She was tolerating a regular diet, voiding without difficulty, ambulating and had minimal pain. Her left Nx was removed and she was discharged to home.   Consults: None  Significant Diagnostic Studies: none   Treatments: surgery: Left PCNL  Discharge Exam: Blood pressure 110/51, pulse 74, temperature 98.2 F (36.8 C), temperature source Oral, resp. rate 18, height 5' 4.17" (1.63 m), weight 112.1 kg (247 lb 2.2 oz), SpO2 97.00%. NAD A&Ox3 Abd - soft, NT Ext - no CCE  Disposition: 01-Home or Self Care   Medication List  As of 04/10/2012  9:53 PM   TAKE these medications         acetaminophen 500 MG tablet   Commonly known as: TYLENOL   Take 500 mg by mouth every 6 (six) hours as needed. For pain      acetaminophen-codeine 300-30 MG per tablet   Commonly known as: TYLENOL #3   Take 1 tablet by mouth every 4 (four) hours as needed. For pain      acetaminophen-codeine 300-30 MG per tablet   Commonly known as: TYLENOL #3   Take 1 tablet by mouth every 4 (four) hours as needed for pain.      aspirin EC 81 MG tablet   Take 2 tablets (162 mg total) by mouth daily with breakfast.   Start taking on: 04/14/2012      BLACK COHOSH PO   Take 1 tablet by mouth 2 (two) times daily.      BYSTOLIC 20 MG Tabs   Generic drug: Nebivolol HCl   Take 1 tablet by mouth daily with breakfast.      calcium carbonate 600 MG Tabs   Commonly known as: OS-CAL   Take 1,500 mg by mouth daily.      ciprofloxacin 500 MG tablet   Commonly known as: CIPRO   Take 500 mg by mouth 2 (two) times daily. For 10 days and  then continued for 14 more days      ciprofloxacin 500 MG tablet   Commonly known as: CIPRO   Take 1 tablet (500 mg total) by mouth 2 (two) times daily.      EYE ALLERGY RELIEF 0.027-0.315 % Soln   Generic drug: Naphazoline-Pheniramine   Apply 1 drop to eye 2 (two) times daily.      ezetimibe 10 MG tablet   Commonly known as: ZETIA   Take 10 mg by mouth daily with breakfast.      Fish Oil 1000 MG Caps   Take 1 capsule by mouth 2 (two) times daily.      fluticasone 50 MCG/ACT nasal spray   Commonly known as: FLONASE   Place 1 spray into the nose daily as needed. For congestion      folic acid 400 MCG tablet   Commonly known as: FOLVITE   Take 400 mcg by mouth daily.      hydrochlorothiazide 12.5 MG tablet   Commonly known as: HYDRODIURIL   Take 12.5 mg by mouth every morning.      insulin regular 100 units/mL injection   Commonly known as: NOVOLIN R,HUMULIN R   Inject 5-16 Units into the skin 2 (two)  times daily before a meal. Takes 16 units in the morning and 4 units at night      INVOKANA 100 MG Tabs   Generic drug: Canagliflozin   Take 1 tablet by mouth daily.      magnesium oxide 400 MG tablet   Commonly known as: MAG-OX   Take 400 mg by mouth daily with breakfast.      NASAL DECONGESTANT SPRAY 0.05 % nasal spray   Generic drug: oxymetazoline   Place 2 sprays into the nose as needed. For nasal congestion      oxyCODONE-acetaminophen 5-325 MG per tablet   Commonly known as: PERCOCET/ROXICET   Take 1 tablet by mouth every 4 (four) hours as needed for pain.      PRESCRIPTION MEDICATION   Take 1 tablet by mouth daily with breakfast. invokana      rosuvastatin 20 MG tablet   Commonly known as: CRESTOR   Take 20 mg by mouth daily with breakfast.      TRILIPIX 135 MG capsule   Generic drug: Choline Fenofibrate   Take 135 mg by mouth daily with breakfast.      vitamin C 500 MG tablet   Commonly known as: ASCORBIC ACID   Take 500 mg by mouth daily.       Vitamin D3 2000 UNITS Tabs   Take 1 tablet by mouth daily.      vitamin E 400 UNIT capsule   Take 400 Units by mouth daily.      zinc gluconate 50 MG tablet   Take 50 mg by mouth daily with breakfast.           Follow-up Information    Follow up with Antony Haste, MD. (April 24, 2012; 11:30 AM for cystoscopy with stent removal)    Contact information:   [address] [facility] [address] [facility] [address] [facility] [place] [address] [phone]          Signed: Antony Haste 04/10/2012, 9:53 PM

## 2012-04-10 NOTE — Progress Notes (Signed)
Spoke with Mrs Lightle and husband at bedside. She remembered this [place] Liaison from previous hospital admission at [facility]. Confirmed her dc plans and reminded her about [employer] to [employer] and benefits. She states she will follow up with [facility] after vacation. Link to Wellness with [facility] is a benefit of her [employer] plan. Contact information left with patient and she was informed that she will receive post dc telephone call. Confirmed her telephone number for call back.   Raiford Noble, MSN-Ed, RN, BSN [facility] Liaison [phone]

## 2012-04-10 NOTE — Progress Notes (Signed)
Patient ID: Kristine Gray, female   DOB: 07-04-1951, 60 y.o.   MRN: [id number]  Pt without complaint. Voiding well. No flank pain.  Urine in hat - light pink - red - no clots. Looks good.   Left Nx tube removed.   Imp - POD#1 left PCNL Plan - D/C home

## 2012-04-10 NOTE — Op Note (Signed)
NAMEYANIRA, TOLSMA NO.:  [id number]  MEDICAL RECORD NO.:  [id number]  LOCATION:  1414                         FACILITY:  [facility]  PHYSICIAN:  Jerilee Field, MD   DATE OF BIRTH:  25-May-1951  DATE OF PROCEDURE: DATE OF DISCHARGE:                              OPERATIVE REPORT   PREOPERATIVE DIAGNOSIS:  Left nephrolithiasis.  POSTOPERATIVE DIAGNOSIS:  Left nephrolithiasis.  PROCEDURE:  Left percutaneous nephrolithotomy.  SURGEON:  Jerilee Field, MD  TYPE OF ANESTHESIA:  General.  INDICATION FOR PROCEDURE:  Ms. Toulouse is a 61 year old female presented with a left UPJ stone and pyelonephritis with sepsis.  She underwent percutaneous nephrostomy and urine cultures ended up growing E. coli.  She was treated and now presents for stone removal.  She had 3 stones in the left kidney; 1 at the UPJ and 2 in the left lower pole. We discussed the nature, risks, benefits and alternatives to left percutaneous nephrolithotomy.  All questions were answered and she elected to proceed with percutaneous nephrolithotomy.  FINDINGS:  All 3 stones were found and retrieved intact from the collecting system without difficulty.  After placement of the nephrostomy tube, nephrostogram revealed no extravasation and brisk drainage of contrast down the ureter.  A 6 x 26-cm left ureteral stent was left in place.  DESCRIPTION OF PROCEDURE:  After consent was obtained, the patient was brought to the operating room.  A time-out was performed to confirm the patient and procedure.  After adequate anesthesia, she was placed prone and all pressure points were carefully padded.  The left nephrostomy tubes were then prepped and draped in the usual sterile fashion.  The more inferior new access into the lower pole was accessed with a superstiff wire which was coiled in the bladder under fluoroscopic guidance.  The nephroureteral catheter was removed, then a dual-lumen catheter was placed  into the midureter.  A second superstiff wire was then advanced and coiled in the bladder.  The dual-lumen was removed. The balloon was then placed with the radiopaque marker at the most inferior lower pole stone and the balloon inflated to dilate the tract. The skin had been incised.  The access sheath was then placed and the nephroscope was then inserted into the collecting system.  The sheath was right on the lower pole stones and these were grasped and removed intact with 2-prong graspers.  I was able to peer through the infundibulum with the nephroscope and see the other large stone at the UPJ.  This was grasped from a distance with the 2-prong grasper, carefully pulled and manipulated.  It was aligned with the infundibulum and carefully pulled through the infundibulum and then removed intact without difficulty.  Fluoroscopy revealed no further stones and these 3 stones were the only stones on CT.  The working wire was backloaded on the scope and a 6 x 26 cm stent was placed and the wire was removed.  A good coil was seen in the bladder and a good coil in the left renal pelvis.  Through the nephroscope, the working wire was placed back into the renal pelvis as I was working through the sheath and the nephroscope was removed.  Over the wire,  a 20-French Council tip catheter was placed through the nephrostomy tube.  The balloon was inflated with 1 mL.  Nephrostogram was obtained, which revealed no extravasation and excellent efflux of contrast through the renal pelvis and down the ureter.  The sheath was then carefully backed out over the nephrostomy tube, and the nephrostomy tube was secured in place. Postmanipulation fluoro confirmed the tube was still in the collecting system, in the proper place.  Under fluoroscopic guidance, the middle pole access which had been converted to a nephroureteral stent was removed without difficulty.  In a similar fashion through the lower pole access,  the safety wire was removed without difficulty, leaving the 6 x 26 ureteral stent in proper position and the nephrostomy tube in proper position.  The nephrostomy tube was irrigated normally.  It was connected to gravity drainage.  A pressure dressing was applied.  The patient was then placed supine, she was awakened and taken to the recovery room in stable condition.  ESTIMATED BLOOD LOSS:  Minimal.  DRAINS: 1. An 18-French Foley. 2. A 20-French left nephrostomy tube. 3. Left 6 x 26-cm left ureteral stent.  COMPLICATIONS:  None.  SPECIMENS:  Three stones which were given to the family.  I instructed them to bring those back to the office after they inspect them for analysis.          ______________________________ Jerilee Field, MD     ME/MEDQ  D:  04/09/2012  T:  04/10/2012  Job:  [id number]

## 2012-04-10 NOTE — Progress Notes (Signed)
Inpatient Diabetes Program Recommendations  AACE/ADA: New Consensus Statement on Inpatient Glycemic Control (2009)  Target Ranges:  Prepandial:   less than 140 mg/dL      Peak postprandial:   less than 180 mg/dL (1-2 hours)      Critically ill patients:  140 - 180 mg/dL   Reason for assessment: Glucose not optimally controlled for this hospitalized patient.   Inpatient Diabetes Program Recommendations Insulin - Basal: Lantus 15 units (titrate for fasting <140) Correction (SSI): Novolog MODERATE scale TID   Will follow during this admission.  Thank you  Piedad Climes [facility] Inpatient Diabetes Coordinator [phone]

## 2012-04-10 NOTE — Progress Notes (Signed)
Patient ID: Kristine Gray, female   DOB: 1950/11/30, 61 y.o.   MRN: [id number]  Patient without complaint.  NAD A&)x3 Abd - soft, NT Left NX - urine light pink. Foley - urine clear  Imp - POD#1 left PCNL  Plan - SLIV, Nx capped, d/c foley Will d/c PNx later today if pt is doing well.

## 2012-04-11 NOTE — Care Management Note (Signed)
    Page 1 of 1   04/11/2012     11:33:12 AM   CARE MANAGEMENT NOTE 04/11/2012  Patient:  Kristine Gray, Kristine Gray   Account Number:  [id number]  Date Initiated:  04/11/2012  Documentation initiated by:  Lanier Clam  Subjective/Objective Assessment:   ADMITTED W/L FLANK PAIN.     Action/Plan:   FROM HOME W/SPOUSE   Anticipated DC Date:  04/10/2012   Anticipated DC Plan:  HOME/SELF CARE      DC Planning Services  CM consult      Choice offered to / List presented to:             Status of service:  Completed, signed off Medicare Important Message given?   (If response is "NO", the following Medicare IM given date fields will be blank) Date Medicare IM given:   Date Additional Medicare IM given:    Discharge Disposition:  HOME/SELF CARE  Per UR Regulation:  Reviewed for med. necessity/level of care/duration of stay  If discussed at Long Length of Stay Meetings, dates discussed:    Comments:

## 2012-12-28 ENCOUNTER — Encounter: Payer: Self-pay | Admitting: Dietician

## 2012-12-28 ENCOUNTER — Encounter: Payer: 59 | Attending: Family Medicine | Admitting: Dietician

## 2012-12-28 VITALS — Ht 64.0 in | Wt 255.0 lb

## 2012-12-28 DIAGNOSIS — Z713 Dietary counseling and surveillance: Secondary | ICD-10-CM | POA: Insufficient documentation

## 2012-12-28 DIAGNOSIS — E119 Type 2 diabetes mellitus without complications: Secondary | ICD-10-CM | POA: Insufficient documentation

## 2012-12-28 NOTE — Progress Notes (Signed)
Patient was seen on 12/28/2012 for the complete series of three diabetes self-management courses at [facility]. Current A1c = 10.1%  on 12/17/2012  The following learning objectives were met by the patient during this course: Core 1:  Gain an understanding of diabetes and what causes it  Learn how diabetes is treated and the goals of treatment  Learn why, how and when to test BG  Learn how carbohydrate affects your glucose  Receive a personal food plan and learn how to count carbohydrates  Discover how physical activity enhances glucose control and overall health  Begin to gain confidence in your ability to manage diabetes  Handouts given during this class include:  Type 2 Diabetes: Basics Book  My Food Plan Book  Food and Activity Log  Core 2:  Describe causes, symptoms and treatment of hypoglycemia and hyperglycemia  Learn how to care for your glucose meter and strips  Explain how to manage diabetes during illness  List strategies to follow meal plan when dining out  Describe the effects of alcohol on glucose and how to use it safely  Describe problem solving skills for day-to-day glucose challenges  Describe ways to remain physically active  Describe the impact of regular activity on insulin resistance  Handouts given in this class:  Refrigerator magnet for Sick Day Guidelines  [facility] Oral Medication and Insulin handout  Core 3  Describe how diabetes changes over time  Understand why glucose Kristine Gray be out of target  Learn how diabetes changes over time  Learn about blood pressure, cholesterol, and heart health  Learn about lowering dietary fat and sodium  Understand the benefits of physical activity for heart health  Develop problem solving skills for times when glucose numbers are puzzling  Develop strategies for creating life balance  Learn how to identify if your treatment plan needs to change  Develop  strategies for dealing with stress, depression and staying motivated  Identify healthy weight-loss plans  Gain confidence that you can succeed in caring for your diabetes  Establish 2-3 goals that they will plan to diligently work on until they return                   for the free 5-month follow-up visit   The following handouts were given in class:  3 Month Follow Up Visit handout  Goal setting handout  Class evaluation form  Your patient has established the following 3 month goals for diabetes self-care:  Count carbohydrates at most meals and snacks  Increase my activity at least 1 day a week   Follow-Up Plan: Patient was offered a 3 month follow-up visit for diabetes self-management education.

## 2012-12-28 NOTE — Patient Instructions (Signed)
Goals:  Follow Diabetes Meal Plan as instructed  Eat 3 meals and 2 snacks, every 3-5 hrs  Limit carbohydrate intake to 30-45 grams carbohydrate/meal  Limit carbohydrate intake to 15 grams carbohydrate/snack  Add lean protein foods to meals/snacks  Monitor glucose levels as instructed by your doctor  Aim for 30 mins of physical activity daily  Bring food record and glucose log to your next nutrition visit 

## 2012-12-31 ENCOUNTER — Ambulatory Visit: Payer: 59 | Admitting: *Deleted

## 2013-01-09 ENCOUNTER — Encounter: Payer: Self-pay | Admitting: *Deleted

## 2013-01-09 ENCOUNTER — Encounter: Payer: 59 | Admitting: *Deleted

## 2013-01-09 VITALS — Ht 64.0 in | Wt 257.2 lb

## 2013-01-09 DIAGNOSIS — E119 Type 2 diabetes mellitus without complications: Secondary | ICD-10-CM

## 2013-01-09 NOTE — Progress Notes (Signed)
Patient was diagnosed with Diabetes in 1995, not 3-4 years ago.

## 2013-01-09 NOTE — Progress Notes (Signed)
  Medical Nutrition Therapy:  Appt start time: 1600 end time:  1700.  Assessment:  Primary concerns today: patient here for diabetes as well as history of kidney stones and on Low Oxalate Diet. She attended the Saturday Core Class on 12/28/2012 but has additonal health issues that need some attention. She has had Diabetes for 3-4 years and reports taking Humulin R u-500 insulin @ 18 units in AM before breakfast and 5-6 units in the evening usually after her supper. She SMBG 4-6 times a day with BG ranging from 45-300+ mg/dl. She records her BG, insulin doses and food intake in her log book and notebook including the amount of carbohydrates eaten at meals and snacks.  MEDICATIONS: see list. Diabetes medication includes Humulin R u-500 insulin @ 18 units in AM before breakfast and 5-6 units in the evening    DIETARY INTAKE:  Usual eating pattern includes 3 meals and 2 snacks per day.  Everyday foods include good variety of all food groups.  Avoided foods include Oxalate containing foods now.    Usual physical activity: not discussed at this visit  Estimated energy needs: 1400 calories 158 g carbohydrates 105 g protein 39 g fat  Progress Towards Goal(s):  In progress.   Nutritional Diagnosis:  NI-1.5 Excessive energy intake As related to activity level.  As evidenced by BMI of 44.2 .    Intervention:  Patient expresses very good understanding of Carb Counting with the exception of her portion size for her oatmeal. She was counting 1/2 cup uncooked oatmeal as 15 grams of Carbohydrate when it is actually 1/2 cup cooked, so she was consuming more carbs at that meal than reported. These meals correlated with a higher BG pre lunch. Also reviewed that her insulin is 5 times stronger than Humulin R but that the action times are the same. Explained rationale for her to take her insulin prior to the meal rather than after as she often does at supper. She agreed to start taking it before supper now. Also  provided her with list of foods with 15 grams of Carbohydrate and marked through the foods she needs to avoid on the Low Oxalate Diet to incorporate both diets on same reference card. Would like to work with her further on the concept of adjusting her insulin based on her carb intake as when she tries to lower her food intake now, she risks hypoglycemia. I'd like to recommend she wear iPro CGMS to better assess her BG patterns.   Plan:  Aim for 3 Carb Choices per meal (45 grams) +/- 1 either way  Aim for 0-1 Carbs per snack if hungry  Continue reading food labels for Total Carbohydrate of foods Consider checking BG at alternate times per day as directed by MD  Continue taking medication: Humulin R 500 as directed by MD Consider taking evening dose before supper instead of after to help with evening BG management   Handouts given during visit include: Meal Plan Card with Low Oxalate Diet foods marked through  Insulin Action Medication Sheet with u-500 insulin added  Monitoring/Evaluation:  Dietary intake, exercise, take evening insulin prior to supper meal, and body weight in 4 week(s).

## 2013-01-09 NOTE — Patient Instructions (Signed)
Plan:  Aim for 3 Carb Choices per meal (45 grams) +/- 1 either way  Aim for 0-1 Carbs per snack if hungry  Continue reading food labels for Total Carbohydrate of foods Consider checking BG at alternate times per day as directed by MD  Continue taking medication: Humulin R 500 as directed by MD Consider taking evening dose before supper instead of after to help with evening BG management  

## 2013-02-06 ENCOUNTER — Encounter: Payer: 59 | Attending: Family Medicine | Admitting: *Deleted

## 2013-02-06 ENCOUNTER — Encounter: Payer: Self-pay | Admitting: *Deleted

## 2013-02-06 VITALS — Ht 64.0 in | Wt 257.4 lb

## 2013-02-06 DIAGNOSIS — Z713 Dietary counseling and surveillance: Secondary | ICD-10-CM | POA: Insufficient documentation

## 2013-02-06 DIAGNOSIS — E119 Type 2 diabetes mellitus without complications: Secondary | ICD-10-CM | POA: Insufficient documentation

## 2013-02-06 NOTE — Progress Notes (Signed)
  Medical Nutrition Therapy:  Appt start time: 1145 end time:  1245.  Assessment:  Primary concerns today: patient here for follow up visit for diabetes as well as history of kidney stones and on Low Oxalate Diet. MD has omitted one diabetes medication and added Tradjenta. Still has hyperglycemia when she consumes higher carb foods such as banana and milk.  She is adding weights to her exercise routine and has increased frequency to 3-4 times a week. She states she is walking at [employer] or [employer] every other week as well as walking in garden at home more often. She states she is making healthier portion size decisions, and is eating salads more often at home. She is choosing lower sodium meats for sandwiches and she is measuring her Carb portion sizes.  MEDICATIONS: see list. Diabetes medication includes Humulin R u-500 insulin @ 19 units in AM before breakfast and 6-7 units in the evening    DIETARY INTAKE:  Usual eating pattern includes 3 meals and 2 snacks per day.  Everyday foods include good variety of all food groups.  Avoided foods include Oxalate containing foods now.    Usual physical activity: not discussed at this visit  Estimated energy needs: 1400 calories 158 g carbohydrates 105 g protein 39 g fat  Progress Towards Goal(s):  In progress.   Nutritional Diagnosis:  NI-1.5 Excessive energy intake As related to activity level.  As evidenced by BMI of 44.2 .    Intervention: Commended her on her many behavior changes and improved eating and exercise habits. She has added her number of Carb Choices to her BG Log book and is now taking her evening dose of U-500 before her supper rather than after it. I briefly mentioned the possibility of an insulin pump due to her high doses of insulin and the potential of analog drip providing a better coverage in addition to adding meal time insulin that can be adjusted more accurately for her. She expressed mixed feelings about the idea.    I'd like to recommend she wear iPro CGMS to better assess her BG patterns. She plans to discuss with Dr. Evlyn Kanner at their next visit and if he agrees he may send a referral to [facility] for CGM.   Plan:  Continue with 2-3 Carb Choices per meal (30-45 grams) +/- 1 either way  Continue with 0-2 Carbs per snack if hungry  Continue reading food labels for Total Carbohydrate of foods Continue checking BG at alternate times per day as directed by MD  Continue taking medication: Humulin R 500 as directed by MD Continue taking evening dose before supper instead of after to help with evening BG management   Handouts given during visit include:  No new handouts at this visit  Monitoring/Evaluation:  Dietary intake, exercise, take evening insulin prior to supper meal, and body weight in 4 week(s).

## 2013-03-04 ENCOUNTER — Encounter: Payer: 59 | Attending: Family Medicine | Admitting: *Deleted

## 2013-03-04 ENCOUNTER — Encounter: Payer: Self-pay | Admitting: *Deleted

## 2013-03-04 VITALS — Ht 64.0 in | Wt 254.3 lb

## 2013-03-04 DIAGNOSIS — Z713 Dietary counseling and surveillance: Secondary | ICD-10-CM | POA: Insufficient documentation

## 2013-03-04 DIAGNOSIS — E119 Type 2 diabetes mellitus without complications: Secondary | ICD-10-CM | POA: Insufficient documentation

## 2013-03-04 NOTE — Progress Notes (Signed)
  Medical Nutrition Therapy:  Appt start time: 1115 end time:  1145.  Assessment:  Primary concerns today: patient here for follow up visit for diabetes as well as history of kidney stones and on Low Oxalate Diet. States she is doing well with 3 Carb Meal plan +/- 1 either way, she states it is easier based on food groups. Also states she is feeling fuller and eating less. Weight loss of 3 pounds noted. Adding extra vegetables also fills her plate. Continues to SMBG daily with current average on meter @ 190 mg/dl which she states is an improvement from around 285 mg/dl. Continues to walk around the garden and grocery stores, continues to add weights to exercises she does at home for 5 minutes 2-3 times each day. No hypoglycemia except a 77 this AM.  MEDICATIONS: see list. Diabetes medication includes Humulin R u-500 insulin @ 19 units in AM before breakfast and 6-7 units in the evening    DIETARY INTAKE:  Usual eating pattern includes 3 meals and 2 snacks per day.  Everyday foods include good variety of all food groups.  Avoided foods include Oxalate containing foods now.    Usual physical activity: not discussed at this visit  Estimated energy needs: 1400 calories 158 g carbohydrates 105 g protein 39 g fat  Progress Towards Goal(s):  In progress.   Nutritional Diagnosis:  NI-1.5 Excessive energy intake As related to activity level.  As evidenced by BMI of 44.2, now down to 43.7 .    Intervention: She is doing well, expresses a feeling of accomplishment with better BGs and weight loss. Plans to continue with her current eating habits and SMBG schedule. Not interested in pursuing the pump idea, mild interest in wearing iPro CGM for several days to assess her BG patterns at this time.   Plan:  Continue with 2-3 Carb Choices per meal (30-45 grams) +/- 1 either way  Continue with 0-2 Carbs per snack if hungry  Continue reading food labels for Total Carbohydrate of foods Continue checking  BG at alternate times per day as directed by MD  Continue taking medication: Humulin R 500 as directed by MD Continue taking evening dose before supper instead of after to help with evening BG management   Handouts given during visit include:  No new handouts at this visit  Monitoring/Evaluation:  Dietary intake, exercise, take evening insulin prior to supper meal, and body weight PRN. She will call after her next MD appointment if needed.

## 2013-04-28 ENCOUNTER — Ambulatory Visit: Payer: 59 | Admitting: *Deleted

## 2013-05-19 IMAGING — CT CT ABDOMEN W/O CM
2 of 4 series · 15 of 46 positions shown, 17 images · non-contrast
Comparison: 03/18/2012

CLINICAL DATA: Left pyonephrosis, post nephrostomy catheter
placement.

CT ABDOMEN WITHOUT CONTRAST
TECHNIQUE: Multidetector CT imaging of the abdomen was performed
following the standard protocol without IV contrast.

[Series 2: abd/pelv w/o 5.0 b31f st · axial · non-contrast · 0.92mm/px · z∈[-268,+2]mm · 12 of 60 slices shown, 14 images]
[im 3/60  soft-tissue]
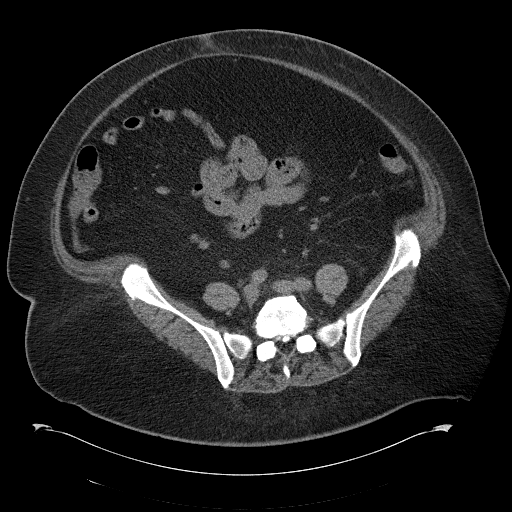
[im 3/60  bone]
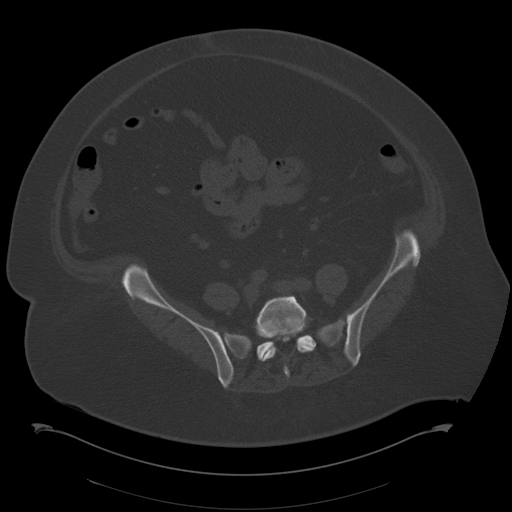
[im 8/60  soft-tissue]
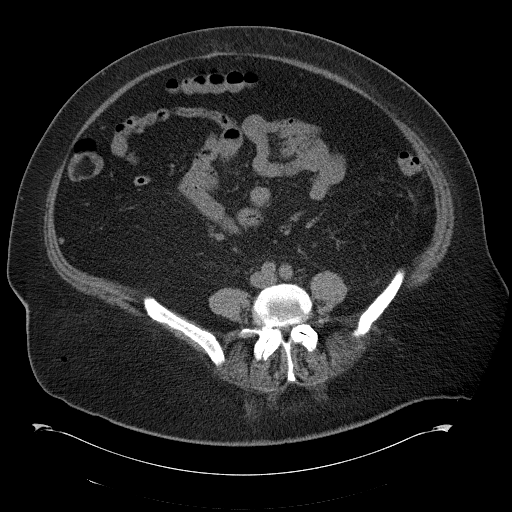
[im 13/60  soft-tissue]
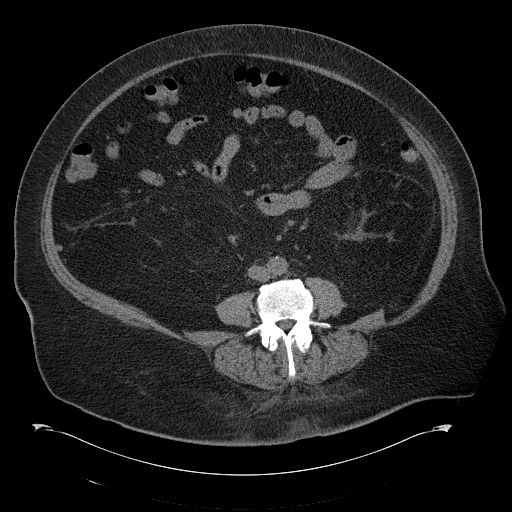
[im 18/60  soft-tissue]
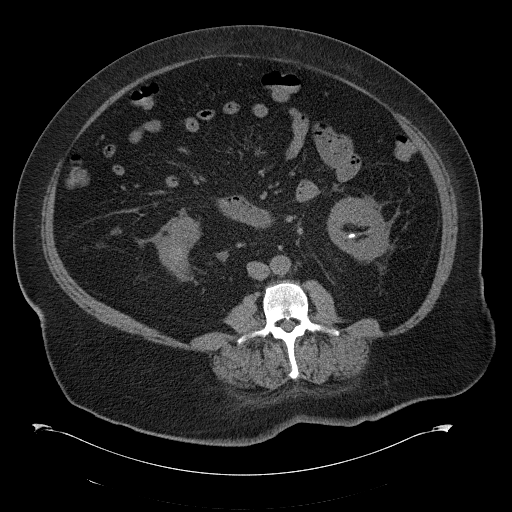
[im 24/60  soft-tissue]
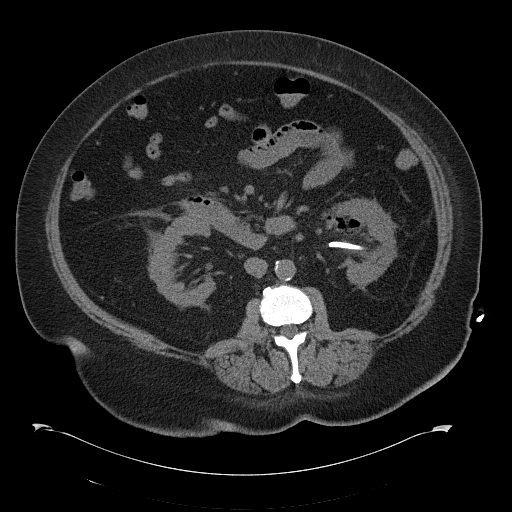
[im 29/60  soft-tissue]
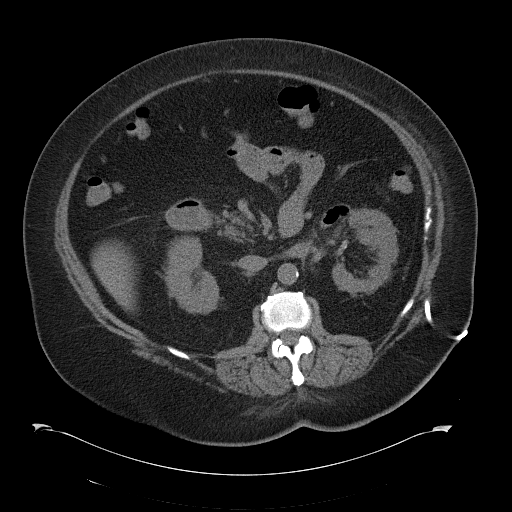
[im 31/60  soft-tissue]
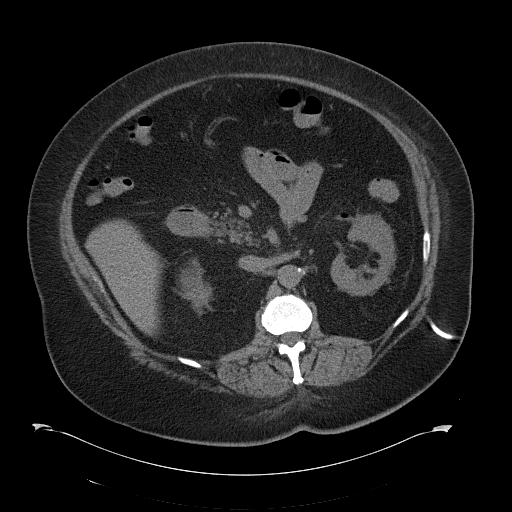
[im 36/60  soft-tissue]
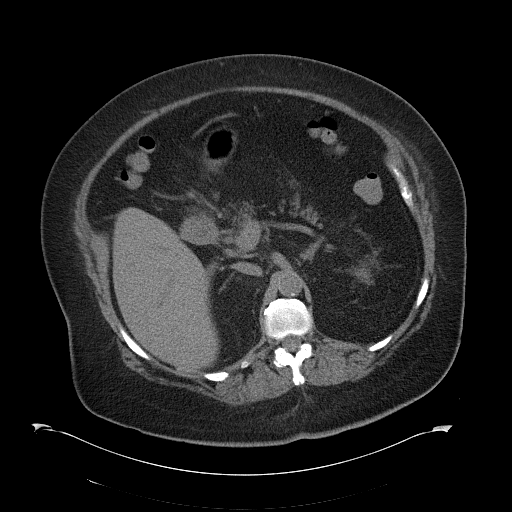
[im 42/60  soft-tissue]
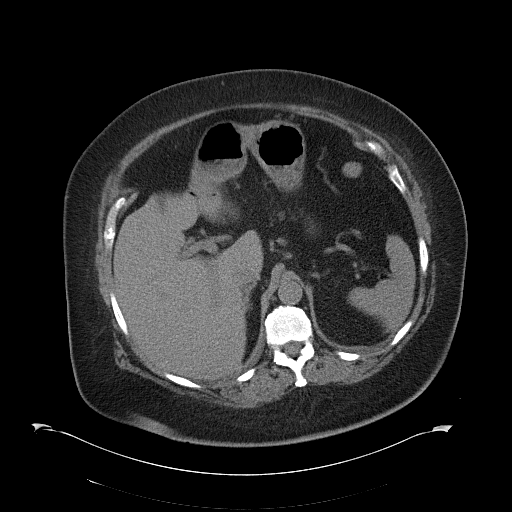
[im 42/60  bone]
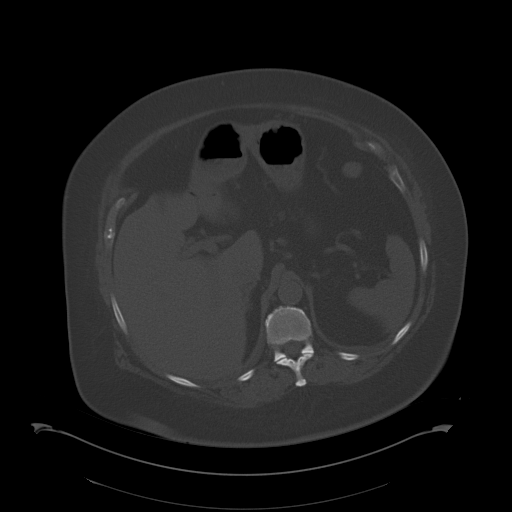
[im 47/60  soft-tissue]
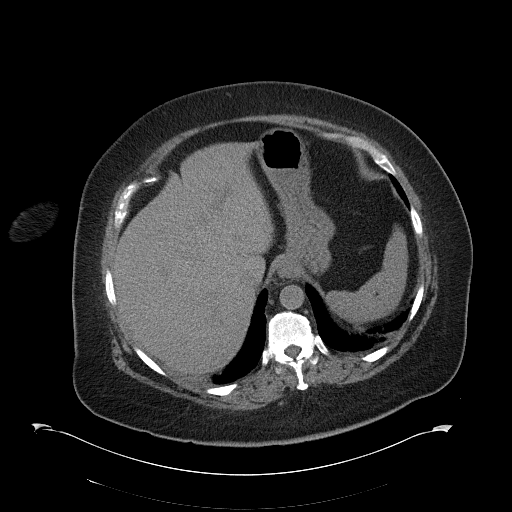
[im 52/60  soft-tissue]
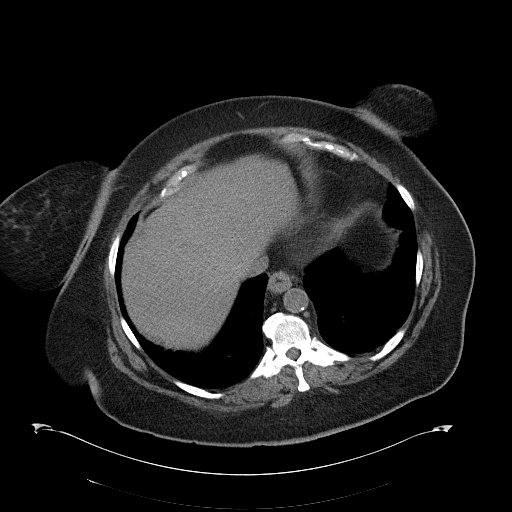
[im 57/60  soft-tissue]
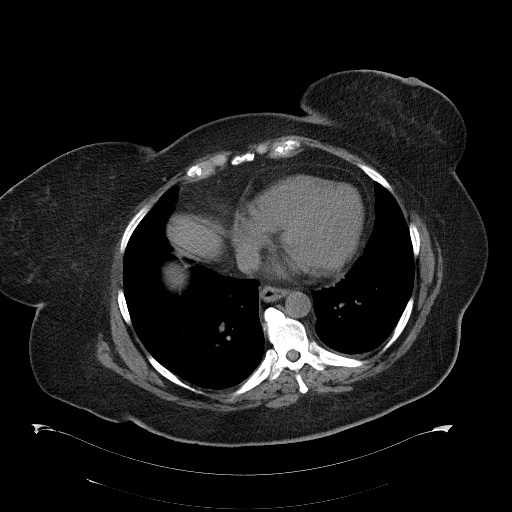

[Series 5: coronals · coronal · 0.57mm/px · 3 of 170 slices shown]
[im 57/170  soft-tissue]
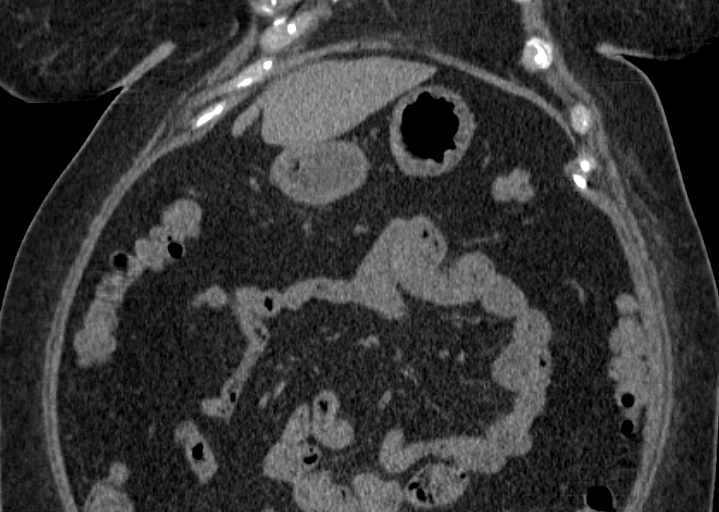
[im 76/170  soft-tissue]
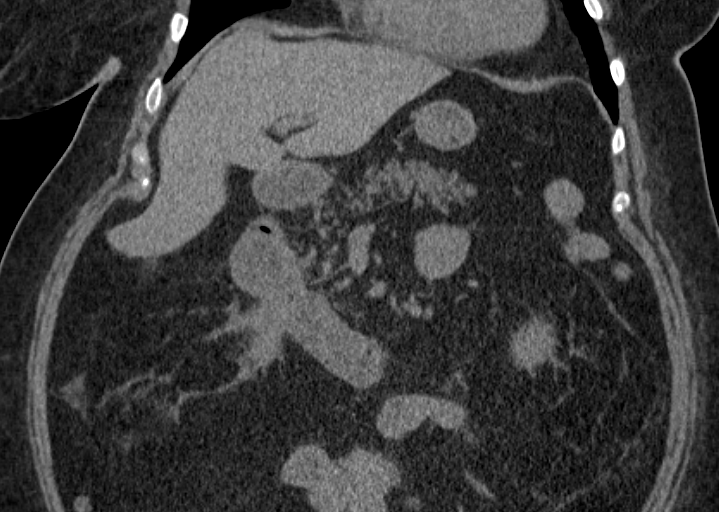
[im 94/170  soft-tissue]
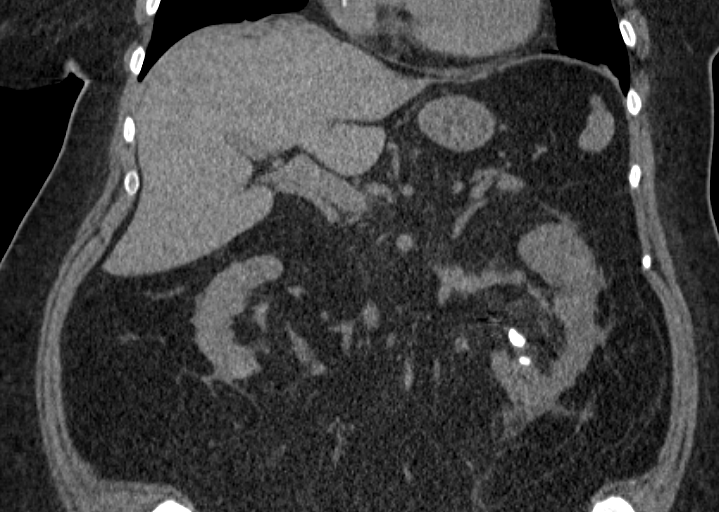

[15 of 46 positions shown; findings below may reference images not displayed]

FINDINGS: Interval placement of a left percutaneous nephrostomy
catheter which is well positioned.  Persistent left nephrolithiasis
with the largest 12 mm stone near the UPJ.  There is a persistent
2.8 cm gas collection at the   anteromedial margin of the kidney,
with some adjacent smaller gas pockets, decreased in overall size
since previous exam, all contained within Gerota's fascia.  There
is no significant fluid component. There are mild regional
inflammatory/edematous changes in the retroperitoneal fat as
before.  This is not contiguous with bowel.  No hydronephrosis.

Visualized lung bases clear.  Coronary calcifications noted.
Vascular clips in the gallbladder fossa.  Unremarkable uninfused
evaluation of liver, spleen, adrenal glands, right kidney,
pancreas, stomach, and visualized portions of small bowel and
appendix and colon. No free air.  No ascites.  Patchy aortic
calcified plaque without aneurysm. Spondylitic changes in the lower
lumbar spine.
IMPRESSION: 1.  Stable position of left nephrostomy catheter; no
hydronephrosis.
2.  Interval decrease in the amount of gas in and around the left
renal collecting system.
3.  Left nephrolithiasis including a 12 mm UPJ calculus as before.
4. Atherosclerosis, including [...] coronary artery disease. Please
note that although the presence of coronary artery calcium
documents the presence of coronary artery disease, the severity of
this disease and any potential stenosis cannot be assessed on this
non-gated CT examination.  Assessment for potential risk factor
modification, dietary therapy or pharmacologic therapy may be
warranted, if clinically indicated.

## 2013-06-05 ENCOUNTER — Ambulatory Visit (INDEPENDENT_AMBULATORY_CARE_PROVIDER_SITE_OTHER): Payer: 59 | Admitting: Family Medicine

## 2013-06-05 DIAGNOSIS — E119 Type 2 diabetes mellitus without complications: Secondary | ICD-10-CM

## 2013-06-05 NOTE — Progress Notes (Signed)
Patient presents today for DM follow-up as part of employer-sponsored Link to [employer]. Medications, glucose readings, a1c and compliance have been reviewed. I have also discussed with patient lifestyle interventions including diet and exercise. Details of the visit can be found in [employer] documenting program through [employer] [facility]). Patient has set a series of personal goals and will follow-up in no more than 3 months for further review of DM. Last a1c: 8.1 on 04/23/13 with Dr. Evlyn Kanner. Today's discussion focused heavily on what transitions would be made as dental pain resolves and Kristine Gray is able to get back into exercise and expanding dietary options.

## 2013-08-12 ENCOUNTER — Ambulatory Visit (INDEPENDENT_AMBULATORY_CARE_PROVIDER_SITE_OTHER): Payer: Self-pay | Admitting: Family Medicine

## 2013-08-12 DIAGNOSIS — E119 Type 2 diabetes mellitus without complications: Secondary | ICD-10-CM

## 2013-08-12 NOTE — Progress Notes (Signed)
Patient presents today for DM follow-up as part of employer-sponsored [employer] to [employer]. Medications, glucose readings, a1c and compliance have been reviewed. I have also discussed with patient lifestyle interventions including diet and exercise. Details of the visit can be found in [employer] documenting program through [employer] [facility]). Patient has set a series of personal goals and will follow-up in no more than 3 months for further review of DM. Today's a1c = 8.8%. Flu shot already obtained for 2014-15.

## 2013-08-13 NOTE — Progress Notes (Signed)
Patient ID: Kristine Gray, female   DOB: 1951/01/14, 62 y.o.   MRN: [id number] ATTENDING PHYSICIAN NOTE: I have reviewed the chart and agree with the plan as detailed above. Denny Levy MD Pager [phone]

## 2013-08-20 ENCOUNTER — Other Ambulatory Visit: Payer: Self-pay | Admitting: Endocrinology

## 2013-08-20 DIAGNOSIS — Z1231 Encounter for screening mammogram for malignant neoplasm of breast: Secondary | ICD-10-CM

## 2013-09-16 NOTE — Progress Notes (Signed)
Patient ID: Kristine Gray, female   DOB: 02/11/1951, 62 y.o.   MRN: [id number] ATTENDING PHYSICIAN NOTE: I have reviewed the chart and agree with the plan as detailed above. Vanassa Penniman MD Pager [phone]  

## 2013-10-10 ENCOUNTER — Ambulatory Visit
Admission: RE | Admit: 2013-10-10 | Discharge: 2013-10-10 | Disposition: A | Discharge status: Home / Self Care | Payer: 59 | Source: Ambulatory Visit | Attending: Endocrinology | Admitting: Endocrinology

## 2013-10-10 DIAGNOSIS — Z1231 Encounter for screening mammogram for malignant neoplasm of breast: Secondary | ICD-10-CM

## 2013-12-19 ENCOUNTER — Ambulatory Visit (INDEPENDENT_AMBULATORY_CARE_PROVIDER_SITE_OTHER): Payer: Self-pay | Admitting: Family Medicine

## 2013-12-19 VITALS — BP 176/92 | HR 72 | Wt 260.6 lb

## 2013-12-19 DIAGNOSIS — E119 Type 2 diabetes mellitus without complications: Secondary | ICD-10-CM

## 2013-12-19 NOTE — Progress Notes (Signed)
Patient presents today for DM follow-up as part of employer-sponsored Link to Wellness Program. Medications, glucose readings, a1c and compliance have been reviewed. I have also discussed with patient lifestyle interventions including diet and exercise. Details of the visit can be found in [employer] documenting program through [facility] Patient has set a series of personal goals and will follow-up in no more than 3 months for further review of DM. 

## 2014-03-18 ENCOUNTER — Ambulatory Visit (INDEPENDENT_AMBULATORY_CARE_PROVIDER_SITE_OTHER): Payer: Self-pay | Admitting: Family Medicine

## 2014-03-18 VITALS — BP 150/78 | Ht 64.0 in | Wt 261.6 lb

## 2014-03-18 DIAGNOSIS — E119 Type 2 diabetes mellitus without complications: Secondary | ICD-10-CM

## 2014-03-18 NOTE — Progress Notes (Signed)
Patient presents today for DM follow-up as part of employer-sponsored Link to Wellness Program. Medications, glucose readings, a1c and compliance have been reviewed. I have also discussed with patient lifestyle interventions including diet and exercise. Details of the visit can be found in [employer] documenting program through [facility] Patient has set a series of personal goals and will follow-up in no more than 3 months for further review of DM. 

## 2014-04-16 NOTE — Progress Notes (Signed)
Patient ID: Kristine Gray, female   DOB: 05/12/1951, 63 y.o.   MRN: [id number] ATTENDING PHYSICIAN NOTE: I have reviewed the chart and agree with the plan as detailed above. Olvin Rohr MD Pager [phone]  

## 2014-04-16 NOTE — Progress Notes (Signed)
Patient ID: Kristine Gray, female   DOB: 12/04/1950, 63 y.o.   MRN: [id number] ATTENDING PHYSICIAN NOTE: I have reviewed the chart and agree with the plan as detailed above. Alyviah Crandle MD Pager [phone]  

## 2014-05-26 ENCOUNTER — Ambulatory Visit (INDEPENDENT_AMBULATORY_CARE_PROVIDER_SITE_OTHER): Payer: Self-pay | Admitting: Family Medicine

## 2014-05-26 VITALS — BP 172/88 | HR 76 | Ht 64.0 in | Wt 261.2 lb

## 2014-05-26 DIAGNOSIS — E119 Type 2 diabetes mellitus without complications: Secondary | ICD-10-CM

## 2014-05-26 NOTE — Progress Notes (Signed)
Patient presents today for DM follow-up as part of employer-sponsored Link to Wellness Program. Medications, glucose readings, a1c and compliance have been reviewed. I have also discussed with patient lifestyle interventions including diet and exercise. Details of the visit can be found in [employer] documenting program through [facility] Patient has set a series of personal goals and will follow-up in no more than 3 months for further review of DM. 

## 2014-05-27 ENCOUNTER — Encounter: Payer: Self-pay | Admitting: *Deleted

## 2014-05-27 ENCOUNTER — Encounter: Payer: 59 | Attending: Family Medicine | Admitting: *Deleted

## 2014-05-27 DIAGNOSIS — E119 Type 2 diabetes mellitus without complications: Secondary | ICD-10-CM

## 2014-05-27 DIAGNOSIS — M1A00X1 Idiopathic chronic gout, unspecified site, with tophus (tophi): Secondary | ICD-10-CM | POA: Diagnosis not present

## 2014-05-27 DIAGNOSIS — Z713 Dietary counseling and surveillance: Secondary | ICD-10-CM | POA: Insufficient documentation

## 2014-05-27 DIAGNOSIS — E1129 Type 2 diabetes mellitus with other diabetic kidney complication: Secondary | ICD-10-CM | POA: Insufficient documentation

## 2014-05-27 DIAGNOSIS — E1122 Type 2 diabetes mellitus with diabetic chronic kidney disease: Secondary | ICD-10-CM

## 2014-05-27 DIAGNOSIS — N189 Chronic kidney disease, unspecified: Secondary | ICD-10-CM | POA: Insufficient documentation

## 2014-05-27 NOTE — Progress Notes (Signed)
  Medical Nutrition Therapy:  Appt start time: 1400 end time:  1500.  Assessment:  Primary concerns today: patient here for follow up visit for Low Purine Diet in addition to diabetes and history of kidney stones. She states her A1c had decreased to 8.3%. She states she developed Gout and started on Allopurinol with good results. But she would like information pertaining to the foods that affect uric acid levels too.  MEDICATIONS: see list. Diabetes medication includes Humulin R u-500 insulin @ 18 units in AM before breakfast and 5-7 units in the evening    DIETARY INTAKE:  Usual eating pattern includes 3 meals and 2 snacks per day.  Everyday foods include good variety of all food groups.  Avoided foods include Oxalate and Uric Acid containing foods now.    Breakfast: glass of milk, 2 slices bread with PNB OR scrambled eggs, 2 slices bacon in microwave, occasionally an apple,  Snack: not usually Lunch: may be light meal- sandwich with fresh fruit, diet lemonade or water Snack: popcorn Supper: casserole with bread, OR lean meat, starch and vegetable or salad, water or diet caffeine free soda Snack: popcorn OR cheese sticks with 6 crackers  Usual physical activity: not discussed at this visit  Estimated energy needs: 1400 calories 158 g carbohydrates 105 g protein 39 g fat  Progress Towards Goal(s):  In progress.   Nutritional Diagnosis:  NI-1.5 Excessive energy intake As related to activity level.  As evidenced by BMI of 44.2, now 44.9 .    Intervention: Today's visit was concentrated on Low Purine Diet and review of allowed as well as foods to be avoided, and why.   Plan:  Consider avoiding foods high in Purine to assist with management of Gout Continue with 2-3 Carb Choices per meal (30-45 grams) +/- 1 either way  Continue with 0-2 Carbs per snack if hungry  Continue reading food labels for Total Carbohydrate of foods Continue checking BG at alternate times per day as directed  by MD  Continue taking medication: Humulin R 500 as directed by MD Continue taking evening dose before supper instead of after to help with evening BG management   Handouts given during visit include:  Low Purine Diet  Monitoring/Evaluation:  Dietary intake, exercise, take evening insulin prior to supper meal, and body weight PRN.

## 2014-07-09 NOTE — Progress Notes (Signed)
Patient ID: Kristine Gray, female   DOB: 10-14-1950, 63 y.o.   MRN: [id number] Reviewed: Agree with the documentation and management of our [place].

## 2014-10-19 ENCOUNTER — Ambulatory Visit (INDEPENDENT_AMBULATORY_CARE_PROVIDER_SITE_OTHER): Payer: Self-pay | Admitting: Family Medicine

## 2014-10-19 VITALS — BP 155/82 | HR 72 | Ht 64.0 in | Wt 270.0 lb

## 2014-10-19 DIAGNOSIS — E119 Type 2 diabetes mellitus without complications: Secondary | ICD-10-CM

## 2014-10-19 NOTE — Progress Notes (Signed)
Patient presents today for DM follow-up as part of employer-sponsored Link to Wellness Program. Medications, glucose readings, a1c and compliance have been reviewed. I have also discussed with patient lifestyle interventions including diet and exercise. Details of the visit can be found in [employer] documenting program through [facility] Patient has set a series of personal goals and will follow-up in no more than 3 months for further review of DM. 

## 2014-11-17 NOTE — Progress Notes (Signed)
Patient ID: Kristine Gray, female   DOB: 1950/11/03, 64 y.o.   MRN: [id number] ATTENDING PHYSICIAN NOTE: I have reviewed the chart and agree with the plan as detailed above. Dorcas Mcmurray MD Pager [phone]

## 2015-02-23 ENCOUNTER — Ambulatory Visit (INDEPENDENT_AMBULATORY_CARE_PROVIDER_SITE_OTHER): Payer: Self-pay | Admitting: Family Medicine

## 2015-02-23 VITALS — BP 157/76 | HR 73 | Ht 64.0 in | Wt 270.8 lb

## 2015-02-23 DIAGNOSIS — E1122 Type 2 diabetes mellitus with diabetic chronic kidney disease: Secondary | ICD-10-CM

## 2015-02-23 DIAGNOSIS — N189 Chronic kidney disease, unspecified: Secondary | ICD-10-CM

## 2015-02-23 NOTE — Progress Notes (Signed)
Subjective:  Patient presents today for 3 month diabetes follow-up as part of the employer-sponsored Link to Wellness program.  Current diabetes regimen includes Humulin R 500 AM,Noon,PM.  Patient also continues on daily ASA and statin.  Most recent MD follow-up was 02/10/2015.  Patient has a pending appt for 06/07/15.  Only recent changes to regimen include a change from [place] to [place] and some minor adjustments to Humulin R protocol (18-10-6 Units daily).    Assessment/Plan:  Patient is a 64 yo female with DM 2. Most recent A1c is 8.2% which is exceeding goal of less than 7%, but continues to trend in the right direction.  yo female/female with DM 1/2. Weight is stable since last visit with me.  Lifestyle improvements:  Physical Activity-  Only activity is a byproduct of normal ADLs (e.g. Walking around [employer] or folding clothes.  Nutrition-  Mostly compliant with Diet, but enough cheating to prevent any weight loss (e.g. [employer] last night)  Goals for Next Visit:  1. Talk with Dr. Moshe Cipro about whether she finds it appropriate to add an ACE Inhibitor or an ARB. 2. Add time to the week for dedicated exercise. Normal activities of daily living do NOT count (but keep up the great work there). 3. Monitor calories for weight loss.  Next appointment to see me is: Sep 21 @ 11AM

## 2015-02-23 NOTE — Patient Instructions (Signed)
Goals for next visit:  1. Talk with Dr. Moshe Cipro about whether she finds it appropriate to add an ACE Inhibitor or an ARB. 2. Add time to the week for dedicated exercise. Normal activities of daily living do NOT count (but keep up the great work there). 3. Monitor calories for weight loss.

## 2015-03-02 NOTE — Progress Notes (Signed)
Patient ID: Kristine Gray, female   DOB: 01-Feb-1951, 64 y.o.   MRN: [id number] ATTENDING PHYSICIAN NOTE: I have reviewed the chart and agree with the plan as detailed above. Dorcas Mcmurray MD Pager [phone]

## 2015-04-27 ENCOUNTER — Other Ambulatory Visit: Payer: Self-pay

## 2015-04-27 DIAGNOSIS — Z1231 Encounter for screening mammogram for malignant neoplasm of breast: Secondary | ICD-10-CM

## 2015-06-02 ENCOUNTER — Ambulatory Visit
Admission: RE | Admit: 2015-06-02 | Discharge: 2015-06-02 | Disposition: A | Discharge status: Home / Self Care | Payer: 59 | Source: Ambulatory Visit

## 2015-06-02 DIAGNOSIS — Z1231 Encounter for screening mammogram for malignant neoplasm of breast: Secondary | ICD-10-CM

## 2015-06-23 ENCOUNTER — Encounter: Payer: Self-pay | Admitting: Pharmacist

## 2015-06-23 ENCOUNTER — Ambulatory Visit (INDEPENDENT_AMBULATORY_CARE_PROVIDER_SITE_OTHER): Payer: Self-pay | Admitting: Family Medicine

## 2015-06-23 VITALS — BP 146/72 | HR 75 | Ht 64.0 in | Wt 275.4 lb

## 2015-06-23 DIAGNOSIS — E119 Type 2 diabetes mellitus without complications: Secondary | ICD-10-CM

## 2015-06-23 NOTE — Progress Notes (Signed)
Subjective:  Patient presents today for 3 month diabetes follow-up as part of the employer-sponsored Link to Wellness program.  Current diabetes regimen includes Humulin R 500 Kwikpen and Januvia 50mg . Patient also continues on daily ASA and stain (to follow up with Dr. Moshe Cipro on ACE/ARB). Most recent MD followup was 06/18/15, to follow up in late January 2017. No major changes at this time.  Assessment:  Diabetes: Most recent A1C was   8.9% which is /exceeding goal of less than 7%. Weight is increased from last visit with me by ~5 lbs.  Lifestyle improvements:  Physical Activity-  Pt. Continues to not have any true physical activity. She is not particularly interested in starting anything.  Nutrition-  Diet has suffered with recent trip to [place] which meant more travel and stress and less eating options. Blood glucoses have improved since return, largely attributable to improved diet.  Follow up with me in 3 months.    Plan/Goals for Next Visit:  1. Call Dr. Moshe Cipro for a follow up visit. Ask her about ACE-I or ARB and whether she finds that to be appropriate. 2. Continue improving diet since return. Calories should be your focus for weight loss.   Next appointment to see me is: 09/23/15 @ 11AM

## 2015-07-05 NOTE — Progress Notes (Signed)
I have reviewed this chart documentation

## 2015-09-17 ENCOUNTER — Other Ambulatory Visit (HOSPITAL_BASED_OUTPATIENT_CLINIC_OR_DEPARTMENT_OTHER): Payer: Self-pay | Admitting: Family Medicine

## 2015-09-17 ENCOUNTER — Ambulatory Visit (HOSPITAL_BASED_OUTPATIENT_CLINIC_OR_DEPARTMENT_OTHER)
Admission: RE | Admit: 2015-09-17 | Discharge: 2015-09-17 | Disposition: A | Discharge status: Home / Self Care | Payer: 59 | Source: Ambulatory Visit | Attending: Family Medicine | Admitting: Family Medicine

## 2015-09-17 DIAGNOSIS — L723 Sebaceous cyst: Secondary | ICD-10-CM | POA: Insufficient documentation

## 2015-09-17 DIAGNOSIS — R221 Localized swelling, mass and lump, neck: Secondary | ICD-10-CM | POA: Diagnosis present

## 2015-09-17 DIAGNOSIS — E041 Nontoxic single thyroid nodule: Secondary | ICD-10-CM | POA: Insufficient documentation

## 2015-09-23 ENCOUNTER — Ambulatory Visit (INDEPENDENT_AMBULATORY_CARE_PROVIDER_SITE_OTHER): Payer: Self-pay | Admitting: Family Medicine

## 2015-09-23 ENCOUNTER — Encounter: Payer: Self-pay | Admitting: Pharmacist

## 2015-09-23 VITALS — BP 165/79 | HR 73 | Ht 64.0 in | Wt 278.2 lb

## 2015-09-23 DIAGNOSIS — E119 Type 2 diabetes mellitus without complications: Secondary | ICD-10-CM

## 2015-09-23 DIAGNOSIS — Z794 Long term (current) use of insulin: Secondary | ICD-10-CM

## 2015-09-23 NOTE — Progress Notes (Signed)
Subjective:  Patient presents today for 3 month diabetes follow-up as part of the employer-sponsored Link to Wellness program.  Current diabetes regimen includes Januvia and Humulin R-500. Patient also continues on daily statin and ASA. To start ARB within a few months. Most recent PCP follow up was 06/18/15, to see again 10/18/15. No significant health changes at this time.. .  Patient also continues on daily ASA, ACE Inhibitor and statin.  Most recent MD follow-up was...  Patient has a pending appt for...  No med changes or major health changes at this time.    Assessment:  Diabetes: Most recent A1C was 8.9% which is exceeding goal of less than 7%. Weight is increased from last visit with me.   Lifestyle improvements:  Physical Activity- Has added some small infrequent exercising.  Nutrition- Focus remains on diet control and eating good diabetic choices and good renal choices and good purine choices.  Will discontinue hctz and Bystolic soon and start valsartan/hctz.   Follow up with me in 3 months.    Plan/Goals for Next Visit:  1. Follow Dr. Shelva Majestic recommendations on stopping Bystolic and hctz and starting valsartan/htz. 2. Follow up with Dr. Forde Dandy in late January. 3. Pay close attention when you've had a low. Specificially, pay attention to timing of food to insulin and pay attention to what you ate when you have a low (for example: was it low/no carb?).   Follow Up: Thursday, March 30th, 2017 @ 11AM

## 2015-09-30 NOTE — Progress Notes (Signed)
I have reviewed this pharmacist's note and agree  

## 2015-10-06 MED FILL — HUMULIN R 500 UNITS/ML KWIK: 500 | 30 days supply | Qty: 12 | Fill #5

## 2015-10-06 MED FILL — VALSARTAN-HCTZ 160-25 MG TA: 160-25 | 90 days supply | Qty: 90 | Fill #0

## 2015-10-06 MED FILL — ONE TOUCH ULTRA TEST STRIPS: 43 days supply | Qty: 300 | Fill #2

## 2015-10-06 MED FILL — ATORVASTATIN 20 MG TABLET: 20 | 90 days supply | Qty: 90 | Fill #1

## 2015-10-06 MED FILL — BD PEN NDL MINI 31GX5MM: 31G X 5 MM | 30 days supply | Qty: 100 | Fill #2

## 2015-10-18 DIAGNOSIS — I10 Essential (primary) hypertension: Secondary | ICD-10-CM | POA: Diagnosis not present

## 2015-10-18 DIAGNOSIS — E784 Other hyperlipidemia: Secondary | ICD-10-CM | POA: Diagnosis not present

## 2015-10-18 DIAGNOSIS — D649 Anemia, unspecified: Secondary | ICD-10-CM | POA: Diagnosis not present

## 2015-10-18 DIAGNOSIS — N200 Calculus of kidney: Secondary | ICD-10-CM | POA: Diagnosis not present

## 2015-10-18 DIAGNOSIS — R740 Nonspecific elevation of levels of transaminase and lactic acid dehydrogenase [LDH]: Secondary | ICD-10-CM | POA: Diagnosis not present

## 2015-10-18 DIAGNOSIS — N08 Glomerular disorders in diseases classified elsewhere: Secondary | ICD-10-CM | POA: Diagnosis not present

## 2015-10-18 DIAGNOSIS — E049 Nontoxic goiter, unspecified: Secondary | ICD-10-CM | POA: Diagnosis not present

## 2015-10-18 DIAGNOSIS — E669 Obesity, unspecified: Secondary | ICD-10-CM | POA: Diagnosis not present

## 2015-10-18 DIAGNOSIS — E1129 Type 2 diabetes mellitus with other diabetic kidney complication: Secondary | ICD-10-CM | POA: Diagnosis not present

## 2015-11-08 MED FILL — JANUVIA 50 MG TABLET: 50 | 90 days supply | Qty: 90 | Fill #0

## 2015-11-08 MED FILL — ALLOPURINOL 100 MG TABLET: 100 | 90 days supply | Qty: 180 | Fill #1

## 2015-11-10 MED FILL — COLCRYS 0.6 MG TABLET: 0.6 | 30 days supply | Qty: 30 | Fill #0

## 2015-11-15 MED FILL — BD PEN NDL MINI 31GX5MM: 31G X 5 MM | 90 days supply | Qty: 300 | Fill #0

## 2015-11-16 MED FILL — HUMULIN R 500 UNITS/ML KWIK: 500 | 29 days supply | Qty: 12 | Fill #0

## 2015-11-18 DIAGNOSIS — Z23 Encounter for immunization: Secondary | ICD-10-CM | POA: Diagnosis not present

## 2015-11-18 DIAGNOSIS — Z794 Long term (current) use of insulin: Secondary | ICD-10-CM | POA: Diagnosis not present

## 2015-11-18 DIAGNOSIS — E7800 Pure hypercholesterolemia, unspecified: Secondary | ICD-10-CM | POA: Diagnosis not present

## 2015-11-18 DIAGNOSIS — N182 Chronic kidney disease, stage 2 (mild): Secondary | ICD-10-CM | POA: Diagnosis not present

## 2015-11-18 DIAGNOSIS — I10 Essential (primary) hypertension: Secondary | ICD-10-CM | POA: Diagnosis not present

## 2015-11-18 DIAGNOSIS — E1122 Type 2 diabetes mellitus with diabetic chronic kidney disease: Secondary | ICD-10-CM | POA: Diagnosis not present

## 2015-11-18 DIAGNOSIS — N183 Chronic kidney disease, stage 3 (moderate): Secondary | ICD-10-CM | POA: Diagnosis not present

## 2015-12-02 MED FILL — HYDROCHLOROTHIAZIDE 25 MG T: 25 | 90 days supply | Qty: 90 | Fill #0

## 2015-12-02 MED FILL — BYSTOLIC 10 MG TABLET: 10 | 90 days supply | Qty: 180 | Fill #0

## 2015-12-23 ENCOUNTER — Encounter: Payer: Self-pay | Admitting: Pharmacist

## 2015-12-23 ENCOUNTER — Ambulatory Visit (INDEPENDENT_AMBULATORY_CARE_PROVIDER_SITE_OTHER): Payer: Self-pay | Admitting: Family Medicine

## 2015-12-23 VITALS — BP 165/66 | HR 74 | Ht 64.0 in | Wt 276.8 lb

## 2015-12-23 DIAGNOSIS — Z794 Long term (current) use of insulin: Secondary | ICD-10-CM

## 2015-12-23 DIAGNOSIS — E119 Type 2 diabetes mellitus without complications: Secondary | ICD-10-CM

## 2015-12-23 MED FILL — ONE TOUCH ULTRA TEST STRIPS: 43 days supply | Qty: 300 | Fill #3

## 2015-12-23 MED FILL — ATORVASTATIN 20 MG TABLET: 20 | 90 days supply | Qty: 90 | Fill #2

## 2015-12-23 MED FILL — HUMULIN R 500 UNITS/ML KWIK: 500 | 29 days supply | Qty: 12 | Fill #1

## 2015-12-23 NOTE — Progress Notes (Signed)
Subjective:  Patient presents today for 3 month diabetes follow-up as part of the employer-sponsored Link to Wellness program.  Current diabetes regimen includes Humulin R-500 and Januva. Patient also continues on daily ASA and statin, but no longer on an ACEI nor ARB due to intolerance. Most recent MD followup was ~10/18/2015 with a pending appointment for ~01/20/2016. No significant health changes or med changes at this time (other than the previously noted start and stop of valsartan).    Assessment:  Diabetes: Most recent A1C was 8.6% which is exceeding goal of less than 7%. Weight is stable from last visit with me.   Lifestyle improvements:  Physical Activity- She and her husband Eddie Dibbles have joined [employer] and are working out ~2 times per week, but have recently been too busy and have fallen out of the habit. She is going to restart ASAP. She is doing circuit weight training and some cardio.  Nutrition- Same as previous. Still finding it difficult to manage her diet given limitations.  Follow up with me in 3 months.    Plan/Goals for Next Visit:  1. Restart regular exercise at the gym. Remember how good you're feeling! 2. Report any issues with heart rate to MD.    Next appointment to see me is: June 29@11AM 

## 2016-01-06 NOTE — Progress Notes (Signed)
I have reviewed this pharmacist's note and agree  

## 2016-01-31 MED FILL — BD PEN NDL MINI 31GX5MM: 31G X 5 MM | 90 days supply | Qty: 300 | Fill #1

## 2016-01-31 MED FILL — ALLOPURINOL 100 MG TABLET: 100 | 90 days supply | Qty: 180 | Fill #2

## 2016-01-31 MED FILL — JANUVIA 50 MG TABLET: 50 | 90 days supply | Qty: 90 | Fill #1

## 2016-01-31 MED FILL — COLCRYS 0.6 MG TABLET: 0.6 | 30 days supply | Qty: 30 | Fill #1

## 2016-01-31 MED FILL — HUMULIN R 500 UNITS/ML KWIK: 500 | 29 days supply | Qty: 12 | Fill #2

## 2016-02-15 DIAGNOSIS — E1129 Type 2 diabetes mellitus with other diabetic kidney complication: Secondary | ICD-10-CM | POA: Diagnosis not present

## 2016-02-15 DIAGNOSIS — E784 Other hyperlipidemia: Secondary | ICD-10-CM | POA: Diagnosis not present

## 2016-02-15 DIAGNOSIS — H4089 Other specified glaucoma: Secondary | ICD-10-CM | POA: Diagnosis not present

## 2016-02-15 DIAGNOSIS — D6489 Other specified anemias: Secondary | ICD-10-CM | POA: Diagnosis not present

## 2016-02-15 DIAGNOSIS — N200 Calculus of kidney: Secondary | ICD-10-CM | POA: Diagnosis not present

## 2016-02-15 DIAGNOSIS — E048 Other specified nontoxic goiter: Secondary | ICD-10-CM | POA: Diagnosis not present

## 2016-02-15 DIAGNOSIS — I10 Essential (primary) hypertension: Secondary | ICD-10-CM | POA: Diagnosis not present

## 2016-02-15 DIAGNOSIS — N08 Glomerular disorders in diseases classified elsewhere: Secondary | ICD-10-CM | POA: Diagnosis not present

## 2016-02-15 DIAGNOSIS — M109 Gout, unspecified: Secondary | ICD-10-CM | POA: Diagnosis not present

## 2016-03-09 MED FILL — HUMULIN R 500 UNITS/ML KWIK: 500 | 90 days supply | Qty: 36 | Fill #3

## 2016-03-09 MED FILL — HYDROCHLOROTHIAZIDE 25 MG T: 25 | 90 days supply | Qty: 90 | Fill #1

## 2016-03-09 MED FILL — ONE TOUCH ULTRA TEST STRIPS: 43 days supply | Qty: 300 | Fill #4

## 2016-03-09 MED FILL — ATORVASTATIN 20 MG TABLET: 20 | 90 days supply | Qty: 90 | Fill #0

## 2016-03-09 MED FILL — BYSTOLIC 10 MG TABLET: 10 | 90 days supply | Qty: 180 | Fill #1

## 2016-03-17 DIAGNOSIS — E113293 Type 2 diabetes mellitus with mild nonproliferative diabetic retinopathy without macular edema, bilateral: Secondary | ICD-10-CM | POA: Diagnosis not present

## 2016-03-17 DIAGNOSIS — H402221 Chronic angle-closure glaucoma, left eye, mild stage: Secondary | ICD-10-CM | POA: Diagnosis not present

## 2016-03-17 DIAGNOSIS — H402211 Chronic angle-closure glaucoma, right eye, mild stage: Secondary | ICD-10-CM | POA: Diagnosis not present

## 2016-03-17 DIAGNOSIS — E119 Type 2 diabetes mellitus without complications: Secondary | ICD-10-CM | POA: Diagnosis not present

## 2016-03-23 ENCOUNTER — Other Ambulatory Visit: Payer: Self-pay | Admitting: Pharmacist

## 2016-03-23 ENCOUNTER — Encounter: Payer: Self-pay | Admitting: Pharmacist

## 2016-03-23 MED FILL — COLCRYS 0.6 MG TABLET: 0.6 | 30 days supply | Qty: 30 | Fill #2

## 2016-03-23 NOTE — Progress Notes (Signed)
Subjective:  Patient presents today for 3 month diabetes follow-up as part of the employer-sponsored Link to Wellness program.  Current diabetes regimen includes Humulin R 500, Januvia.  Patient also continues on daily ASA and statin.  Most recent MD follow-up was 02/15/16.  Patient has a pending appt for 06/17/16.  No med changes or major health changes at this time.    Assessment:  Diabetes: Most recent A1C was 7.6% which is exceeding goal of less than 7%, but is down 1.0% from previous visit. Weight is stable from last visit with me.   Lifestyle improvements:  Physical Activity-  No significant activity since last visit. Previously had a short bout of regular exercise at gym.  Nutrition- Nutrition continues to be attempted to be controlled for carbohydrates and calories. Still facing some difficulty with low carb vs. Low urate.   Follow up with me in 3 months.    Plan/Goals for Next Visit:  1. Continue improved blood glucose control while on vacation. 2. Restart exercise as able/vacation allows  Next appointment to see me is:  07/20/16 @ 11AM

## 2016-05-30 MED FILL — HUMULIN R 500 UNITS/ML KWIK: 500 | 90 days supply | Qty: 36 | Fill #4

## 2016-05-30 MED FILL — BD PEN NDL MINI 31GX5MM: 31G X 5 MM | 90 days supply | Qty: 300 | Fill #2

## 2016-05-30 MED FILL — HYDROCHLOROTHIAZIDE 25 MG T: 25 | 30 days supply | Qty: 30 | Fill #2

## 2016-05-30 MED FILL — ATORVASTATIN 20 MG TABLET: 20 | 90 days supply | Qty: 90 | Fill #1

## 2016-05-30 MED FILL — ALLOPURINOL 100 MG TABLET: 100 | 90 days supply | Qty: 180 | Fill #3

## 2016-05-30 MED FILL — ONE TOUCH ULTRA TEST STRIPS: 43 days supply | Qty: 300 | Fill #0

## 2016-05-30 MED FILL — JANUVIA 50 MG TABLET: 50 | 90 days supply | Qty: 90 | Fill #2

## 2016-05-30 MED FILL — BYSTOLIC 10 MG TABLET: 10 | 30 days supply | Qty: 60 | Fill #2

## 2016-06-01 DIAGNOSIS — D6489 Other specified anemias: Secondary | ICD-10-CM | POA: Diagnosis not present

## 2016-06-01 DIAGNOSIS — M109 Gout, unspecified: Secondary | ICD-10-CM | POA: Diagnosis not present

## 2016-06-01 DIAGNOSIS — I10 Essential (primary) hypertension: Secondary | ICD-10-CM | POA: Diagnosis not present

## 2016-06-01 DIAGNOSIS — E1129 Type 2 diabetes mellitus with other diabetic kidney complication: Secondary | ICD-10-CM | POA: Diagnosis not present

## 2016-06-01 DIAGNOSIS — M25512 Pain in left shoulder: Secondary | ICD-10-CM | POA: Diagnosis not present

## 2016-06-01 DIAGNOSIS — E048 Other specified nontoxic goiter: Secondary | ICD-10-CM | POA: Diagnosis not present

## 2016-06-01 DIAGNOSIS — H4089 Other specified glaucoma: Secondary | ICD-10-CM | POA: Diagnosis not present

## 2016-06-01 DIAGNOSIS — N08 Glomerular disorders in diseases classified elsewhere: Secondary | ICD-10-CM | POA: Diagnosis not present

## 2016-06-01 DIAGNOSIS — E784 Other hyperlipidemia: Secondary | ICD-10-CM | POA: Diagnosis not present

## 2016-06-01 DIAGNOSIS — Z23 Encounter for immunization: Secondary | ICD-10-CM | POA: Diagnosis not present

## 2016-06-26 MED FILL — BYSTOLIC 10 MG TABLET: 10 | 90 days supply | Qty: 180 | Fill #0

## 2016-06-26 MED FILL — HYDROCHLOROTHIAZIDE 25 MG T: 25 | 90 days supply | Qty: 90 | Fill #0

## 2016-06-29 MED FILL — COLCRYS 0.6 MG TABLET: 0.6 | 30 days supply | Qty: 30 | Fill #0

## 2016-07-13 DIAGNOSIS — N183 Chronic kidney disease, stage 3 (moderate): Secondary | ICD-10-CM | POA: Diagnosis not present

## 2016-07-13 DIAGNOSIS — Z6841 Body Mass Index (BMI) 40.0 and over, adult: Secondary | ICD-10-CM | POA: Diagnosis not present

## 2016-07-13 DIAGNOSIS — M109 Gout, unspecified: Secondary | ICD-10-CM | POA: Diagnosis not present

## 2016-07-13 DIAGNOSIS — E1129 Type 2 diabetes mellitus with other diabetic kidney complication: Secondary | ICD-10-CM | POA: Diagnosis not present

## 2016-07-13 DIAGNOSIS — I129 Hypertensive chronic kidney disease with stage 1 through stage 4 chronic kidney disease, or unspecified chronic kidney disease: Secondary | ICD-10-CM | POA: Diagnosis not present

## 2016-08-21 MED FILL — ATORVASTATIN 20 MG TABLET: 20 | 90 days supply | Qty: 90 | Fill #0

## 2016-08-21 MED FILL — ONE TOUCH ULTRA TEST STRIPS: 43 days supply | Qty: 300 | Fill #1

## 2016-08-21 MED FILL — JANUVIA 50 MG TABLET: 50 | 90 days supply | Qty: 90 | Fill #0

## 2016-08-21 MED FILL — HUMULIN R 500 UNITS/ML KWIK: 500 | 90 days supply | Qty: 36 | Fill #0

## 2016-08-21 MED FILL — COLCRYS 0.6 MG TABLET: 0.6 | 30 days supply | Qty: 30 | Fill #1

## 2016-08-21 MED FILL — BD PEN NDL MINI 31GX5MM: 31G X 5 MM | 90 days supply | Qty: 300 | Fill #0

## 2016-08-22 ENCOUNTER — Other Ambulatory Visit: Payer: Self-pay | Admitting: Family Medicine

## 2016-08-22 DIAGNOSIS — Z1231 Encounter for screening mammogram for malignant neoplasm of breast: Secondary | ICD-10-CM

## 2016-08-22 MED FILL — ALLOPURINOL 100 MG TABLET: 100 | 90 days supply | Qty: 90 | Fill #0

## 2016-08-29 ENCOUNTER — Ambulatory Visit
Admission: RE | Admit: 2016-08-29 | Discharge: 2016-08-29 | Disposition: A | Discharge status: Home / Self Care | Payer: 59 | Source: Ambulatory Visit | Attending: Family Medicine | Admitting: Family Medicine

## 2016-08-29 DIAGNOSIS — Z1231 Encounter for screening mammogram for malignant neoplasm of breast: Secondary | ICD-10-CM

## 2016-09-12 DIAGNOSIS — H1013 Acute atopic conjunctivitis, bilateral: Secondary | ICD-10-CM | POA: Diagnosis not present

## 2016-09-12 DIAGNOSIS — H402231 Chronic angle-closure glaucoma, bilateral, mild stage: Secondary | ICD-10-CM | POA: Diagnosis not present

## 2016-09-12 DIAGNOSIS — H04123 Dry eye syndrome of bilateral lacrimal glands: Secondary | ICD-10-CM | POA: Diagnosis not present

## 2016-09-12 MED FILL — HYDROCHLOROTHIAZIDE 25 MG T: 25 | 90 days supply | Qty: 90 | Fill #1

## 2016-09-12 MED FILL — BYSTOLIC 10 MG TABLET: 10 | 90 days supply | Qty: 180 | Fill #1

## 2016-09-14 MED FILL — COLCRYS 0.6 MG TABLET: 0.6 | 30 days supply | Qty: 30 | Fill #2

## 2016-10-03 DIAGNOSIS — E049 Nontoxic goiter, unspecified: Secondary | ICD-10-CM | POA: Diagnosis not present

## 2016-10-03 DIAGNOSIS — E785 Hyperlipidemia, unspecified: Secondary | ICD-10-CM | POA: Diagnosis not present

## 2016-10-03 DIAGNOSIS — D649 Anemia, unspecified: Secondary | ICD-10-CM | POA: Diagnosis not present

## 2016-10-03 DIAGNOSIS — I10 Essential (primary) hypertension: Secondary | ICD-10-CM | POA: Diagnosis not present

## 2016-10-03 DIAGNOSIS — E1129 Type 2 diabetes mellitus with other diabetic kidney complication: Secondary | ICD-10-CM | POA: Diagnosis not present

## 2016-10-03 DIAGNOSIS — N08 Glomerular disorders in diseases classified elsewhere: Secondary | ICD-10-CM | POA: Diagnosis not present

## 2016-10-03 DIAGNOSIS — E669 Obesity, unspecified: Secondary | ICD-10-CM | POA: Diagnosis not present

## 2016-10-03 DIAGNOSIS — E784 Other hyperlipidemia: Secondary | ICD-10-CM | POA: Diagnosis not present

## 2016-10-03 DIAGNOSIS — M109 Gout, unspecified: Secondary | ICD-10-CM | POA: Diagnosis not present

## 2016-10-03 DIAGNOSIS — Z1389 Encounter for screening for other disorder: Secondary | ICD-10-CM | POA: Diagnosis not present

## 2016-12-04 MED FILL — PENTIPS 31G X 5 MM MISC: 31G X 5 MM | 90 days supply | Qty: 300 | Fill #1

## 2016-12-04 MED FILL — HUMULIN R 500 UNITS/ML KWIK: 500 | 90 days supply | Qty: 36 | Fill #1

## 2016-12-04 MED FILL — HYDROCHLOROTHIAZIDE 25 MG T: 25 | 90 days supply | Qty: 90 | Fill #2

## 2016-12-04 MED FILL — JANUVIA 50 MG TABLET: 50 | 90 days supply | Qty: 90 | Fill #1

## 2016-12-04 MED FILL — BYSTOLIC 10 MG TABLET: 10 | 90 days supply | Qty: 180 | Fill #2

## 2016-12-04 MED FILL — ATORVASTATIN 20 MG TABLET: 20 | 90 days supply | Qty: 90 | Fill #1

## 2016-12-20 DIAGNOSIS — M5410 Radiculopathy, site unspecified: Secondary | ICD-10-CM | POA: Diagnosis not present

## 2016-12-20 MED FILL — predniSONE 10 MG TABS: 10 | 6 days supply | Qty: 21 | Fill #0

## 2016-12-20 MED FILL — OXYCODONE/APAP 5/325 MG TAB: 5-325 | 5 days supply | Qty: 15 | Fill #0

## 2016-12-20 MED FILL — FREESTYLE LANCETS: 43 days supply | Qty: 300 | Fill #0

## 2016-12-20 MED FILL — FREESTYLE LITE TEST STRIP: 43 days supply | Qty: 300 | Fill #0

## 2016-12-20 MED FILL — FREESTYLE LITE METER: 30 days supply | Qty: 1 | Fill #0

## 2016-12-27 ENCOUNTER — Ambulatory Visit (HOSPITAL_BASED_OUTPATIENT_CLINIC_OR_DEPARTMENT_OTHER)
Admission: RE | Admit: 2016-12-27 | Discharge: 2016-12-27 | Disposition: A | Discharge status: Home / Self Care | Payer: 59 | Source: Ambulatory Visit | Attending: Family Medicine | Admitting: Family Medicine

## 2016-12-27 ENCOUNTER — Other Ambulatory Visit (HOSPITAL_BASED_OUTPATIENT_CLINIC_OR_DEPARTMENT_OTHER): Payer: Self-pay | Admitting: Family Medicine

## 2016-12-27 DIAGNOSIS — M5410 Radiculopathy, site unspecified: Secondary | ICD-10-CM

## 2016-12-27 DIAGNOSIS — M50321 Other cervical disc degeneration at C4-C5 level: Secondary | ICD-10-CM | POA: Diagnosis not present

## 2016-12-27 DIAGNOSIS — M546 Pain in thoracic spine: Secondary | ICD-10-CM | POA: Insufficient documentation

## 2016-12-27 DIAGNOSIS — M47892 Other spondylosis, cervical region: Secondary | ICD-10-CM | POA: Insufficient documentation

## 2016-12-27 DIAGNOSIS — M50323 Other cervical disc degeneration at C6-C7 level: Secondary | ICD-10-CM | POA: Diagnosis not present

## 2016-12-27 DIAGNOSIS — M5412 Radiculopathy, cervical region: Secondary | ICD-10-CM | POA: Diagnosis not present

## 2016-12-27 MED FILL — CYCLOBENZAPRINE 10 MG TAB: 10 | 30 days supply | Qty: 30 | Fill #0

## 2016-12-27 MED FILL — traMADol HCL 50 MG TABS: 50 | 5 days supply | Qty: 20 | Fill #0

## 2016-12-27 MED FILL — GABAPENTIN 300 MG CAPSULE: 300 | 30 days supply | Qty: 30 | Fill #0

## 2016-12-28 ENCOUNTER — Other Ambulatory Visit: Payer: Self-pay | Admitting: Family Medicine

## 2016-12-28 DIAGNOSIS — M5410 Radiculopathy, site unspecified: Secondary | ICD-10-CM

## 2017-01-03 DIAGNOSIS — M5410 Radiculopathy, site unspecified: Secondary | ICD-10-CM | POA: Diagnosis not present

## 2017-01-03 DIAGNOSIS — E1122 Type 2 diabetes mellitus with diabetic chronic kidney disease: Secondary | ICD-10-CM | POA: Diagnosis not present

## 2017-01-03 DIAGNOSIS — I10 Essential (primary) hypertension: Secondary | ICD-10-CM | POA: Diagnosis not present

## 2017-01-03 DIAGNOSIS — N182 Chronic kidney disease, stage 2 (mild): Secondary | ICD-10-CM | POA: Diagnosis not present

## 2017-01-03 DIAGNOSIS — E7800 Pure hypercholesterolemia, unspecified: Secondary | ICD-10-CM | POA: Diagnosis not present

## 2017-01-03 DIAGNOSIS — N183 Chronic kidney disease, stage 3 (moderate): Secondary | ICD-10-CM | POA: Diagnosis not present

## 2017-01-03 DIAGNOSIS — Z794 Long term (current) use of insulin: Secondary | ICD-10-CM | POA: Diagnosis not present

## 2017-01-03 MED FILL — traMADol HCL 50 MG TABS: 50 | 10 days supply | Qty: 20 | Fill #0

## 2017-01-10 ENCOUNTER — Ambulatory Visit
Admission: RE | Admit: 2017-01-10 | Discharge: 2017-01-10 | Disposition: A | Discharge status: Home / Self Care | Payer: 59 | Source: Ambulatory Visit | Attending: Family Medicine | Admitting: Family Medicine

## 2017-01-10 DIAGNOSIS — M5410 Radiculopathy, site unspecified: Secondary | ICD-10-CM

## 2017-01-10 DIAGNOSIS — M48061 Spinal stenosis, lumbar region without neurogenic claudication: Secondary | ICD-10-CM | POA: Diagnosis not present

## 2017-01-18 ENCOUNTER — Telehealth: Payer: Self-pay

## 2017-01-23 MED FILL — GABAPENTIN 300 MG CAPSULE: 300 | 30 days supply | Qty: 30 | Fill #1

## 2017-01-23 MED FILL — CYCLOBENZAPRINE 10 MG TAB: 10 | 30 days supply | Qty: 30 | Fill #0

## 2017-02-01 DIAGNOSIS — I10 Essential (primary) hypertension: Secondary | ICD-10-CM | POA: Diagnosis not present

## 2017-02-01 DIAGNOSIS — M50223 Other cervical disc displacement at C6-C7 level: Secondary | ICD-10-CM | POA: Diagnosis not present

## 2017-02-01 DIAGNOSIS — Z6841 Body Mass Index (BMI) 40.0 and over, adult: Secondary | ICD-10-CM | POA: Diagnosis not present

## 2017-02-13 DIAGNOSIS — E048 Other specified nontoxic goiter: Secondary | ICD-10-CM | POA: Diagnosis not present

## 2017-02-13 DIAGNOSIS — M5412 Radiculopathy, cervical region: Secondary | ICD-10-CM | POA: Diagnosis not present

## 2017-02-13 DIAGNOSIS — R740 Nonspecific elevation of levels of transaminase and lactic acid dehydrogenase [LDH]: Secondary | ICD-10-CM | POA: Diagnosis not present

## 2017-02-13 DIAGNOSIS — E784 Other hyperlipidemia: Secondary | ICD-10-CM | POA: Diagnosis not present

## 2017-02-13 DIAGNOSIS — H409 Unspecified glaucoma: Secondary | ICD-10-CM | POA: Diagnosis not present

## 2017-02-13 DIAGNOSIS — D649 Anemia, unspecified: Secondary | ICD-10-CM | POA: Diagnosis not present

## 2017-02-13 DIAGNOSIS — M109 Gout, unspecified: Secondary | ICD-10-CM | POA: Diagnosis not present

## 2017-02-13 DIAGNOSIS — N08 Glomerular disorders in diseases classified elsewhere: Secondary | ICD-10-CM | POA: Diagnosis not present

## 2017-02-13 DIAGNOSIS — E1129 Type 2 diabetes mellitus with other diabetic kidney complication: Secondary | ICD-10-CM | POA: Diagnosis not present

## 2017-02-13 DIAGNOSIS — Z1389 Encounter for screening for other disorder: Secondary | ICD-10-CM | POA: Diagnosis not present

## 2017-02-13 DIAGNOSIS — I10 Essential (primary) hypertension: Secondary | ICD-10-CM | POA: Diagnosis not present

## 2017-02-14 MED FILL — ALLOPURINOL 100 MG TABLET: 100 | 90 days supply | Qty: 90 | Fill #0

## 2017-02-23 MED FILL — BYSTOLIC 10 MG TABLET: 10 | 90 days supply | Qty: 180 | Fill #3

## 2017-02-23 MED FILL — JANUVIA 50 MG TABLET: 50 | 90 days supply | Qty: 90 | Fill #0

## 2017-02-23 MED FILL — ATORVASTATIN 20 MG TABLET: 20 | 90 days supply | Qty: 90 | Fill #0

## 2017-02-23 MED FILL — HUMULIN R 500 UNITS/ML KWIK: 500 | 90 days supply | Qty: 36 | Fill #0

## 2017-02-23 MED FILL — HYDROCHLOROTHIAZIDE 25 MG T: 25 | 90 days supply | Qty: 90 | Fill #3

## 2017-02-23 MED FILL — CYCLOBENZAPRINE 10 MG TAB: 10 | 30 days supply | Qty: 30 | Fill #0

## 2017-02-23 MED FILL — GABAPENTIN 300 MG CAPSULE: 300 | 30 days supply | Qty: 30 | Fill #2

## 2017-02-26 MED FILL — PENTIPS 31G X 5 MM MISC: 31G X 5 MM | 90 days supply | Qty: 300 | Fill #2

## 2017-03-02 MED FILL — traMADol HCL 50 MG TABS: 50 | 25 days supply | Qty: 50 | Fill #0

## 2017-03-07 MED FILL — FREESTYLE LITE TEST STRIP: 43 days supply | Qty: 300 | Fill #1

## 2017-03-20 DIAGNOSIS — E113293 Type 2 diabetes mellitus with mild nonproliferative diabetic retinopathy without macular edema, bilateral: Secondary | ICD-10-CM | POA: Diagnosis not present

## 2017-03-20 DIAGNOSIS — H402231 Chronic angle-closure glaucoma, bilateral, mild stage: Secondary | ICD-10-CM | POA: Diagnosis not present

## 2017-03-20 DIAGNOSIS — E119 Type 2 diabetes mellitus without complications: Secondary | ICD-10-CM | POA: Diagnosis not present

## 2017-03-20 DIAGNOSIS — H2513 Age-related nuclear cataract, bilateral: Secondary | ICD-10-CM | POA: Diagnosis not present

## 2017-03-22 MED FILL — GABAPENTIN 300 MG CAPSULE: 300 | 30 days supply | Qty: 30 | Fill #0

## 2017-03-22 MED FILL — CYCLOBENZAPRINE 10 MG TABLE: 10 | 30 days supply | Qty: 30 | Fill #0

## 2017-05-02 ENCOUNTER — Encounter: Payer: Self-pay | Admitting: Pharmacist

## 2017-05-02 ENCOUNTER — Other Ambulatory Visit: Payer: Self-pay | Admitting: Pharmacist

## 2017-05-02 VITALS — BP 122/77 | HR 78 | Wt 271.4 lb

## 2017-05-02 DIAGNOSIS — Z794 Long term (current) use of insulin: Principal | ICD-10-CM

## 2017-05-02 DIAGNOSIS — E118 Type 2 diabetes mellitus with unspecified complications: Secondary | ICD-10-CM

## 2017-05-02 NOTE — Patient Outreach (Signed)
[facility] [facility]) Care Management  05/02/2017  Kristine Gray [birth year] [id number]  Subjective: Patient presents today for 3 month diabetes follow-up as part of the employer-sponsored Link to Wellness program.  Current diabetes regimen includes Humulin Concentrated U 500 insulin 90-95 units with breakfast, 50-55 units with lunch, 30-35 units with supper, and januvia 50 mg daily.  Patient also continues on daily aspirin, and atorvastatin.  Patient endorses compliance to medications. Most recent MD follow-up was May, 2018 with her endocrinologist. Other major health changes include newly diagnosed herniated disc and arthritis in neck. Meds added included cyclobenzaprine, tramadol, and gabapentin.   Reports she has been doing well recently. Has lost ~ 15 lbs since September 2017 and is encouraged by this. Not experiencing hypoglycemia however feels low around 90 mg/dL.  Is trying to be adherence to low urate, low oxalate, low carb diet.   Patient reported dietary habits: Eats 3 meals/day; 24 hr recall below: Breakfast: honey bunches of oats (3/4 cup), milk Lunch: "depends on where we are" - hamburger and fries Dinner: 2 pieces of toast with nutella Snacks: none yesterday, nuts (correct serving size) Drinks: water  Patient reported exercise habits: walks 3x/week at grocery store ~60 mins (stop/start, not consistently walking for 60 mins; estimated to be walking for ~30 mins per trip cumulatively)  Patient denies hypoglycemic events.  Patient reports nocturia 2x. Stable.   Patient denies pain/burning upon urination.  Patient reports neuropathy. Complicated by herniated disc in neck. Patient denies visual changes. Patient reports self foot exams. No issues.   Patient reported self monitored blood glucose frequency once daily Home fasting CBG: 120s-180s, excursions to 200s   Objective:  Lab Results  Component Value Date   HGBA1C 8.3 (H) 03/21/2012   Vitals:   05/02/17  0923  BP: 122/77  Pulse: 78    Lipid Panel  No results found for: CHOL, TRIG, HDL, CHOLHDL, VLDL, LDLCALC, LDLDIRECT  10 year ASCVD risk: unable to calculate without lipid panel  Last eye exam: April 2018 Last dental exam:  August 2018  Assessment: Diabetes: uncontrolled, limited by sedentary lifestyle - A1C 8.4% per patient report which is above goal of less than 7%.  - Weight is down from last visit with [facility].    Physical Activity- improved, limited by fear of walking on trails nearby house, lack of energy. Currently getting ~90 mins/week.  - Below goal of at least 150 minutes of moderate intensity exercise weekly  Nutrition- adequate, limited by multiple dietary restrictions - Maintaining adherence to diabetic diet 75% of the time - Drinking plenty of water  Plan/Goals for Next Visit: - Counseled on s/sx hypoglycemia and appropriate treatment - Diet goal: Continue good work; avoid sweets and carbohydrates, use Plate Method - Exercise goal: increase by walking up stairs in home 4x/day for exercise - Follow-up: living will (asking patient to bring in to scan and put in chart at next visit) - Detailed goals and action plans are listed below  Next appointment to see Charlene Brooke, PharmD, PGY1 Pharmacy Resident on 08/06/2017    Deirdre Pippins, PharmD [facility] PGY2 Pharmacy Resident [phone]  [facility] CM Care Plan Problem One     Most Recent Value  Care Plan Problem One  Overwieght  Role Documenting the Problem One  Clinical Pharmacist  Care Plan for Problem One  Active  [facility] Long Term Goal   Over the next 90 days patient will lose 3 lbs by continuing a low carbohydrate diet and using the  Plate Method to plan meals as evidenced by patient report and scale reading  [facility] Long Term Goal Start Date  05/02/17  Interventions for Problem One Long Term Goal  Counseled on Plate Method, provided encouragement, counseled on importance of exercise    [facility] CM Care Plan  Problem Two     Most Recent Value  Care Plan Problem Two  Sedentary lifestyle  Role Documenting the Problem Two  Clinical Pharmacist  Care Plan for Problem Two  Active  Interventions for Problem Two Long Term Goal   Counseled on importance of exercise and recommended 150 mins/week for DM patients  [facility] Long Term Goal  Over the next 90 days, patient will increase daily activity by moving every hour and walking up stairs in her home at least 4x/day for exercise,  in addition, she will continue walking up and down the isles in the grocery store for exercise 3x/week, as evidenced by patient report  [place] Term Goal Start Date  05/02/17

## 2017-05-03 MED FILL — CYCLOBENZAPRINE 10 MG TABLE: 10 | 30 days supply | Qty: 30 | Fill #0

## 2017-05-03 MED FILL — ALLOPURINOL 100 MG TABLET: 100 | 90 days supply | Qty: 90 | Fill #1

## 2017-05-03 MED FILL — GABAPENTIN 300 MG CAPSULE: 300 | 30 days supply | Qty: 30 | Fill #1

## 2017-05-03 MED FILL — FREESTYLE LITE TEST STRIP: 43 days supply | Qty: 300 | Fill #2

## 2017-05-07 MED FILL — ATORVASTATIN 20 MG TABLET: 20 | 90 days supply | Qty: 90 | Fill #1

## 2017-05-07 MED FILL — HUMULIN R 500 UNITS/ML KWIK: 500 | 90 days supply | Qty: 36 | Fill #1

## 2017-05-07 MED FILL — JANUVIA 50 MG TABLET: 50 | 90 days supply | Qty: 90 | Fill #1

## 2017-05-07 MED FILL — BYSTOLIC 10 MG TABLET: 10 | 90 days supply | Qty: 180 | Fill #4

## 2017-05-14 MED FILL — PENTIPS 31G X 5 MM MISC: 31G X 5 MM | 90 days supply | Qty: 300 | Fill #0

## 2017-05-14 MED FILL — HYDROCHLOROTHIAZIDE 25 MG T: 25 | 90 days supply | Qty: 90 | Fill #0

## 2017-05-31 MED FILL — GABAPENTIN 300 MG CAPSULE: 300 | 30 days supply | Qty: 30 | Fill #2

## 2017-05-31 MED FILL — CYCLOBENZAPRINE 10 MG TAB: 10 | 30 days supply | Qty: 30 | Fill #0

## 2017-06-20 DIAGNOSIS — M109 Gout, unspecified: Secondary | ICD-10-CM | POA: Diagnosis not present

## 2017-06-20 DIAGNOSIS — E784 Other hyperlipidemia: Secondary | ICD-10-CM | POA: Diagnosis not present

## 2017-06-20 DIAGNOSIS — I10 Essential (primary) hypertension: Secondary | ICD-10-CM | POA: Diagnosis not present

## 2017-06-20 DIAGNOSIS — N08 Glomerular disorders in diseases classified elsewhere: Secondary | ICD-10-CM | POA: Diagnosis not present

## 2017-06-20 DIAGNOSIS — M5412 Radiculopathy, cervical region: Secondary | ICD-10-CM | POA: Diagnosis not present

## 2017-06-20 DIAGNOSIS — N200 Calculus of kidney: Secondary | ICD-10-CM | POA: Diagnosis not present

## 2017-06-20 DIAGNOSIS — Z23 Encounter for immunization: Secondary | ICD-10-CM | POA: Diagnosis not present

## 2017-06-20 DIAGNOSIS — E1129 Type 2 diabetes mellitus with other diabetic kidney complication: Secondary | ICD-10-CM | POA: Diagnosis not present

## 2017-06-20 DIAGNOSIS — D649 Anemia, unspecified: Secondary | ICD-10-CM | POA: Diagnosis not present

## 2017-06-20 DIAGNOSIS — E669 Obesity, unspecified: Secondary | ICD-10-CM | POA: Diagnosis not present

## 2017-06-29 MED FILL — GABAPENTIN 300 MG CAPSULE: 300 | 90 days supply | Qty: 90 | Fill #0

## 2017-06-29 MED FILL — CYCLOBENZAPRINE 10 MG TAB: 10 | 90 days supply | Qty: 90 | Fill #0

## 2017-08-06 ENCOUNTER — Other Ambulatory Visit: Payer: Self-pay | Admitting: Pharmacist

## 2017-08-06 MED FILL — HYDROCHLOROTHIAZIDE 25 MG T: 25 | 90 days supply | Qty: 90 | Fill #1

## 2017-08-06 MED FILL — HUMULIN R 500 UNITS/ML KWIK: 500 | 90 days supply | Qty: 36 | Fill #0

## 2017-08-06 MED FILL — JANUVIA 50 MG TABLET: 50 | 90 days supply | Qty: 90 | Fill #0

## 2017-08-06 MED FILL — PENTIPS 31G X 5 MM MISC: 31G X 5 MM | 90 days supply | Qty: 300 | Fill #1

## 2017-08-06 MED FILL — FREESTYLE LITE TEST STRIP: 43 days supply | Qty: 300 | Fill #3

## 2017-08-06 MED FILL — ATORVASTATIN 20 MG TABLET: 20 | 90 days supply | Qty: 90 | Fill #0

## 2017-08-06 MED FILL — BYSTOLIC 10 MG TABLET: 10 | 90 days supply | Qty: 180 | Fill #0

## 2017-08-06 NOTE — Patient Outreach (Signed)
Subjective: Patient presents today for 3 month diabetes follow-up as part of the employer-sponsored Link to Wellness program.  Current diabetes regimen includes humulin U500 (concentrated) 90 units with breakfast, 50 units 8 hours later, 50 units around bedtime and januvia 50 mg daily.  Patient also continues on daily aspirin and atorvastatin.  Patient endorses good compliance to medications. Most recent MD follow-up was September 2018.  Patient has a pending appt for January 2019.  No med changes or major health changes at this time.   Patient informs me her husband is retiring effective Sep 25 2017 and she therefore will no longer qualify for Link to Wellness.   Patient reported dietary habits: Eats 2-3 meals/day Breakfast: poppy seed muffin and milk Dinner: cream of chicken soup + crackers Snack: poppy seed muffin and milk  Patient reported exercise habits: walking around grocery store, climbing up and down stairs  Patient reports hypoglycemic events. ~once a week. Feels symptoms at 90 Patient denies nocturia .  2 Patient reported self monitored blood glucose frequency 3-4 times daily Home fasting CBG: 80-130  2 hour post-prandial/random CBG: 180s-200s  Objective:  Lab Results  Component Value Date   HGBA1C 8.3 (H) 03/21/2012   There were no vitals filed for this visit.  Lipid Panel  No results found for: CHOL, TRIG, HDL, CHOLHDL, VLDL, LDLCALC, LDLDIRECT  Last eye exam: April 2018 Last dental exam: Oct 2018  Assessment: Diabetes: uncontrolled, limited by sedentary lifestyle, dietary indiscretions, altered sleep schedule - A1C 8.1% which is above goal of less than 7%.  - Weight is unchanged from last visit with me.    Physical Activity- below goal of at least 150 minutes of moderate intensity exercise weekly, limited by sedentary lifestyle - Currently achieving ~90 minutes of exercise/week   Plan/Goals for Next Visit: - Counseled on s/sx hypoglycemia and appropriate  treatment d she verbalized understanding - Diet goal: continue healthy diet choices - Exercise goal: increase stairs/walking - Follow-up: ending care with [facility] due to husband retiring from [facility] - Detailed goals and action plans are listed below    Charlene Brooke, PharmD PGY1 Pharmacy Resident Email: [email or web address]     [facility] CM Care Plan Problem One     Most Recent Value  Care Plan Problem One  Overwieght  Role Documenting the Problem One  Clinical Pharmacist  Care Plan for Problem One  Not Active  [facility] Long Term Goal   Over the next 90 days patient will lose 3 lbs by continuing a low carbohydrate diet and using the Plate Method to plan meals as evidenced by patient report and scale reading  [place] Term Goal Start Date  05/02/17  [facility] Long Term Goal Met Date  08/06/17  Interventions for Problem One Long Term Goal  Encouraged good dietary choices and increasing exercise    [facility] CM Care Plan Problem Two     Most Recent Value  Care Plan Problem Two  Sedentary lifestyle  Role Documenting the Problem Two  Clinical Pharmacist  Care Plan for Problem Two  Not Active  Interventions for Problem Two Long Term Goal   Counseled on importance of exercise and recommended 150 mins/week for DM patients  [facility] Long Term Goal  Over the next 90 days, patient will increase daily activity by moving every hour and walking up stairs in her home at least 4x/day for exercise,  in addition, she will continue walking up and down the isles in the grocery store for exercise 3x/week, as evidenced by  patient report  [facility] Long Term Goal Start Date  05/02/17  [facility] Long Term Goal Met Date  08/06/17       .

## 2017-08-22 ENCOUNTER — Other Ambulatory Visit: Payer: Self-pay | Admitting: Endocrinology

## 2017-08-22 DIAGNOSIS — Z1239 Encounter for other screening for malignant neoplasm of breast: Secondary | ICD-10-CM

## 2017-08-27 MED FILL — ULORIC 80 MG TABLET: 80 | 60 days supply | Qty: 30 | Fill #0

## 2017-09-05 ENCOUNTER — Ambulatory Visit: Payer: 59

## 2017-09-06 MED FILL — traMADol HCL 50 MG TABS: 50 | 15 days supply | Qty: 60 | Fill #0

## 2017-09-07 ENCOUNTER — Ambulatory Visit: Payer: 59

## 2017-09-10 MED FILL — CYCLOBENZAPRINE 10 MG TAB: 10 | 90 days supply | Qty: 90 | Fill #1

## 2017-09-10 MED FILL — GABAPENTIN 300 MG CAPSULE: 300 | 90 days supply | Qty: 90 | Fill #1

## 2017-09-13 DIAGNOSIS — H402231 Chronic angle-closure glaucoma, bilateral, mild stage: Secondary | ICD-10-CM | POA: Diagnosis not present

## 2017-09-13 DIAGNOSIS — H04123 Dry eye syndrome of bilateral lacrimal glands: Secondary | ICD-10-CM | POA: Diagnosis not present

## 2017-10-17 DIAGNOSIS — E1129 Type 2 diabetes mellitus with other diabetic kidney complication: Secondary | ICD-10-CM | POA: Diagnosis not present

## 2017-10-17 DIAGNOSIS — Z1389 Encounter for screening for other disorder: Secondary | ICD-10-CM | POA: Diagnosis not present

## 2017-10-17 DIAGNOSIS — E7849 Other hyperlipidemia: Secondary | ICD-10-CM | POA: Diagnosis not present

## 2017-10-17 DIAGNOSIS — N08 Glomerular disorders in diseases classified elsewhere: Secondary | ICD-10-CM | POA: Diagnosis not present

## 2017-10-17 DIAGNOSIS — Z6841 Body Mass Index (BMI) 40.0 and over, adult: Secondary | ICD-10-CM | POA: Diagnosis not present

## 2017-10-17 DIAGNOSIS — R740 Nonspecific elevation of levels of transaminase and lactic acid dehydrogenase [LDH]: Secondary | ICD-10-CM | POA: Diagnosis not present

## 2017-10-17 DIAGNOSIS — I10 Essential (primary) hypertension: Secondary | ICD-10-CM | POA: Diagnosis not present

## 2017-10-17 DIAGNOSIS — D6489 Other specified anemias: Secondary | ICD-10-CM | POA: Diagnosis not present

## 2017-10-17 DIAGNOSIS — E048 Other specified nontoxic goiter: Secondary | ICD-10-CM | POA: Diagnosis not present

## 2017-10-17 DIAGNOSIS — M1000 Idiopathic gout, unspecified site: Secondary | ICD-10-CM | POA: Diagnosis not present

## 2017-10-25 MED FILL — ULORIC 40 MG TABLET: 40 | 90 days supply | Qty: 90 | Fill #0

## 2017-11-19 MED FILL — HYDROCHLOROTHIAZIDE 25 MG T: 25 | 90 days supply | Qty: 90 | Fill #2

## 2017-11-19 MED FILL — BYSTOLIC 10 MG TABLET: 10 | 90 days supply | Qty: 180 | Fill #1

## 2017-11-19 MED FILL — JANUVIA 50 MG TABLET: 50 | 90 days supply | Qty: 90 | Fill #1

## 2017-11-19 MED FILL — FREESTYLE LITE TEST STRIP: 43 days supply | Qty: 300 | Fill #0

## 2017-11-27 ENCOUNTER — Ambulatory Visit
Admission: RE | Admit: 2017-11-27 | Discharge: 2017-11-27 | Disposition: A | Discharge status: Home / Self Care | Payer: PPO | Source: Ambulatory Visit | Attending: Endocrinology | Admitting: Endocrinology

## 2017-11-27 DIAGNOSIS — Z1231 Encounter for screening mammogram for malignant neoplasm of breast: Secondary | ICD-10-CM | POA: Diagnosis not present

## 2017-11-27 DIAGNOSIS — Z1239 Encounter for other screening for malignant neoplasm of breast: Secondary | ICD-10-CM

## 2018-01-07 MED FILL — ATORVASTATIN 20 MG TABLET: 20 | 90 days supply | Qty: 90 | Fill #1

## 2018-01-07 MED FILL — PENTIPS 31G X 5 MM MISC: 31G X 5 MM | 90 days supply | Qty: 300 | Fill #2

## 2018-01-07 MED FILL — CYCLOBENZAPRINE HCL 10 MG T: 10 | 90 days supply | Qty: 90 | Fill #2

## 2018-01-07 MED FILL — HUMULIN R 500 UNITS/ML KWIK: 500 | 30 days supply | Qty: 12 | Fill #1

## 2018-01-07 MED FILL — GABAPENTIN 300 MG CAPSULE: 300 | 90 days supply | Qty: 90 | Fill #2

## 2018-01-17 MED FILL — ULORIC 40 MG TABLET: 40 | 30 days supply | Qty: 30 | Fill #1

## 2018-02-19 DIAGNOSIS — E048 Other specified nontoxic goiter: Secondary | ICD-10-CM | POA: Diagnosis not present

## 2018-02-19 DIAGNOSIS — M109 Gout, unspecified: Secondary | ICD-10-CM | POA: Diagnosis not present

## 2018-02-19 DIAGNOSIS — Z1389 Encounter for screening for other disorder: Secondary | ICD-10-CM | POA: Diagnosis not present

## 2018-02-19 DIAGNOSIS — H409 Unspecified glaucoma: Secondary | ICD-10-CM | POA: Diagnosis not present

## 2018-02-19 DIAGNOSIS — N183 Chronic kidney disease, stage 3 (moderate): Secondary | ICD-10-CM | POA: Diagnosis not present

## 2018-02-19 DIAGNOSIS — I10 Essential (primary) hypertension: Secondary | ICD-10-CM | POA: Diagnosis not present

## 2018-02-19 DIAGNOSIS — E7849 Other hyperlipidemia: Secondary | ICD-10-CM | POA: Diagnosis not present

## 2018-02-19 DIAGNOSIS — R740 Nonspecific elevation of levels of transaminase and lactic acid dehydrogenase [LDH]: Secondary | ICD-10-CM | POA: Diagnosis not present

## 2018-02-19 DIAGNOSIS — N200 Calculus of kidney: Secondary | ICD-10-CM | POA: Diagnosis not present

## 2018-02-19 DIAGNOSIS — E668 Other obesity: Secondary | ICD-10-CM | POA: Diagnosis not present

## 2018-02-19 DIAGNOSIS — E119 Type 2 diabetes mellitus without complications: Secondary | ICD-10-CM | POA: Diagnosis not present

## 2018-02-19 DIAGNOSIS — Z6841 Body Mass Index (BMI) 40.0 and over, adult: Secondary | ICD-10-CM | POA: Diagnosis not present

## 2018-02-28 MED FILL — JANUVIA 50 MG TABLET: 50 | 90 days supply | Qty: 90 | Fill #0

## 2018-02-28 MED FILL — HYDROCHLOROTHIAZIDE 25 MG T: 25 | 90 days supply | Qty: 90 | Fill #0

## 2018-02-28 MED FILL — BYSTOLIC 10 MG TABLET: 10 | 90 days supply | Qty: 180 | Fill #2

## 2018-02-28 MED FILL — FREESTYLE LITE TEST STRIP: 43 days supply | Qty: 300 | Fill #1

## 2018-03-01 MED FILL — HUMULIN R 500 UNITS/ML KWIK: 500 | 30 days supply | Qty: 12 | Fill #2

## 2018-03-01 MED FILL — ULORIC 40 MG TABLET: 40 | 30 days supply | Qty: 30 | Fill #2

## 2018-03-21 DIAGNOSIS — H35033 Hypertensive retinopathy, bilateral: Secondary | ICD-10-CM | POA: Diagnosis not present

## 2018-03-21 DIAGNOSIS — E113293 Type 2 diabetes mellitus with mild nonproliferative diabetic retinopathy without macular edema, bilateral: Secondary | ICD-10-CM | POA: Diagnosis not present

## 2018-03-21 DIAGNOSIS — H402231 Chronic angle-closure glaucoma, bilateral, mild stage: Secondary | ICD-10-CM | POA: Diagnosis not present

## 2018-03-21 DIAGNOSIS — H2513 Age-related nuclear cataract, bilateral: Secondary | ICD-10-CM | POA: Diagnosis not present

## 2018-04-01 MED FILL — ATORVASTATIN 20 MG TABLET: 20 | 90 days supply | Qty: 90 | Fill #0

## 2018-04-01 MED FILL — HUMULIN R 500 UNITS/ML KWIK: 500 | 30 days supply | Qty: 12 | Fill #3

## 2018-04-01 MED FILL — PENTIPS 31G X 5 MM MISC: 31G X 5 MM | 90 days supply | Qty: 300 | Fill #0

## 2018-04-01 MED FILL — ULORIC 40 MG TABLET: 40 | 30 days supply | Qty: 30 | Fill #3

## 2018-04-01 MED FILL — CYCLOBENZAPRINE HCL 10 MG T: 10 | 90 days supply | Qty: 90 | Fill #3

## 2018-04-01 MED FILL — GABAPENTIN 300 MG CAPSULE: 300 | 90 days supply | Qty: 90 | Fill #3

## 2018-05-16 MED FILL — HUMULIN R 500 UNITS/ML KWIK: 500 | 75 days supply | Qty: 30 | Fill #0

## 2018-05-30 DIAGNOSIS — Z23 Encounter for immunization: Secondary | ICD-10-CM | POA: Diagnosis not present

## 2018-06-13 MED FILL — BYSTOLIC 10 MG TABLET: 10 | 90 days supply | Qty: 180 | Fill #3

## 2018-06-13 MED FILL — FREESTYLE LITE TEST STRIP: 43 days supply | Qty: 300 | Fill #2

## 2018-06-13 MED FILL — CYCLOBENZAPRINE HCL 10 MG T: 10 | 90 days supply | Qty: 90 | Fill #0

## 2018-06-13 MED FILL — ATORVASTATIN CALCIUM 20 MG: 20 | 90 days supply | Qty: 90 | Fill #1

## 2018-06-13 MED FILL — GABAPENTIN 300 MG CAPSULE: 300 | 90 days supply | Qty: 90 | Fill #0

## 2018-06-13 MED FILL — HYDROCHLOROTHIAZIDE 25 MG T: 25 | 90 days supply | Qty: 90 | Fill #1

## 2018-06-13 MED FILL — JANUVIA 50 MG TABLET: 50 | 90 days supply | Qty: 90 | Fill #1

## 2018-06-24 DIAGNOSIS — I10 Essential (primary) hypertension: Secondary | ICD-10-CM | POA: Diagnosis not present

## 2018-06-24 DIAGNOSIS — N183 Chronic kidney disease, stage 3 (moderate): Secondary | ICD-10-CM | POA: Diagnosis not present

## 2018-06-24 DIAGNOSIS — Z23 Encounter for immunization: Secondary | ICD-10-CM | POA: Diagnosis not present

## 2018-06-24 DIAGNOSIS — E1169 Type 2 diabetes mellitus with other specified complication: Secondary | ICD-10-CM | POA: Diagnosis not present

## 2018-06-24 DIAGNOSIS — E1122 Type 2 diabetes mellitus with diabetic chronic kidney disease: Secondary | ICD-10-CM | POA: Diagnosis not present

## 2018-06-24 DIAGNOSIS — E785 Hyperlipidemia, unspecified: Secondary | ICD-10-CM | POA: Diagnosis not present

## 2018-06-24 DIAGNOSIS — E1159 Type 2 diabetes mellitus with other circulatory complications: Secondary | ICD-10-CM | POA: Diagnosis not present

## 2018-06-24 DIAGNOSIS — M5030 Other cervical disc degeneration, unspecified cervical region: Secondary | ICD-10-CM | POA: Diagnosis not present

## 2018-07-10 DIAGNOSIS — Z6841 Body Mass Index (BMI) 40.0 and over, adult: Secondary | ICD-10-CM | POA: Diagnosis not present

## 2018-07-10 DIAGNOSIS — D6489 Other specified anemias: Secondary | ICD-10-CM | POA: Diagnosis not present

## 2018-07-10 DIAGNOSIS — M109 Gout, unspecified: Secondary | ICD-10-CM | POA: Diagnosis not present

## 2018-07-10 DIAGNOSIS — E1129 Type 2 diabetes mellitus with other diabetic kidney complication: Secondary | ICD-10-CM | POA: Diagnosis not present

## 2018-07-10 DIAGNOSIS — H4089 Other specified glaucoma: Secondary | ICD-10-CM | POA: Diagnosis not present

## 2018-07-10 DIAGNOSIS — E048 Other specified nontoxic goiter: Secondary | ICD-10-CM | POA: Diagnosis not present

## 2018-07-10 DIAGNOSIS — E7849 Other hyperlipidemia: Secondary | ICD-10-CM | POA: Diagnosis not present

## 2018-07-10 DIAGNOSIS — N183 Chronic kidney disease, stage 3 (moderate): Secondary | ICD-10-CM | POA: Diagnosis not present

## 2018-07-10 DIAGNOSIS — I10 Essential (primary) hypertension: Secondary | ICD-10-CM | POA: Diagnosis not present

## 2018-07-10 DIAGNOSIS — R740 Nonspecific elevation of levels of transaminase and lactic acid dehydrogenase [LDH]: Secondary | ICD-10-CM | POA: Diagnosis not present

## 2018-07-10 DIAGNOSIS — N200 Calculus of kidney: Secondary | ICD-10-CM | POA: Diagnosis not present

## 2018-07-11 MED FILL — COLCHICINE 0.6 MG TABS: 0.6 | 90 days supply | Qty: 90 | Fill #0

## 2018-07-12 MED FILL — HUMULIN R 500 UNITS/ML KWIK: 500 | 90 days supply | Qty: 36 | Fill #0

## 2018-07-25 DIAGNOSIS — E1129 Type 2 diabetes mellitus with other diabetic kidney complication: Secondary | ICD-10-CM | POA: Diagnosis not present

## 2018-08-07 MED FILL — PENTIPS 31G X 5 MM MISC: 31G X 5 MM | 90 days supply | Qty: 300 | Fill #1

## 2018-08-20 DIAGNOSIS — Z6841 Body Mass Index (BMI) 40.0 and over, adult: Secondary | ICD-10-CM | POA: Diagnosis not present

## 2018-08-20 DIAGNOSIS — E1129 Type 2 diabetes mellitus with other diabetic kidney complication: Secondary | ICD-10-CM | POA: Diagnosis not present

## 2018-08-20 DIAGNOSIS — N183 Chronic kidney disease, stage 3 (moderate): Secondary | ICD-10-CM | POA: Diagnosis not present

## 2018-08-20 DIAGNOSIS — I10 Essential (primary) hypertension: Secondary | ICD-10-CM | POA: Diagnosis not present

## 2018-08-20 DIAGNOSIS — Z794 Long term (current) use of insulin: Secondary | ICD-10-CM | POA: Diagnosis not present

## 2018-09-05 DIAGNOSIS — H1013 Acute atopic conjunctivitis, bilateral: Secondary | ICD-10-CM | POA: Diagnosis not present

## 2018-09-05 DIAGNOSIS — H04123 Dry eye syndrome of bilateral lacrimal glands: Secondary | ICD-10-CM | POA: Diagnosis not present

## 2018-09-05 DIAGNOSIS — H402231 Chronic angle-closure glaucoma, bilateral, mild stage: Secondary | ICD-10-CM | POA: Diagnosis not present

## 2018-09-12 MED FILL — FREESTYLE LITE TEST STRIP: 43 days supply | Qty: 300 | Fill #3

## 2018-09-12 MED FILL — HYDROCHLOROTHIAZIDE 25 MG T: 25 | 90 days supply | Qty: 90 | Fill #0

## 2018-09-12 MED FILL — BYSTOLIC 10 MG TABLET: 10 | 90 days supply | Qty: 180 | Fill #0

## 2018-09-12 MED FILL — CYCLOBENZAPRINE HCL 10 MG T: 10 | 90 days supply | Qty: 90 | Fill #1

## 2018-09-12 MED FILL — GABAPENTIN 300 MG CAPSULE: 300 | 90 days supply | Qty: 90 | Fill #1

## 2018-09-12 MED FILL — ATORVASTATIN CALCIUM 20 MG: 20 | 90 days supply | Qty: 90 | Fill #0

## 2018-09-20 MED FILL — traMADol HCL 50 MG TABS: 50 | 7 days supply | Qty: 14 | Fill #0

## 2018-09-23 MED FILL — HUMULIN R 500 UNITS/ML KWIK: 500 | 90 days supply | Qty: 36 | Fill #1

## 2018-09-23 MED FILL — COLCHICINE 0.6 MG TABS: 0.6 | 90 days supply | Qty: 90 | Fill #1

## 2018-11-20 DIAGNOSIS — I10 Essential (primary) hypertension: Secondary | ICD-10-CM | POA: Diagnosis not present

## 2018-11-20 DIAGNOSIS — E7849 Other hyperlipidemia: Secondary | ICD-10-CM | POA: Diagnosis not present

## 2018-11-20 DIAGNOSIS — R740 Nonspecific elevation of levels of transaminase and lactic acid dehydrogenase [LDH]: Secondary | ICD-10-CM | POA: Diagnosis not present

## 2018-11-20 DIAGNOSIS — Z794 Long term (current) use of insulin: Secondary | ICD-10-CM | POA: Diagnosis not present

## 2018-11-20 DIAGNOSIS — N183 Chronic kidney disease, stage 3 (moderate): Secondary | ICD-10-CM | POA: Diagnosis not present

## 2018-11-20 DIAGNOSIS — E049 Nontoxic goiter, unspecified: Secondary | ICD-10-CM | POA: Diagnosis not present

## 2018-11-20 DIAGNOSIS — E1129 Type 2 diabetes mellitus with other diabetic kidney complication: Secondary | ICD-10-CM | POA: Diagnosis not present

## 2018-11-27 ENCOUNTER — Other Ambulatory Visit: Payer: Self-pay | Admitting: Family Medicine

## 2018-11-27 DIAGNOSIS — Z1231 Encounter for screening mammogram for malignant neoplasm of breast: Secondary | ICD-10-CM

## 2018-12-09 MED FILL — PENTIPS 31G X 5 MM MISC: 31G X 5 MM | 90 days supply | Qty: 300 | Fill #0

## 2018-12-12 MED FILL — HUMULIN R 500 UNITS/ML KWIK: 500 | 90 days supply | Qty: 36 | Fill #2

## 2018-12-16 MED FILL — BYSTOLIC 10 MG TABLET: 10 | 90 days supply | Qty: 180 | Fill #1

## 2018-12-16 MED FILL — GABAPENTIN 300 MG CAPSULE: 300 | 90 days supply | Qty: 90 | Fill #2

## 2018-12-16 MED FILL — HYDROCHLOROTHIAZIDE 25 MG T: 25 | 90 days supply | Qty: 90 | Fill #1

## 2018-12-16 MED FILL — ATORVASTATIN 20 MG TABLET: 20 | 90 days supply | Qty: 90 | Fill #1

## 2018-12-16 MED FILL — CYCLOBENZAPRINE HCL 10 MG T: 10 | 90 days supply | Qty: 90 | Fill #2

## 2018-12-17 MED FILL — FREESTYLE LITE TEST STRIP: 43 days supply | Qty: 300 | Fill #0

## 2018-12-31 ENCOUNTER — Ambulatory Visit: Payer: PPO

## 2019-01-01 DIAGNOSIS — N183 Chronic kidney disease, stage 3 (moderate): Secondary | ICD-10-CM | POA: Diagnosis not present

## 2019-01-01 DIAGNOSIS — E1129 Type 2 diabetes mellitus with other diabetic kidney complication: Secondary | ICD-10-CM | POA: Diagnosis not present

## 2019-01-01 DIAGNOSIS — Z794 Long term (current) use of insulin: Secondary | ICD-10-CM | POA: Diagnosis not present

## 2019-01-01 DIAGNOSIS — I10 Essential (primary) hypertension: Secondary | ICD-10-CM | POA: Diagnosis not present

## 2019-02-24 ENCOUNTER — Ambulatory Visit
Admission: RE | Admit: 2019-02-24 | Discharge: 2019-02-24 | Disposition: A | Discharge status: Home / Self Care | Payer: PPO | Source: Ambulatory Visit | Attending: Family Medicine | Admitting: Family Medicine

## 2019-02-24 ENCOUNTER — Other Ambulatory Visit: Payer: Self-pay

## 2019-02-24 DIAGNOSIS — Z1231 Encounter for screening mammogram for malignant neoplasm of breast: Secondary | ICD-10-CM | POA: Diagnosis not present

## 2019-03-13 DIAGNOSIS — H25013 Cortical age-related cataract, bilateral: Secondary | ICD-10-CM | POA: Diagnosis not present

## 2019-03-13 DIAGNOSIS — H402231 Chronic angle-closure glaucoma, bilateral, mild stage: Secondary | ICD-10-CM | POA: Diagnosis not present

## 2019-03-13 DIAGNOSIS — H2513 Age-related nuclear cataract, bilateral: Secondary | ICD-10-CM | POA: Diagnosis not present

## 2019-03-13 DIAGNOSIS — E119 Type 2 diabetes mellitus without complications: Secondary | ICD-10-CM | POA: Diagnosis not present

## 2019-03-25 MED FILL — FREESTYLE LITE TEST STRIP: 43 days supply | Qty: 300 | Fill #1

## 2019-03-25 MED FILL — HUMULIN R 500 UNITS/ML KWIK: 500 | 90 days supply | Qty: 36 | Fill #3

## 2019-03-25 MED FILL — HYDROCHLOROTHIAZIDE 25 MG T: 25 | 90 days supply | Qty: 90 | Fill #0

## 2019-03-25 MED FILL — ATORVASTATIN 20 MG TABLET: 20 | 90 days supply | Qty: 90 | Fill #0

## 2019-03-25 MED FILL — GABAPENTIN 300 MG CAPSULE: 300 | 90 days supply | Qty: 90 | Fill #3

## 2019-03-25 MED FILL — CYCLOBENZAPRINE HCL 10 MG T: 10 | 90 days supply | Qty: 90 | Fill #3

## 2019-03-25 MED FILL — BYSTOLIC 10 MG TABLET: 10 | 90 days supply | Qty: 180 | Fill #0

## 2019-03-25 MED FILL — PENTIPS 31G X 5 MM MISC: 31G X 5 MM | 90 days supply | Qty: 300 | Fill #1

## 2019-04-10 DIAGNOSIS — E049 Nontoxic goiter, unspecified: Secondary | ICD-10-CM | POA: Diagnosis not present

## 2019-04-10 DIAGNOSIS — E1129 Type 2 diabetes mellitus with other diabetic kidney complication: Secondary | ICD-10-CM | POA: Diagnosis not present

## 2019-04-10 DIAGNOSIS — H409 Unspecified glaucoma: Secondary | ICD-10-CM | POA: Diagnosis not present

## 2019-04-10 DIAGNOSIS — Z79899 Other long term (current) drug therapy: Secondary | ICD-10-CM | POA: Diagnosis not present

## 2019-04-10 DIAGNOSIS — M109 Gout, unspecified: Secondary | ICD-10-CM | POA: Diagnosis not present

## 2019-04-10 DIAGNOSIS — N200 Calculus of kidney: Secondary | ICD-10-CM | POA: Diagnosis not present

## 2019-04-10 DIAGNOSIS — I10 Essential (primary) hypertension: Secondary | ICD-10-CM | POA: Diagnosis not present

## 2019-04-10 DIAGNOSIS — Z794 Long term (current) use of insulin: Secondary | ICD-10-CM | POA: Diagnosis not present

## 2019-04-10 DIAGNOSIS — D649 Anemia, unspecified: Secondary | ICD-10-CM | POA: Diagnosis not present

## 2019-04-10 DIAGNOSIS — N183 Chronic kidney disease, stage 3 (moderate): Secondary | ICD-10-CM | POA: Diagnosis not present

## 2019-04-16 MED FILL — ALLOPURINOL 100 MG TABS: 100 | 90 days supply | Qty: 90 | Fill #0

## 2019-04-17 MED FILL — BYSTOLIC 10 MG TABLET: 10 | 30 days supply | Qty: 60 | Fill #0

## 2019-04-17 MED FILL — HUMULIN R 500 UNITS/ML KWIK: 500 | 30 days supply | Qty: 12 | Fill #4

## 2019-04-17 MED FILL — COLCHICINE 0.6 MG TABS: 0.6 | 30 days supply | Qty: 30 | Fill #0

## 2019-06-18 DIAGNOSIS — N183 Chronic kidney disease, stage 3 (moderate): Secondary | ICD-10-CM | POA: Diagnosis not present

## 2019-06-18 DIAGNOSIS — E1129 Type 2 diabetes mellitus with other diabetic kidney complication: Secondary | ICD-10-CM | POA: Diagnosis not present

## 2019-06-18 DIAGNOSIS — Z794 Long term (current) use of insulin: Secondary | ICD-10-CM | POA: Diagnosis not present

## 2019-06-18 DIAGNOSIS — I10 Essential (primary) hypertension: Secondary | ICD-10-CM | POA: Diagnosis not present

## 2019-06-24 MED FILL — FARXIGA 10 MG TABLET: 10 | 30 days supply | Qty: 30 | Fill #0

## 2019-06-27 MED FILL — FREESTYLE LITE TEST STRIP: 43 days supply | Qty: 300 | Fill #2

## 2019-06-27 MED FILL — CYCLOBENZAPRINE HCL 10 MG T: 10 | 90 days supply | Qty: 90 | Fill #0

## 2019-06-27 MED FILL — ALLOPURINOL 100 MG TABS: 100 | 90 days supply | Qty: 90 | Fill #1

## 2019-06-27 MED FILL — COLCHICINE 0.6 MG TABS: 0.6 | 30 days supply | Qty: 30 | Fill #1

## 2019-06-27 MED FILL — HUMULIN R 500 UNITS/ML KWIK: 500 | 30 days supply | Qty: 12 | Fill #5

## 2019-06-27 MED FILL — BYSTOLIC 10 MG TABLET: 10 | 30 days supply | Qty: 60 | Fill #0

## 2019-06-27 MED FILL — GABAPENTIN 300 MG CAPSULE: 300 | 90 days supply | Qty: 90 | Fill #0

## 2019-06-27 MED FILL — PENTIPS 31G X 5 MM MISC: 31G X 5 MM | 90 days supply | Qty: 300 | Fill #0

## 2019-06-27 MED FILL — ATORVASTATIN 20 MG TABLET: 20 | 90 days supply | Qty: 90 | Fill #1

## 2019-06-27 MED FILL — HYDROCHLOROTHIAZIDE 25 MG T: 25 | 90 days supply | Qty: 90 | Fill #1

## 2019-07-22 MED FILL — HUMULIN R 500 UNITS/ML KWIK: 500 | 30 days supply | Qty: 12 | Fill #0

## 2019-08-08 DIAGNOSIS — E1129 Type 2 diabetes mellitus with other diabetic kidney complication: Secondary | ICD-10-CM | POA: Diagnosis not present

## 2019-08-08 DIAGNOSIS — H409 Unspecified glaucoma: Secondary | ICD-10-CM | POA: Diagnosis not present

## 2019-08-08 DIAGNOSIS — E049 Nontoxic goiter, unspecified: Secondary | ICD-10-CM | POA: Diagnosis not present

## 2019-08-08 DIAGNOSIS — N200 Calculus of kidney: Secondary | ICD-10-CM | POA: Diagnosis not present

## 2019-08-08 DIAGNOSIS — N1832 Chronic kidney disease, stage 3b: Secondary | ICD-10-CM | POA: Diagnosis not present

## 2019-08-08 DIAGNOSIS — D649 Anemia, unspecified: Secondary | ICD-10-CM | POA: Diagnosis not present

## 2019-08-08 DIAGNOSIS — M109 Gout, unspecified: Secondary | ICD-10-CM | POA: Diagnosis not present

## 2019-08-08 DIAGNOSIS — E785 Hyperlipidemia, unspecified: Secondary | ICD-10-CM | POA: Diagnosis not present

## 2019-08-08 DIAGNOSIS — Z794 Long term (current) use of insulin: Secondary | ICD-10-CM | POA: Diagnosis not present

## 2019-08-08 DIAGNOSIS — I10 Essential (primary) hypertension: Secondary | ICD-10-CM | POA: Diagnosis not present

## 2019-08-08 DIAGNOSIS — R7401 Elevation of levels of liver transaminase levels: Secondary | ICD-10-CM | POA: Diagnosis not present

## 2019-08-19 MED FILL — HUMULIN R 500 UNITS/ML KWIK: 500 | 30 days supply | Qty: 12 | Fill #1

## 2019-08-19 MED FILL — BYSTOLIC 10 MG TABLET: 10 | 30 days supply | Qty: 60 | Fill #1

## 2019-09-22 MED FILL — BYSTOLIC 10 MG TABLET: 10 | 90 days supply | Qty: 180 | Fill #2

## 2019-09-22 MED FILL — HYDROCHLOROTHIAZIDE 25 MG T: 25 | 90 days supply | Qty: 90 | Fill #2

## 2019-09-22 MED FILL — COLCHICINE 0.6 MG TABS: 0.6 | 30 days supply | Qty: 30 | Fill #2

## 2019-09-22 MED FILL — PENTIPS 31G X 5 MM MISC: 31G X 5 MM | 90 days supply | Qty: 300 | Fill #1

## 2019-09-22 MED FILL — CYCLOBENZAPRINE HCL 10 MG T: 10 | 90 days supply | Qty: 90 | Fill #1

## 2019-09-22 MED FILL — GABAPENTIN 300 MG CAPSULE: 300 | 90 days supply | Qty: 90 | Fill #1

## 2019-09-22 MED FILL — ATORVASTATIN 20 MG TABLET: 20 | 90 days supply | Qty: 90 | Fill #2

## 2019-09-22 MED FILL — HUMULIN R 500 UNITS/ML KWIK: 500 | 90 days supply | Qty: 36 | Fill #2

## 2019-09-22 MED FILL — ALLOPURINOL 100 MG TABS: 100 | 90 days supply | Qty: 90 | Fill #2

## 2019-09-24 DIAGNOSIS — N1832 Chronic kidney disease, stage 3b: Secondary | ICD-10-CM | POA: Diagnosis not present

## 2019-09-24 DIAGNOSIS — E1129 Type 2 diabetes mellitus with other diabetic kidney complication: Secondary | ICD-10-CM | POA: Diagnosis not present

## 2019-09-24 DIAGNOSIS — I10 Essential (primary) hypertension: Secondary | ICD-10-CM | POA: Diagnosis not present

## 2019-09-24 DIAGNOSIS — Z794 Long term (current) use of insulin: Secondary | ICD-10-CM | POA: Diagnosis not present

## 2019-10-31 DIAGNOSIS — S060X9A Concussion with loss of consciousness of unspecified duration, initial encounter: Secondary | ICD-10-CM | POA: Diagnosis not present

## 2019-10-31 DIAGNOSIS — R55 Syncope and collapse: Secondary | ICD-10-CM | POA: Diagnosis not present

## 2019-10-31 DIAGNOSIS — S0181XA Laceration without foreign body of other part of head, initial encounter: Secondary | ICD-10-CM | POA: Diagnosis not present

## 2019-10-31 DIAGNOSIS — S3991XA Unspecified injury of abdomen, initial encounter: Secondary | ICD-10-CM | POA: Diagnosis not present

## 2019-10-31 DIAGNOSIS — R112 Nausea with vomiting, unspecified: Secondary | ICD-10-CM | POA: Diagnosis not present

## 2019-10-31 DIAGNOSIS — S3993XA Unspecified injury of pelvis, initial encounter: Secondary | ICD-10-CM | POA: Diagnosis not present

## 2019-10-31 DIAGNOSIS — S060X0A Concussion without loss of consciousness, initial encounter: Secondary | ICD-10-CM | POA: Diagnosis not present

## 2019-10-31 DIAGNOSIS — S0091XA Abrasion of unspecified part of head, initial encounter: Secondary | ICD-10-CM | POA: Diagnosis not present

## 2019-10-31 DIAGNOSIS — E119 Type 2 diabetes mellitus without complications: Secondary | ICD-10-CM | POA: Diagnosis not present

## 2019-10-31 DIAGNOSIS — S299XXA Unspecified injury of thorax, initial encounter: Secondary | ICD-10-CM | POA: Diagnosis not present

## 2019-10-31 DIAGNOSIS — S0081XA Abrasion of other part of head, initial encounter: Secondary | ICD-10-CM | POA: Diagnosis not present

## 2019-11-02 ENCOUNTER — Ambulatory Visit: Payer: PPO | Attending: Internal Medicine

## 2019-11-02 DIAGNOSIS — Z23 Encounter for immunization: Secondary | ICD-10-CM | POA: Insufficient documentation

## 2019-11-02 NOTE — Progress Notes (Signed)
   Covid-19 Vaccination Clinic  Name:  Kristine Gray    MRN: [id number] DOB: February 25, 1951  11/02/2019  [name] was observed post Covid-19 immunization for 15 minutes without incidence. She was provided with Vaccine Information Sheet and instruction to access the V-Safe system.   [name] was instructed to call 911 with any severe reactions post vaccine: Marland Kitchen Difficulty breathing  . Swelling of your face and throat  . A fast heartbeat  . A bad rash all over your body  . Dizziness and weakness    Immunizations Administered    Name Date Dose VIS Date Route   Pfizer COVID-19 Vaccine 11/02/2019  4:00 PM 0.3 mL 09/05/2019 Intramuscular   Manufacturer: [place]   Lot: [id number]   [place]: [id number]

## 2019-11-04 DIAGNOSIS — E1169 Type 2 diabetes mellitus with other specified complication: Secondary | ICD-10-CM | POA: Diagnosis not present

## 2019-11-04 DIAGNOSIS — E785 Hyperlipidemia, unspecified: Secondary | ICD-10-CM | POA: Diagnosis not present

## 2019-11-04 DIAGNOSIS — E1122 Type 2 diabetes mellitus with diabetic chronic kidney disease: Secondary | ICD-10-CM | POA: Diagnosis not present

## 2019-11-04 DIAGNOSIS — Z87898 Personal history of other specified conditions: Secondary | ICD-10-CM | POA: Diagnosis not present

## 2019-11-04 DIAGNOSIS — N183 Chronic kidney disease, stage 3 unspecified: Secondary | ICD-10-CM | POA: Diagnosis not present

## 2019-11-04 DIAGNOSIS — E1159 Type 2 diabetes mellitus with other circulatory complications: Secondary | ICD-10-CM | POA: Diagnosis not present

## 2019-11-04 DIAGNOSIS — I10 Essential (primary) hypertension: Secondary | ICD-10-CM | POA: Diagnosis not present

## 2019-11-18 ENCOUNTER — Ambulatory Visit: Payer: PPO

## 2019-11-27 ENCOUNTER — Ambulatory Visit: Payer: PPO | Attending: Internal Medicine

## 2019-11-27 DIAGNOSIS — Z23 Encounter for immunization: Secondary | ICD-10-CM

## 2019-11-27 NOTE — Progress Notes (Signed)
   Covid-19 Vaccination Clinic  Name:  EDLA BURNOR    MRN: [id number] DOB: Nov 20, 1950  11/27/2019  Ms. Herbert was observed post Covid-19 immunization for 15 minutes without incident. She was provided with Vaccine Information Sheet and instruction to access the V-Safe system.   Ms. Gambles was instructed to call 911 with any severe reactions post vaccine: Marland Kitchen Difficulty breathing  . Swelling of face and throat  . A fast heartbeat  . A bad rash all over body  . Dizziness and weakness   Immunizations Administered    Name Date Dose VIS Date Route   Pfizer COVID-19 Vaccine 11/27/2019  2:59 PM 0.3 mL 09/05/2019 Intramuscular   Manufacturer: [place]   Lot: [id number]   [place]: [id number]

## 2019-12-02 DIAGNOSIS — Z0000 Encounter for general adult medical examination without abnormal findings: Secondary | ICD-10-CM | POA: Diagnosis not present

## 2019-12-02 DIAGNOSIS — Z780 Asymptomatic menopausal state: Secondary | ICD-10-CM | POA: Diagnosis not present

## 2019-12-04 ENCOUNTER — Other Ambulatory Visit (HOSPITAL_BASED_OUTPATIENT_CLINIC_OR_DEPARTMENT_OTHER): Payer: Self-pay | Admitting: Endocrinology

## 2019-12-04 DIAGNOSIS — H409 Unspecified glaucoma: Secondary | ICD-10-CM | POA: Diagnosis not present

## 2019-12-04 DIAGNOSIS — N200 Calculus of kidney: Secondary | ICD-10-CM | POA: Diagnosis not present

## 2019-12-04 DIAGNOSIS — R7401 Elevation of levels of liver transaminase levels: Secondary | ICD-10-CM | POA: Diagnosis not present

## 2019-12-04 DIAGNOSIS — R55 Syncope and collapse: Secondary | ICD-10-CM | POA: Diagnosis not present

## 2019-12-04 DIAGNOSIS — M109 Gout, unspecified: Secondary | ICD-10-CM | POA: Diagnosis not present

## 2019-12-04 DIAGNOSIS — I129 Hypertensive chronic kidney disease with stage 1 through stage 4 chronic kidney disease, or unspecified chronic kidney disease: Secondary | ICD-10-CM | POA: Diagnosis not present

## 2019-12-04 DIAGNOSIS — N1832 Chronic kidney disease, stage 3b: Secondary | ICD-10-CM | POA: Diagnosis not present

## 2019-12-04 DIAGNOSIS — E1129 Type 2 diabetes mellitus with other diabetic kidney complication: Secondary | ICD-10-CM | POA: Diagnosis not present

## 2019-12-04 DIAGNOSIS — E785 Hyperlipidemia, unspecified: Secondary | ICD-10-CM | POA: Diagnosis not present

## 2019-12-04 DIAGNOSIS — E049 Nontoxic goiter, unspecified: Secondary | ICD-10-CM | POA: Diagnosis not present

## 2019-12-04 MED FILL — FREESTYLE LIBRE 14 DAY SENS: 28 days supply | Qty: 2 | Fill #0

## 2019-12-11 MED FILL — HUMULIN R 500 UNITS/ML KWIK: 500 | 90 days supply | Qty: 36 | Fill #3

## 2019-12-15 MED FILL — ALLOPURINOL 100 MG TABS: 100 | 90 days supply | Qty: 90 | Fill #3

## 2019-12-17 MED FILL — GABAPENTIN 300 MG CAPSULE: 300 | 90 days supply | Qty: 90 | Fill #0

## 2019-12-17 MED FILL — ATORVASTATIN 20 MG TABLET: 20 | 90 days supply | Qty: 90 | Fill #0

## 2019-12-17 MED FILL — PENTIPS 31G X 5 MM MISC: 31G X 5 MM | 90 days supply | Qty: 300 | Fill #0

## 2019-12-17 MED FILL — HYDROCHLOROTHIAZIDE 25 MG T: 25 | 90 days supply | Qty: 90 | Fill #0

## 2019-12-18 MED FILL — CYCLOBENZAPRINE HCL 10 MG T: 10 | 90 days supply | Qty: 90 | Fill #0

## 2019-12-25 ENCOUNTER — Other Ambulatory Visit (HOSPITAL_BASED_OUTPATIENT_CLINIC_OR_DEPARTMENT_OTHER): Payer: Self-pay | Admitting: Endocrinology

## 2019-12-25 MED FILL — BYSTOLIC 10 MG TABLET: 10 | 90 days supply | Qty: 180 | Fill #0

## 2020-01-01 MED FILL — FREESTYLE LIBRE 2 SENSOR SY: 28 days supply | Qty: 2 | Fill #0

## 2020-01-08 DIAGNOSIS — Z780 Asymptomatic menopausal state: Secondary | ICD-10-CM | POA: Diagnosis not present

## 2020-01-30 MED FILL — FREESTYLE LIBRE 2 SENSOR SY: 28 days supply | Qty: 2 | Fill #1

## 2020-02-05 DIAGNOSIS — N1832 Chronic kidney disease, stage 3b: Secondary | ICD-10-CM | POA: Diagnosis not present

## 2020-02-05 DIAGNOSIS — Z794 Long term (current) use of insulin: Secondary | ICD-10-CM | POA: Diagnosis not present

## 2020-02-05 DIAGNOSIS — E1129 Type 2 diabetes mellitus with other diabetic kidney complication: Secondary | ICD-10-CM | POA: Diagnosis not present

## 2020-02-05 DIAGNOSIS — E7849 Other hyperlipidemia: Secondary | ICD-10-CM | POA: Diagnosis not present

## 2020-02-05 DIAGNOSIS — I129 Hypertensive chronic kidney disease with stage 1 through stage 4 chronic kidney disease, or unspecified chronic kidney disease: Secondary | ICD-10-CM | POA: Diagnosis not present

## 2020-03-25 ENCOUNTER — Other Ambulatory Visit (HOSPITAL_BASED_OUTPATIENT_CLINIC_OR_DEPARTMENT_OTHER): Payer: Self-pay | Admitting: Endocrinology

## 2020-03-25 MED FILL — GABAPENTIN 300 MG CAPSULE: 300 | 90 days supply | Qty: 90 | Fill #1

## 2020-03-25 MED FILL — HYDROCHLOROTHIAZIDE 25 MG T: 25 | 90 days supply | Qty: 90 | Fill #1

## 2020-03-25 MED FILL — ATORVASTATIN 20 MG TABLET: 20 | 90 days supply | Qty: 90 | Fill #1

## 2020-03-25 MED FILL — HUMULIN R 500 UNITS/ML KWIK: 500 | 30 days supply | Qty: 12 | Fill #5

## 2020-03-25 MED FILL — CYCLOBENZAPRINE HCL 10 MG T: 10 | 90 days supply | Qty: 90 | Fill #1

## 2020-03-25 MED FILL — ALLOPURINOL 100 MG TABS: 100 | 90 days supply | Qty: 90 | Fill #0

## 2020-03-25 MED FILL — FREESTYLE LIBRE 2 SENSOR SY: 28 days supply | Qty: 2 | Fill #3

## 2020-04-09 DIAGNOSIS — H409 Unspecified glaucoma: Secondary | ICD-10-CM | POA: Diagnosis not present

## 2020-04-09 DIAGNOSIS — E1129 Type 2 diabetes mellitus with other diabetic kidney complication: Secondary | ICD-10-CM | POA: Diagnosis not present

## 2020-04-09 DIAGNOSIS — E7849 Other hyperlipidemia: Secondary | ICD-10-CM | POA: Diagnosis not present

## 2020-04-09 DIAGNOSIS — I10 Essential (primary) hypertension: Secondary | ICD-10-CM | POA: Diagnosis not present

## 2020-04-09 DIAGNOSIS — R7401 Elevation of levels of liver transaminase levels: Secondary | ICD-10-CM | POA: Diagnosis not present

## 2020-04-09 DIAGNOSIS — N1832 Chronic kidney disease, stage 3b: Secondary | ICD-10-CM | POA: Diagnosis not present

## 2020-04-09 DIAGNOSIS — Z794 Long term (current) use of insulin: Secondary | ICD-10-CM | POA: Diagnosis not present

## 2020-04-09 DIAGNOSIS — E049 Nontoxic goiter, unspecified: Secondary | ICD-10-CM | POA: Diagnosis not present

## 2020-04-09 DIAGNOSIS — M109 Gout, unspecified: Secondary | ICD-10-CM | POA: Diagnosis not present

## 2020-04-09 DIAGNOSIS — N200 Calculus of kidney: Secondary | ICD-10-CM | POA: Diagnosis not present

## 2020-04-09 DIAGNOSIS — I700 Atherosclerosis of aorta: Secondary | ICD-10-CM | POA: Diagnosis not present

## 2020-04-23 MED FILL — HUMULIN R 500 UNITS/ML KWIK: 500 | 30 days supply | Qty: 12 | Fill #6

## 2020-04-23 MED FILL — FREESTYLE LIBRE 2 SENSOR SY: 28 days supply | Qty: 2 | Fill #4

## 2020-04-23 MED FILL — PENTIPS 31G X 5 MM MISC: 31G X 5 MM | 90 days supply | Qty: 300 | Fill #1

## 2020-04-23 MED FILL — BYSTOLIC 10 MG TABLET: 10 | 30 days supply | Qty: 60 | Fill #1

## 2020-05-19 MED FILL — BYSTOLIC 10 MG TABLET: 10 | 30 days supply | Qty: 60 | Fill #2

## 2020-05-19 MED FILL — HUMULIN R 500 UNITS/ML KWIK: 500 | 30 days supply | Qty: 12 | Fill #7

## 2020-05-19 MED FILL — FREESTYLE LIBRE 2 SENSOR MI: 28 days supply | Qty: 2 | Fill #5

## 2020-05-20 DIAGNOSIS — N1832 Chronic kidney disease, stage 3b: Secondary | ICD-10-CM | POA: Diagnosis not present

## 2020-05-20 DIAGNOSIS — E1129 Type 2 diabetes mellitus with other diabetic kidney complication: Secondary | ICD-10-CM | POA: Diagnosis not present

## 2020-05-20 DIAGNOSIS — I129 Hypertensive chronic kidney disease with stage 1 through stage 4 chronic kidney disease, or unspecified chronic kidney disease: Secondary | ICD-10-CM | POA: Diagnosis not present

## 2020-05-20 DIAGNOSIS — Z794 Long term (current) use of insulin: Secondary | ICD-10-CM | POA: Diagnosis not present

## 2020-06-14 MED FILL — HUMULIN R 500 UNITS/ML KWIK: 500 | 30 days supply | Qty: 12 | Fill #8

## 2020-06-14 MED FILL — FREESTYLE LIBRE 2 SENSOR MI: 28 days supply | Qty: 2 | Fill #6

## 2020-06-28 ENCOUNTER — Other Ambulatory Visit (HOSPITAL_BASED_OUTPATIENT_CLINIC_OR_DEPARTMENT_OTHER): Payer: Self-pay | Admitting: Endocrinology

## 2020-06-28 MED FILL — ALLOPURINOL 100 MG TABS: 100 | 90 days supply | Qty: 90 | Fill #1

## 2020-06-28 MED FILL — CYCLOBENZAPRINE HCL 10 MG T: 10 | 90 days supply | Qty: 90 | Fill #0

## 2020-06-28 MED FILL — ATORVASTATIN CALCIUM 20 MG: 20 | 90 days supply | Qty: 90 | Fill #2

## 2020-06-28 MED FILL — BYSTOLIC 10 MG TABLET: 10 | 30 days supply | Qty: 60 | Fill #3

## 2020-06-28 MED FILL — GABAPENTIN 300 MG CAPSULE: 300 | 90 days supply | Qty: 90 | Fill #0

## 2020-06-28 MED FILL — HYDROCHLOROTHIAZIDE 25 MG T: 25 | 90 days supply | Qty: 90 | Fill #2

## 2020-07-05 MED FILL — FREESTYLE LIBRE 2 SENSOR MI: 28 days supply | Qty: 2 | Fill #7

## 2020-07-07 MED FILL — HUMULIN R 500 UNITS/ML KWIK: 500 | 30 days supply | Qty: 12 | Fill #9

## 2020-07-14 ENCOUNTER — Emergency Department (HOSPITAL_BASED_OUTPATIENT_CLINIC_OR_DEPARTMENT_OTHER): Payer: PPO

## 2020-07-14 ENCOUNTER — Inpatient Hospital Stay (HOSPITAL_BASED_OUTPATIENT_CLINIC_OR_DEPARTMENT_OTHER)
Admission: EM | Admit: 2020-07-14 | Discharge: 2020-07-17 | DRG: 026 | Disposition: A | Discharge status: Home / Self Care | Payer: PPO | Attending: Internal Medicine | Admitting: Internal Medicine

## 2020-07-14 ENCOUNTER — Other Ambulatory Visit: Payer: Self-pay

## 2020-07-14 ENCOUNTER — Encounter (HOSPITAL_BASED_OUTPATIENT_CLINIC_OR_DEPARTMENT_OTHER): Payer: Self-pay | Admitting: *Deleted

## 2020-07-14 DIAGNOSIS — R112 Nausea with vomiting, unspecified: Secondary | ICD-10-CM | POA: Diagnosis present

## 2020-07-14 DIAGNOSIS — E7841 Elevated Lipoprotein(a): Secondary | ICD-10-CM | POA: Diagnosis not present

## 2020-07-14 DIAGNOSIS — E11649 Type 2 diabetes mellitus with hypoglycemia without coma: Secondary | ICD-10-CM | POA: Diagnosis not present

## 2020-07-14 DIAGNOSIS — G9389 Other specified disorders of brain: Secondary | ICD-10-CM | POA: Diagnosis not present

## 2020-07-14 DIAGNOSIS — Z79899 Other long term (current) drug therapy: Secondary | ICD-10-CM

## 2020-07-14 DIAGNOSIS — K219 Gastro-esophageal reflux disease without esophagitis: Secondary | ICD-10-CM | POA: Diagnosis present

## 2020-07-14 DIAGNOSIS — Z880 Allergy status to penicillin: Secondary | ICD-10-CM | POA: Diagnosis not present

## 2020-07-14 DIAGNOSIS — R9082 White matter disease, unspecified: Secondary | ICD-10-CM | POA: Diagnosis not present

## 2020-07-14 DIAGNOSIS — E119 Type 2 diabetes mellitus without complications: Secondary | ICD-10-CM | POA: Diagnosis not present

## 2020-07-14 DIAGNOSIS — Z8249 Family history of ischemic heart disease and other diseases of the circulatory system: Secondary | ICD-10-CM

## 2020-07-14 DIAGNOSIS — N189 Chronic kidney disease, unspecified: Secondary | ICD-10-CM | POA: Diagnosis present

## 2020-07-14 DIAGNOSIS — N179 Acute kidney failure, unspecified: Secondary | ICD-10-CM | POA: Diagnosis present

## 2020-07-14 DIAGNOSIS — E782 Mixed hyperlipidemia: Secondary | ICD-10-CM | POA: Diagnosis not present

## 2020-07-14 DIAGNOSIS — I6782 Cerebral ischemia: Secondary | ICD-10-CM | POA: Diagnosis not present

## 2020-07-14 DIAGNOSIS — Z6841 Body Mass Index (BMI) 40.0 and over, adult: Secondary | ICD-10-CM | POA: Diagnosis not present

## 2020-07-14 DIAGNOSIS — Z87442 Personal history of urinary calculi: Secondary | ICD-10-CM | POA: Diagnosis not present

## 2020-07-14 DIAGNOSIS — Z7984 Long term (current) use of oral hypoglycemic drugs: Secondary | ICD-10-CM

## 2020-07-14 DIAGNOSIS — Z20822 Contact with and (suspected) exposure to covid-19: Secondary | ICD-10-CM | POA: Diagnosis present

## 2020-07-14 DIAGNOSIS — I6522 Occlusion and stenosis of left carotid artery: Principal | ICD-10-CM | POA: Diagnosis present

## 2020-07-14 DIAGNOSIS — R29818 Other symptoms and signs involving the nervous system: Secondary | ICD-10-CM | POA: Diagnosis not present

## 2020-07-14 DIAGNOSIS — Z794 Long term (current) use of insulin: Secondary | ICD-10-CM | POA: Diagnosis not present

## 2020-07-14 DIAGNOSIS — R4701 Aphasia: Secondary | ICD-10-CM | POA: Diagnosis present

## 2020-07-14 DIAGNOSIS — G459 Transient cerebral ischemic attack, unspecified: Secondary | ICD-10-CM | POA: Diagnosis present

## 2020-07-14 DIAGNOSIS — E785 Hyperlipidemia, unspecified: Secondary | ICD-10-CM | POA: Diagnosis present

## 2020-07-14 DIAGNOSIS — Z803 Family history of malignant neoplasm of breast: Secondary | ICD-10-CM | POA: Diagnosis not present

## 2020-07-14 DIAGNOSIS — I10 Essential (primary) hypertension: Secondary | ICD-10-CM | POA: Diagnosis present

## 2020-07-14 DIAGNOSIS — E1149 Type 2 diabetes mellitus with other diabetic neurological complication: Secondary | ICD-10-CM | POA: Diagnosis not present

## 2020-07-14 DIAGNOSIS — Z888 Allergy status to other drugs, medicaments and biological substances status: Secondary | ICD-10-CM

## 2020-07-14 DIAGNOSIS — Z8719 Personal history of other diseases of the digestive system: Secondary | ICD-10-CM | POA: Diagnosis not present

## 2020-07-14 DIAGNOSIS — R4781 Slurred speech: Secondary | ICD-10-CM | POA: Diagnosis not present

## 2020-07-14 DIAGNOSIS — Z7982 Long term (current) use of aspirin: Secondary | ICD-10-CM

## 2020-07-14 DIAGNOSIS — E876 Hypokalemia: Secondary | ICD-10-CM | POA: Diagnosis not present

## 2020-07-14 DIAGNOSIS — D72829 Elevated white blood cell count, unspecified: Secondary | ICD-10-CM | POA: Diagnosis present

## 2020-07-14 LAB — RESPIRATORY PANEL BY RT PCR (FLU A&B, COVID)
Influenza A by PCR: NEGATIVE
Influenza B by PCR: NEGATIVE
SARS Coronavirus 2 by RT PCR: NEGATIVE

## 2020-07-14 LAB — DIFFERENTIAL
Abs Immature Granulocytes: 0.03 10*3/uL (ref 0.00–0.07)
Basophils Absolute: 0.1 10*3/uL (ref 0.0–0.1)
Basophils Relative: 0 %
Eosinophils Absolute: 0.1 10*3/uL (ref 0.0–0.5)
Eosinophils Relative: 1 %
Immature Granulocytes: 0 %
Lymphocytes Relative: 43 %
Lymphs Abs: 5.5 10*3/uL — ABNORMAL HIGH (ref 0.7–4.0)
Monocytes Absolute: 1.3 10*3/uL — ABNORMAL HIGH (ref 0.1–1.0)
Monocytes Relative: 11 %
Neutro Abs: 5.8 10*3/uL (ref 1.7–7.7)
Neutrophils Relative %: 45 %

## 2020-07-14 LAB — CBC
HCT: 44.5 % (ref 36.0–46.0)
Hemoglobin: 15.4 g/dL — ABNORMAL HIGH (ref 12.0–15.0)
MCH: 30.9 pg (ref 26.0–34.0)
MCHC: 34.6 g/dL (ref 30.0–36.0)
MCV: 89.4 fL (ref 80.0–100.0)
Platelets: 255 10*3/uL (ref 150–400)
RBC: 4.98 MIL/uL (ref 3.87–5.11)
RDW: 13.6 % (ref 11.5–15.5)
WBC: 12.8 10*3/uL — ABNORMAL HIGH (ref 4.0–10.5)
nRBC: 0 % (ref 0.0–0.2)

## 2020-07-14 LAB — ETHANOL: Alcohol, Ethyl (B): <10 mg/dL (ref <10)

## 2020-07-14 LAB — COMPREHENSIVE METABOLIC PANEL
ALT: 44 U/L (ref 0–44)
AST: 42 U/L — ABNORMAL HIGH (ref 15–41)
Albumin: 4 g/dL (ref 3.5–5.0)
Alkaline Phosphatase: 69 U/L (ref 38–126)
Anion gap: 14 (ref 5–15)
BUN: 19 mg/dL (ref 8–23)
CO2: 25 mmol/L (ref 22–32)
Calcium: 9.5 mg/dL (ref 8.9–10.3)
Chloride: 89 mmol/L — ABNORMAL LOW (ref 98–111)
Creatinine, Ser: 1.61 mg/dL — ABNORMAL HIGH (ref 0.44–1.00)
GFR, Estimated: 32 mL/min — ABNORMAL LOW (ref >60)
Glucose, Bld: 167 mg/dL — ABNORMAL HIGH (ref 70–99)
Potassium: 3.5 mmol/L (ref 3.5–5.1)
Sodium: 128 mmol/L — ABNORMAL LOW (ref 135–145)
Total Bilirubin: 1.2 mg/dL (ref 0.3–1.2)
Total Protein: 7.5 g/dL (ref 6.5–8.1)

## 2020-07-14 LAB — URINALYSIS, ROUTINE W REFLEX MICROSCOPIC
Bilirubin Urine: NEGATIVE
Glucose, UA: 250 mg/dL — AB
Hgb urine dipstick: NEGATIVE
Ketones, ur: NEGATIVE mg/dL
Leukocytes,Ua: NEGATIVE
Nitrite: NEGATIVE
Protein, ur: NEGATIVE mg/dL
Specific Gravity, Urine: <1.005 — ABNORMAL LOW (ref 1.005–1.030)
pH: 6 (ref 5.0–8.0)

## 2020-07-14 LAB — RAPID URINE DRUG SCREEN, HOSP PERFORMED
Amphetamines: NOT DETECTED
Barbiturates: NOT DETECTED
Benzodiazepines: NOT DETECTED
Cocaine: NOT DETECTED
Opiates: NOT DETECTED
Tetrahydrocannabinol: NOT DETECTED

## 2020-07-14 LAB — PROTIME-INR
INR: 1 (ref 0.8–1.2)
Prothrombin Time: 12.5 seconds (ref 11.4–15.2)

## 2020-07-14 LAB — CBG MONITORING, ED
Glucose-Capillary: 173 mg/dL — ABNORMAL HIGH (ref 70–99)
Glucose-Capillary: 164 mg/dL — ABNORMAL HIGH (ref 70–99)

## 2020-07-14 LAB — APTT: aPTT: 26 seconds (ref 24–36)

## 2020-07-14 MED ORDER — IOHEXOL 350 MG/ML SOLN
100.0000 mL | Freq: Once | INTRAVENOUS | Status: AC | PRN
  Administered 2020-07-14: 100 mL via INTRAVENOUS

## 2020-07-14 MED ORDER — SODIUM CHLORIDE 0.9 % IV BOLUS
1000.0000 mL | Freq: Once | INTRAVENOUS | Status: AC
  Administered 2020-07-14: 1000 mL via INTRAVENOUS

## 2020-07-14 NOTE — ED Triage Notes (Signed)
C/o right arm numbness and " trouble speaking and finding my words" x 45 mins, husband states  Slurred speech x 1 hr ago and then cleared after 4 mins

## 2020-07-14 NOTE — ED Provider Notes (Signed)
[facility] Emergency Department Provider Note MRN:  [id number]  Arrival date & time: 07/14/20     Chief Complaint   Aphasia   History of Present Illness   Kristine Gray is a 69 y.o. year-old female with a history of hypertension, diabetes presenting to the ED with chief complaint of aphasia.  45 minutes prior to arrival, at 4:30 PM, patient was experiencing aphasia, word finding difficulties.  Was also experiencing numbness to the right arm.  Brought here quickly by husband.  Has been feeling generally unwell with malaise and mild headache for the past 2 days.  Not as active for the past 2 days.  Patient denies chest pain or shortness of breath, no abdominal pain, no neck or back pain, no numbness or weakness to the legs, no vision changes.  Still feels she is having trouble finding the right words.  Review of Systems  A complete 10 system review of systems was obtained and all systems are negative except as noted in the HPI and PMH.   Patient's Health History    Past Medical History:  Diagnosis Date  . Anemia   . Arthritis 04-02-12   arthritis lower back and spurs  . Diabetes mellitus 04-02-12   oral med and insulin used  . Diverticulosis of colon 04-02-12   hx. of, no problems  . H/O nephrostomy 04-02-12   03-19-12 tube placed and remains lt. flank.  . Hypertension   . Kidney calculi 04-02-12   hx. kidney stones-left, past hx. kidney stones  . Obesity     Past Surgical History:  Procedure Laterality Date  . CESAREAN SECTION  04-02-12   '86  . CHOLECYSTECTOMY    . DILATION AND CURETTAGE OF UTERUS  04-02-12  . iriodotomy  04-02-12   bil. eyes  . NEPHROLITHOTOMY  04/09/2012   Procedure: NEPHROLITHOTOMY PERCUTANEOUS;  Surgeon: Fredricka Bonine, MD;  Location: [facility] ORS;  Service: Urology;  Laterality: Left;  . TONSILLECTOMY    . TUBAL LIGATION  04-02-12    '86    Family History  Problem Relation Age of Onset  . Cancer Other   . Hypertension Other    . Hyperlipidemia Other   . Obesity Other   . Heart attack Other   . Breast cancer Maternal Aunt        in mid 63's    Social History   Socioeconomic History  . Marital status: Married    Spouse name: Not on file  . Number of children: Not on file  . Years of education: Not on file  . Highest education level: Not on file  Occupational History  . Not on file  Tobacco Use  . Smoking status: Never Smoker  . Smokeless tobacco: Never Used  Substance and Sexual Activity  . Alcohol use: No  . Drug use: No  . Sexual activity: Yes  Other Topics Concern  . Not on file  Social History Narrative  . Not on file   Social Determinants of Health   Financial Resource Strain:   . Difficulty of Paying Living Expenses: Not on file  Food Insecurity:   . Worried About Charity fundraiser in the Last Year: Not on file  . Ran Out of Food in the Last Year: Not on file  Transportation Needs:   . Lack of Transportation (Medical): Not on file  . Lack of Transportation (Non-Medical): Not on file  Physical Activity:   . Days of Exercise per Week:  Not on file  . Minutes of Exercise per Session: Not on file  Stress:   . Feeling of Stress : Not on file  Social Connections:   . Frequency of Communication with Friends and Family: Not on file  . Frequency of Social Gatherings with Friends and Family: Not on file  . Attends Religious Services: Not on file  . Active Member of Clubs or Organizations: Not on file  . Attends Archivist Meetings: Not on file  . Marital Status: Not on file  Intimate Partner Violence:   . Fear of Current or Ex-Partner: Not on file  . Emotionally Abused: Not on file  . Physically Abused: Not on file  . Sexually Abused: Not on file     Physical Exam   Vitals:   07/14/20 1820 07/14/20 1830  BP: 139/75 (!) 148/79  Pulse: 94 93  Resp: 13 16  Temp:    SpO2: 98% 99%    CONSTITUTIONAL: Well-appearing, NAD NEURO:  Alert and oriented x 3, normal and  symmetric strength and sensation, normal coordination, mild word hesitation and word finding problems EYES:  eyes equal and reactive ENT/NECK:  no LAD, no JVD CARDIO: Regular rate, well-perfused, normal S1 and S2 PULM:  CTAB no wheezing or rhonchi GI/GU:  normal bowel sounds, non-distended, non-tender MSK/SPINE:  No gross deformities, no edema SKIN:  no rash, atraumatic PSYCH:  Appropriate speech and behavior  *Additional and/or pertinent findings included in MDM below  Diagnostic and Interventional Summary    EKG Interpretation  Date/Time:  Wednesday July 14 2020 17:41:55 EDT Ventricular Rate:  105 PR Interval:    QRS Duration: 96 QT Interval:  300 QTC Calculation: 397 R Axis:   53 Text Interpretation: Sinus tachycardia Nonspecific repol abnormality, diffuse leads Baseline wander in lead(s) I III aVL Confirmed by Gerlene Fee [phone]) on 07/14/2020 6:11:41 PM      Labs Reviewed  CBC - Abnormal; Notable for the following components:      Result Value   WBC 12.8 (*)    Hemoglobin 15.4 (*)    All other components within normal limits  DIFFERENTIAL - Abnormal; Notable for the following components:   Lymphs Abs 5.5 (*)    Monocytes Absolute 1.3 (*)    All other components within normal limits  COMPREHENSIVE METABOLIC PANEL - Abnormal; Notable for the following components:   Sodium 128 (*)    Chloride 89 (*)    Glucose, Bld 167 (*)    Creatinine, Ser 1.61 (*)    AST 42 (*)    GFR, Estimated 32 (*)    All other components within normal limits  CBG MONITORING, ED - Abnormal; Notable for the following components:   Glucose-Capillary 173 (*)    All other components within normal limits  RESPIRATORY PANEL BY RT PCR (FLU A&B, COVID)  ETHANOL  PROTIME-INR  APTT  RAPID URINE DRUG SCREEN, HOSP PERFORMED  URINALYSIS, ROUTINE W REFLEX MICROSCOPIC    CT Angio Head W or Wo Contrast  Final Result    CT Angio Neck W and/or Wo Contrast  Final Result    CT HEAD CODE STROKE  WO CONTRAST`  Final Result      Medications  iohexol (OMNIPAQUE) 350 MG/ML injection 100 mL (100 mLs Intravenous Contrast Given 07/14/20 1736)  sodium chloride 0.9 % bolus 1,000 mL (1,000 mLs Intravenous New Bag/Given 07/14/20 1846)     Procedures  /  Critical Care .Critical Care Performed by: Maudie Flakes, MD Authorized by:  Maudie Flakes, MD   Critical care provider statement:    Critical care time (minutes):  45   Critical care was necessary to treat or prevent imminent or life-threatening deterioration of the following conditions:  CNS failure or compromise (Concern for acute ischemic stroke.)   Critical care was time spent personally by me on the following activities:  Discussions with consultants, evaluation of patient's response to treatment, examination of patient, ordering and performing treatments and interventions, ordering and review of laboratory studies, ordering and review of radiographic studies, pulse oximetry, re-evaluation of patient's condition, obtaining history from patient or surrogate and review of old charts    ED Course and Medical Decision Making  I have reviewed the triage vital signs, the nursing notes, and pertinent available records from the EMR.  Listed above are laboratory and imaging tests that I personally ordered, reviewed, and interpreted and then considered in my medical decision making (see below for details).  Concern for acute ischemic stroke, given the continued speech issues and last known normal only 45 minutes ago, code stroke initiated.  Awaiting teleneurology evaluation.     Patient's exam is improving, speech seems to be back to baseline, Dr. Cheral Marker of neurology recommending [facility] admission for TIA work-up.  Barth Kirks. [place] Small, [place] [email or web address]  Final Clinical Impressions(s) / ED Diagnoses     ICD-10-CM   1. TIA (transient ischemic attack)  G45.9     ED  Discharge Orders    None       Discharge Instructions Discussed with and Provided to Patient:   Discharge Instructions   None       Maudie Flakes, MD 07/14/20 1900

## 2020-07-14 NOTE — Consult Note (Signed)
[facility] TeleNeurology Consult Services    Date of Service:  07/14/20    Impression: TIA    Comments/Sign-Out: 69 year old female with acute onset today of expressive aphasia and right hand numbness. LKN was 1630. Symptoms first noticed at 1635. She was brought to [facility] where a Code Stroke was called. No aphasia or right sided weakness on exam. NIHSS 1 for mild RLE sensory numbness; latter finding could be due to a stroke but other differential diagnostic possibilities were also a consideration. Decision was made not to proceed with tPA as risks of morbidity/mortality from hemorrhage were felt to significantly outweigh potential benefits. The patient expressed understanding and agreement with the plan.    Metrics: Last Known Well: 1630  Symptoms: As per HPI. Patient was not deemed a candidate for thrombolytic based upon the following: Resolution of presenting symptoms. Risks of morbidity/mortality from hemorrhage were felt to significantly outweigh potential benefits.   CT head was reviewed.  1. No acute intracranial infarct or other abnormality. 2. ASPECTS is 10. 3. Moderately advanced chronic microvascular ischemic disease.  CTA of head and neck:  1. Negative CTA for emergent large vessel occlusion. 2. Bulky calcified atheromatous plaque about the carotid bifurcations/proximal ICAs with associated stenoses of up to 70% bilaterally. 3. Moderate short-segment proximal right P2 stenosis. 4. Fetal type origin of the left PCA.   ED Physician notified of diagnostic impression and management plan at: 6:25 PM    Recommendations:     .  Activate Stroke Protocol Admission/Order Set     .  Stroke/Telemetry Floor. Will need transfer to [facility].      Marland Kitchen  Neuro Checks     .  Increase ASA to 325 mg po qd. Stroke Team at [facility] to assess for possible switch to Plavix versus DAPT.      .  Stroke Team to consider initiation of atorvastatin 40 MG qhs. Will need baseline CK level.     Marland Kitchen  MRI  Brain without contrast to assess for stroke     .  Please allow for permissive HTN for 24 hours. Can use Labetalol 200 MG po q6h prn for    SBP>220 or DBP>120 (goal 15% reduction in first 24 hours)     .  Maximize blood glucose control with insulin coverage (goal FS 60-180)     .  Please maintain euthermia. Tylenol prn for temp >100.4     .  Normal Saline IV at 75 mL/hr     .  Transthoracic Echo (if not recently done)     .  Telemetry monitoring     .  Lipid Profile, A1C     .  PT/OT, Speech/Swallow evaluation     .  Dysphagia screen before starting PO diet     .  SCDs for DVT Prophylaxis     .  Routine Consultation with [facility] for Follow up Care   Sign Out:     .  Discussed with Emergency Department Provider       ------------------------------------------------------------------------------   History of Present Illness: 70 year old female with a PMHx of obesity, renal calculi, HTN, diverticulosis, DM, arthritis and anemia who presented to the [facility] ED today with her husband to assess acute onset of aphasia. LKN was 1630. Symptoms first noticed at  [facility] was 1630. Symptoms first noticed at 1635 when she experienced acute onset of right hand sensory numbness. She then developed difficulty speaking described as slurred speech in conjunction with "difficulty  getting the words out". This started to resolve after abut 5-10 minutes, but she then had some circumlocutory speech per husband. He decided to bring her to the ED where a Code Stroke was initiated.     Past Medical History:     . see hpi   Social History: Drug Use: None   Family History:  Reviewed in Epic  ROS: No CP or SOB. Has had fever and chills starting about 2 days ago, in conjunction with a mild headache. Also has had some nausea and diarrhea recently. Denies difficulty ambulating. No facial droop. No vision loss.    Anticoagulant use:  No    Antiplatelet use: Aspirin 162 mg po qd     Examination: BP(150/73),  Pulse(97), Blood Glucose(173) 1A: Level of Consciousness - 0 1B: Ask Month and Age - 0 1C: Blink Eyes & Squeeze Hands - 0 2: Test Horizontal Extraocular Movements - 0 3: Test Visual Fields - 0 4: Test Facial Palsy (Use Grimace if Obtunded) - 0 5A: Test Left Arm Motor Drift - 0 5B: Test Right Arm Motor Drift - 0 6A: Test Left Leg Motor Drift - 0 6B: Test Right Leg Motor Drift - 0           7: Test Limb Ataxia (FNF/Heel-Shin) - 0 8: Test Sensation -  1 9: Test Language/Aphasia - Severe Aphasia: 0 10: Test Dysarthria - Severe Dysarthria: 0 11: Test Extinction/Inattention - Extinction to bilateral simultaneous stimulation 0   NIHSS Score: 1   Patient/Family was informed the Neurology Consult would occur via TeleHealth consult by way of interactive audio and video telecommunications and consented to receiving care in this manner.   Patient is being evaluated for possible acute neurologic impairment and high pretest probability of imminent or life-threatening deterioration. I spent total of 35 minutes providing care to this patient, including time for face to face visit via telemedicine, review of medical records, imaging studies and discussion of findings with providers, the patient and/or family.   Electronically signed: Dr. Kerney Elbe

## 2020-07-14 NOTE — Progress Notes (Signed)
Code Stroke Times  2393 ER called  1726 Pt arrived Delay getting order from ER- Rad staff entered ordered 1730 Images sent  1731 Exam completed, GR called

## 2020-07-14 NOTE — ED Notes (Signed)
ED Provider at bedside. 

## 2020-07-14 NOTE — ED Notes (Signed)
Assisted Tele MD Neurologist with exam. From [phone]

## 2020-07-14 NOTE — ED Notes (Signed)
Patient transported to CT 

## 2020-07-15 ENCOUNTER — Observation Stay (HOSPITAL_COMMUNITY): Payer: PPO

## 2020-07-15 DIAGNOSIS — E1149 Type 2 diabetes mellitus with other diabetic neurological complication: Secondary | ICD-10-CM

## 2020-07-15 DIAGNOSIS — Z880 Allergy status to penicillin: Secondary | ICD-10-CM | POA: Diagnosis not present

## 2020-07-15 DIAGNOSIS — E782 Mixed hyperlipidemia: Secondary | ICD-10-CM | POA: Diagnosis not present

## 2020-07-15 DIAGNOSIS — I6782 Cerebral ischemia: Secondary | ICD-10-CM | POA: Diagnosis not present

## 2020-07-15 DIAGNOSIS — R4701 Aphasia: Secondary | ICD-10-CM | POA: Diagnosis present

## 2020-07-15 DIAGNOSIS — Z8249 Family history of ischemic heart disease and other diseases of the circulatory system: Secondary | ICD-10-CM | POA: Diagnosis not present

## 2020-07-15 DIAGNOSIS — E11649 Type 2 diabetes mellitus with hypoglycemia without coma: Secondary | ICD-10-CM | POA: Diagnosis not present

## 2020-07-15 DIAGNOSIS — Z794 Long term (current) use of insulin: Secondary | ICD-10-CM

## 2020-07-15 DIAGNOSIS — Z7984 Long term (current) use of oral hypoglycemic drugs: Secondary | ICD-10-CM | POA: Diagnosis not present

## 2020-07-15 DIAGNOSIS — R112 Nausea with vomiting, unspecified: Secondary | ICD-10-CM | POA: Diagnosis present

## 2020-07-15 DIAGNOSIS — N189 Chronic kidney disease, unspecified: Secondary | ICD-10-CM | POA: Diagnosis present

## 2020-07-15 DIAGNOSIS — I10 Essential (primary) hypertension: Secondary | ICD-10-CM

## 2020-07-15 DIAGNOSIS — G459 Transient cerebral ischemic attack, unspecified: Secondary | ICD-10-CM

## 2020-07-15 DIAGNOSIS — Z79899 Other long term (current) drug therapy: Secondary | ICD-10-CM | POA: Diagnosis not present

## 2020-07-15 DIAGNOSIS — I6522 Occlusion and stenosis of left carotid artery: Secondary | ICD-10-CM | POA: Diagnosis present

## 2020-07-15 DIAGNOSIS — Z803 Family history of malignant neoplasm of breast: Secondary | ICD-10-CM | POA: Diagnosis not present

## 2020-07-15 DIAGNOSIS — Z7982 Long term (current) use of aspirin: Secondary | ICD-10-CM | POA: Diagnosis not present

## 2020-07-15 DIAGNOSIS — K219 Gastro-esophageal reflux disease without esophagitis: Secondary | ICD-10-CM | POA: Diagnosis present

## 2020-07-15 DIAGNOSIS — Z8719 Personal history of other diseases of the digestive system: Secondary | ICD-10-CM | POA: Diagnosis not present

## 2020-07-15 DIAGNOSIS — Z20822 Contact with and (suspected) exposure to covid-19: Secondary | ICD-10-CM | POA: Diagnosis present

## 2020-07-15 DIAGNOSIS — Z888 Allergy status to other drugs, medicaments and biological substances status: Secondary | ICD-10-CM | POA: Diagnosis not present

## 2020-07-15 DIAGNOSIS — Z87442 Personal history of urinary calculi: Secondary | ICD-10-CM | POA: Diagnosis not present

## 2020-07-15 DIAGNOSIS — E7841 Elevated Lipoprotein(a): Secondary | ICD-10-CM | POA: Diagnosis not present

## 2020-07-15 DIAGNOSIS — D72829 Elevated white blood cell count, unspecified: Secondary | ICD-10-CM | POA: Diagnosis present

## 2020-07-15 DIAGNOSIS — Z6841 Body Mass Index (BMI) 40.0 and over, adult: Secondary | ICD-10-CM | POA: Diagnosis not present

## 2020-07-15 DIAGNOSIS — E876 Hypokalemia: Secondary | ICD-10-CM | POA: Diagnosis not present

## 2020-07-15 DIAGNOSIS — R29818 Other symptoms and signs involving the nervous system: Secondary | ICD-10-CM | POA: Diagnosis not present

## 2020-07-15 DIAGNOSIS — G9389 Other specified disorders of brain: Secondary | ICD-10-CM | POA: Diagnosis not present

## 2020-07-15 DIAGNOSIS — R9082 White matter disease, unspecified: Secondary | ICD-10-CM | POA: Diagnosis not present

## 2020-07-15 DIAGNOSIS — N179 Acute kidney failure, unspecified: Secondary | ICD-10-CM | POA: Diagnosis present

## 2020-07-15 DIAGNOSIS — E785 Hyperlipidemia, unspecified: Secondary | ICD-10-CM | POA: Diagnosis present

## 2020-07-15 LAB — LIPID PANEL
Cholesterol: 203 mg/dL — ABNORMAL HIGH (ref 0–200)
HDL: 52 mg/dL (ref >40)
LDL Cholesterol: 123 mg/dL — ABNORMAL HIGH (ref 0–99)
Total CHOL/HDL Ratio: 3.9 RATIO
Triglycerides: 139 mg/dL (ref <150)
VLDL: 28 mg/dL (ref 0–40)

## 2020-07-15 LAB — GLUCOSE, CAPILLARY
Glucose-Capillary: 280 mg/dL — ABNORMAL HIGH (ref 70–99)
Glucose-Capillary: 305 mg/dL — ABNORMAL HIGH (ref 70–99)
Glucose-Capillary: 269 mg/dL — ABNORMAL HIGH (ref 70–99)
Glucose-Capillary: 252 mg/dL — ABNORMAL HIGH (ref 70–99)

## 2020-07-15 LAB — CK: Total CK: 178 U/L (ref 38–234)

## 2020-07-15 LAB — ECHOCARDIOGRAM COMPLETE
Area-P 1/2: 2.62 cm2
Height: 64 in
S' Lateral: 2.4 cm
Weight: 4437.42 oz

## 2020-07-15 LAB — HIV ANTIBODY (ROUTINE TESTING W REFLEX): HIV Screen 4th Generation wRfx: NONREACTIVE

## 2020-07-15 MED ORDER — COLCHICINE 0.6 MG PO TABS: 0.6000 mg | ORAL_TABLET | Freq: Every day | ORAL | Status: DC | PRN

## 2020-07-15 MED ORDER — SODIUM CHLORIDE 0.9 % IV SOLN: INTRAVENOUS | Status: DC

## 2020-07-15 MED ORDER — VITAMIN E 45 MG (100 UNIT) PO CAPS
400.0000 [IU] | ORAL_CAPSULE | Freq: Every day | ORAL | Status: DC
  Administered 2020-07-15: 400 [IU] via ORAL
  Filled 2020-07-15 (×3): qty 4

## 2020-07-15 MED ORDER — INSULIN REGULAR HUMAN (CONC) 500 UNIT/ML ~~LOC~~ SOPN
80.0000 [IU] | PEN_INJECTOR | Freq: Three times a day (TID) | SUBCUTANEOUS | Status: DC
  Administered 2020-07-15 (×2): 80 [IU] via SUBCUTANEOUS
  Filled 2020-07-15 (×2): qty 3

## 2020-07-15 MED ORDER — ONDANSETRON HCL 4 MG PO TABS: 4.0000 mg | ORAL_TABLET | Freq: Three times a day (TID) | ORAL | Status: AC | PRN

## 2020-07-15 MED ORDER — STROKE: EARLY STAGES OF RECOVERY BOOK
Freq: Once | Status: AC
  Filled 2020-07-15: qty 1

## 2020-07-15 MED ORDER — SENNOSIDES-DOCUSATE SODIUM 8.6-50 MG PO TABS: 1.0000 | ORAL_TABLET | Freq: Every evening | ORAL | Status: DC | PRN

## 2020-07-15 MED ORDER — VITAMIN B-12 1000 MCG PO TABS
1000.0000 ug | ORAL_TABLET | Freq: Every day | ORAL | Status: DC
  Administered 2020-07-15 – 2020-07-17 (×2): 1000 ug via ORAL
  Filled 2020-07-15 (×2): qty 1

## 2020-07-15 MED ORDER — ACETAMINOPHEN 325 MG PO TABS: 650.0000 mg | ORAL_TABLET | Freq: Three times a day (TID) | ORAL | Status: DC | PRN

## 2020-07-15 MED ORDER — ASPIRIN EC 325 MG PO TBEC
325.0000 mg | DELAYED_RELEASE_TABLET | Freq: Every day | ORAL | Status: DC
  Administered 2020-07-15 – 2020-07-17 (×2): 325 mg via ORAL
  Filled 2020-07-15 (×2): qty 1

## 2020-07-15 MED ORDER — LINAGLIPTIN 5 MG PO TABS: 5.0000 mg | ORAL_TABLET | Freq: Every day | ORAL | Status: DC

## 2020-07-15 MED ORDER — INSULIN REGULAR HUMAN (CONC) 500 UNIT/ML ~~LOC~~ SOLN
80.0000 [IU] | Freq: Three times a day (TID) | SUBCUTANEOUS | Status: DC
  Filled 2020-07-15: qty 20

## 2020-07-15 MED ORDER — INSULIN ASPART 100 UNIT/ML ~~LOC~~ SOLN: 0.0000 [IU] | Freq: Three times a day (TID) | SUBCUTANEOUS | Status: DC

## 2020-07-15 MED ORDER — ATORVASTATIN CALCIUM 10 MG PO TABS: 20.0000 mg | ORAL_TABLET | Freq: Every day | ORAL | Status: DC

## 2020-07-15 MED ORDER — ASPIRIN EC 81 MG PO TBEC: 162.0000 mg | DELAYED_RELEASE_TABLET | Freq: Every day | ORAL | Status: DC

## 2020-07-15 MED ORDER — CLOPIDOGREL BISULFATE 75 MG PO TABS
75.0000 mg | ORAL_TABLET | Freq: Every day | ORAL | Status: DC
  Administered 2020-07-17: 75 mg via ORAL
  Filled 2020-07-15: qty 1

## 2020-07-15 MED ORDER — FOLIC ACID 1 MG PO TABS
1.0000 mg | ORAL_TABLET | Freq: Every day | ORAL | Status: DC
  Administered 2020-07-15 – 2020-07-17 (×2): 1 mg via ORAL
  Filled 2020-07-15 (×2): qty 1

## 2020-07-15 MED ORDER — ATORVASTATIN CALCIUM 80 MG PO TABS
80.0000 mg | ORAL_TABLET | Freq: Every day | ORAL | Status: DC
  Administered 2020-07-15 – 2020-07-17 (×2): 80 mg via ORAL
  Filled 2020-07-15 (×2): qty 1

## 2020-07-15 MED ORDER — TRAMADOL HCL 50 MG PO TABS
50.0000 mg | ORAL_TABLET | Freq: Four times a day (QID) | ORAL | Status: DC | PRN
  Administered 2020-07-16 (×2): 50 mg via ORAL
  Filled 2020-07-15 (×2): qty 1

## 2020-07-15 MED ORDER — CLOPIDOGREL BISULFATE 75 MG PO TABS
300.0000 mg | ORAL_TABLET | Freq: Once | ORAL | Status: AC
  Administered 2020-07-15: 300 mg via ORAL
  Filled 2020-07-15: qty 4

## 2020-07-15 MED ORDER — LABETALOL HCL 200 MG PO TABS: 200.0000 mg | ORAL_TABLET | Freq: Four times a day (QID) | ORAL | Status: DC | PRN

## 2020-07-15 MED ORDER — ALLOPURINOL 100 MG PO TABS
100.0000 mg | ORAL_TABLET | Freq: Every day | ORAL | Status: DC
  Administered 2020-07-15 – 2020-07-17 (×2): 100 mg via ORAL
  Filled 2020-07-15 (×2): qty 1

## 2020-07-15 MED ORDER — INSULIN REGULAR HUMAN (CONC) 500 UNIT/ML ~~LOC~~ SOLN: 5.0000 [IU] | Freq: Two times a day (BID) | SUBCUTANEOUS | Status: DC

## 2020-07-15 MED ORDER — CALCIUM POLYCARBOPHIL 625 MG PO TABS
625.0000 mg | ORAL_TABLET | Freq: Every day | ORAL | Status: DC
  Administered 2020-07-15: 625 mg via ORAL
  Filled 2020-07-15 (×3): qty 1

## 2020-07-15 NOTE — H&P (View-Only) (Signed)
Hospital Consult    Reason for Consult:  L brain TIA 2/2 carotid stenosis Referring Physician:  Dr. Avon Gully MRN #:  [id number]  History of Present Illness: This is a 69 y.o. female presented last pm with expressive aphasia and right hand numbness. Symptoms have resolved.  Now on aspirin, plavix and statin was not on plavix at home.  She has never had previous stroke, TIA or amaurosis.  She has no known history of vascular disease.  She is a lifelong non-smoker.  She is diabetic with hypertension hyperlipidemia.  Past Medical History:  Diagnosis Date  . Anemia   . Arthritis 04-02-12   arthritis lower back and spurs  . Diabetes mellitus 04-02-12   oral med and insulin used  . Diverticulosis of colon 04-02-12   hx. of, no problems  . H/O nephrostomy 04-02-12   03-19-12 tube placed and remains lt. flank.  . Hypertension   . Kidney calculi 04-02-12   hx. kidney stones-left, past hx. kidney stones  . Obesity     Past Surgical History:  Procedure Laterality Date  . CESAREAN SECTION  04-02-12   '86  . CHOLECYSTECTOMY    . DILATION AND CURETTAGE OF UTERUS  04-02-12  . iriodotomy  04-02-12   bil. eyes  . NEPHROLITHOTOMY  04/09/2012   Procedure: NEPHROLITHOTOMY PERCUTANEOUS;  Surgeon: Fredricka Bonine, MD;  Location: [facility]S;  Service: Urology;  Laterality: Left;  . TONSILLECTOMY    . TUBAL LIGATION  04-02-12    '86    Allergies  Allergen Reactions  . Penicillins Hives, Itching and Swelling  . Valsartan Other (See Comments)    Bradycardia    Prior to Admission medications   Medication Sig Start Date End Date Taking? Authorizing Provider  acetaminophen (TYLENOL) 650 MG CR tablet Take 650 mg by mouth every 8 (eight) hours as needed for pain.    [provider]  allopurinol (ZYLOPRIM) 100 MG tablet Take 100 mg by mouth 2 (two) times daily.     [provider]  aspirin EC 81 MG tablet Take 2 tablets (162 mg total) by mouth daily with breakfast. Patient taking  differently: Take 162 mg by mouth at bedtime.  04/14/12   Festus Aloe, MD  atorvastatin (LIPITOR) 20 MG tablet Take 20 mg by mouth daily.    [provider]  colchicine 0.6 MG tablet Take 0.6 mg by mouth daily as needed. As needed for gout flare     [provider]  cyclobenzaprine (FLEXERIL) 10 MG tablet Take 10 mg by mouth daily.    [provider]  diphenhydramine-acetaminophen (TYLENOL PM) 25-500 MG TABS Take 1-2 tablets by mouth at bedtime as needed.    [provider]  fluticasone (FLONASE) 50 MCG/ACT nasal spray Place 1 spray into the nose daily as needed. For congestion    [provider]  folic acid (FOLVITE) 093 MCG tablet Take 400 mcg by mouth daily.    [provider]  gabapentin (NEURONTIN) 300 MG capsule Take 300 mg by mouth daily.    [provider]  hydrochlorothiazide (HYDRODIURIL) 25 MG tablet Take 25 mg by mouth daily.    [provider]  insulin regular human CONCENTRATED (HUMULIN R) 500 UNIT/ML SOLN injection Inject 40-120 Units into the skin 3 (three) times daily with meals. 120 units when she wakes up ~ 12 noon , 70 units at 6 PM and 40-50 units at bedtime (0200-0400 AM)    [provider]  naphazoline-pheniramine (NAPHCON-A) 0.025-0.3 %  ophthalmic solution 1 drop 4 (four) times daily as needed for eye irritation.    [provider]  nebivolol (BYSTOLIC) 10 MG tablet Take 10 mg by mouth 2 (two) times daily.    [provider]  polycarbophil (FIBERCON) 625 MG tablet Take 625 mg by mouth daily.    [provider]  sitaGLIPtin (JANUVIA) 50 MG tablet Take 50 mg by mouth daily.    [provider]  traMADol (ULTRAM) 50 MG tablet Take by mouth every 6 (six) hours as needed.    [provider]  vitamin B-12 (CYANOCOBALAMIN) 1000 MCG tablet Take 1,000 mcg by mouth daily.    [provider]  vitamin E 400 UNIT capsule Take 400 Units by mouth daily.     [provider]    Social History   Socioeconomic History  . Marital status: Married    Spouse name: Not on file  . Number of children: Not on file  . Years of education: Not on file  . Highest education level: Not on file  Occupational History  . Not on file  Tobacco Use  . Smoking status: Never Smoker  . Smokeless tobacco: Never Used  Substance and Sexual Activity  . Alcohol use: No  . Drug use: No  . Sexual activity: Yes  Other Topics Concern  . Not on file  Social History Narrative  . Not on file   Social Determinants of Health   Financial Resource Strain:   . Difficulty of Paying Living Expenses: Not on file  Food Insecurity:   . Worried About Charity fundraiser in the Last Year: Not on file  . Ran Out of Food in the Last Year: Not on file  Transportation Needs:   . Lack of Transportation (Medical): Not on file  . Lack of Transportation (Non-Medical): Not on file  Physical Activity:   . Days of Exercise per Week: Not on file  . Minutes of Exercise per Session: Not on file  Stress:   . Feeling of Stress : Not on file  Social Connections:   . Frequency of Communication with Friends and Family: Not on file  . Frequency of Social Gatherings with Friends and Family: Not on file  . Attends Religious Services: Not on file  . Active Member of Clubs or Organizations: Not on file  . Attends Archivist Meetings: Not on file  . Marital Status: Not on file  Intimate Partner Violence:   . Fear of Current or Ex-Partner: Not on file  . Emotionally Abused: Not on file  . Physically Abused: Not on file  . Sexually Abused: Not on file    Family History  Problem Relation Age of Onset  . Cancer Other   . Hypertension Other   . Hyperlipidemia Other   . Obesity Other   . Heart attack Other   . Breast cancer Maternal Aunt        in mid 64's    ROS: Cardiovascular: []  chest pain/pressure []  palpitations []  SOB lying flat []  DOE []  pain in legs  while walking []  pain in legs at rest []  pain in legs at night []  non-healing ulcers []  hx of DVT []  swelling in legs  Pulmonary: []  productive cough []  asthma/wheezing []  home O2  Neurologic: [x]  weakness in []  arms []  legs [x]  numbness in []  arms []  legs []  hx of CVA []  mini stroke [xdifficulty speaking or slurred speech []  temporary loss of vision in one eye []   dizziness  Hematologic: []  hx of cancer []  bleeding problems []  problems with blood clotting easily  Endocrine:   [x]  diabetes []  thyroid disease  GI []  vomiting blood []  blood in stool  GU: []  CKD/renal failure []  HD--[]  M/W/F or []  T/T/S []  burning with urination []  blood in urine  Psychiatric: []  anxiety []  depression  Musculoskeletal: []  arthritis [x]  finger numbness  Integumentary: []  rashes []  ulcers  Constitutional: []  fever []  chills   Physical Examination  Vitals:   07/15/20 0540 07/15/20 0726  BP: (!) 141/76 (!) 111/51  Pulse: 84 75  Resp: 20 20  Temp: 98 F (36.7 C) 97.9 F (36.6 C)  SpO2: 95% 95%   Body mass index is 47.61 kg/m.  General:  WDWN in NAD Gait: Not observed HENT: WNL, normocephalic Pulmonary: normal non-labored breathing Cardiac: Palpable radial and popliteal pulses Abdomen:  soft, NT/ND, no masses Extremities: No clubbing cyanosis edema Musculoskeletal: no muscle wasting or atrophy  Neurologic: A&O X 3; Appropriate Affect ; SENSATION: normal; MOTOR FUNCTION:  moving all extremities equally. Speech is fluent/normal   CBC    Component Value Date/Time   WBC 12.8 (H) 07/14/2020 1635   RBC 4.98 07/14/2020 1635   HGB 15.4 (H) 07/14/2020 1635   HCT 44.5 07/14/2020 1635   PLT 255 07/14/2020 1635   MCV 89.4 07/14/2020 1635   MCH 30.9 07/14/2020 1635   MCHC 34.6 07/14/2020 1635   RDW 13.6 07/14/2020 1635   LYMPHSABS 5.5 (H) 07/14/2020 1635   MONOABS 1.3 (H) 07/14/2020 1635   EOSABS 0.1 07/14/2020 1635   BASOSABS 0.1 07/14/2020 1635    BMET      Component Value Date/Time   NA 128 (L) 07/14/2020 1635   K 3.5 07/14/2020 1635   CL 89 (L) 07/14/2020 1635   CO2 25 07/14/2020 1635   GLUCOSE 167 (H) 07/14/2020 1635   BUN 19 07/14/2020 1635   CREATININE 1.61 (H) 07/14/2020 1635   CALCIUM 9.5 07/14/2020 1635   GFRNONAA 32 (L) 07/14/2020 1635   GFRAA 39 (L) 04/10/2012 0415    COAGS: Lab Results  Component Value Date   INR 1.0 07/14/2020   INR 0.95 04/02/2012     Non-Invasive Vascular Imaging:   MRI IMPRESSION: No acute infarction, hemorrhage, or mass. Moderate chronic microvascular ischemic changes.   CTA neck/head IMPRESSION: 1. Negative CTA for emergent large vessel occlusion. 2. Bulky calcified atheromatous plaque about the carotid bifurcations/proximal ICAs with associated stenoses of up to 70% bilaterally. 3. Moderate short-segment proximal right P2 stenosis. 4. Fetal type origin of the left PCA.   ASSESSMENT/PLAN: This is a 69 y.o. female with symptomatic left carotid lesion.  Plan will be for left carotid endarterectomy tomorrow.  I discussed the alternative therapy of best medical treatment versus carotid stenting.  I do not think she is a candidate for carotid stent due to the high amount of calcium.  We discussed the risks as well as the benefits and she demonstrates good understanding.  Okay to continue aspirin Plavix.  N.p.o. past midnight.  Shalane Florendo C. Donzetta Matters, MD [facility] Office: [phone] Pager: [phone]

## 2020-07-15 NOTE — Evaluation (Signed)
Physical Therapy Evaluation Patient Details Name: Kristine Gray MRN: [id number] DOB: 1951/04/26 Today's Date: 07/15/2020   History of Present Illness  69 year old female with PMH significant for HTN, DM. Pt with acute onset of expressive aphasia and right hand numbness. CT, CTA, and MRI all negative.  Clinical Impression  Pt presents to PT at or near her functional baseline. Pt reports resolution of symptoms in her R hand, sensation and coordination intact. Pt is able to mobilize independently around the room and declines the need to negotiate stairs at this time. Pt requires no further acute PT services at this time and is encouraged to ambulate multiple times a day for the remainder of this hospitalization. No PT or DME needs at this time. Acute PT signing off.    Follow Up Recommendations No PT follow up    Equipment Recommendations  None recommended by PT    Recommendations for Other Services       Precautions / Restrictions Precautions Precautions: None Restrictions Weight Bearing Restrictions: No      Mobility  Bed Mobility Overal bed mobility:  (pt received in standing and left sitting at edge of bed)                  Transfers Overall transfer level: Independent Equipment used: None             General transfer comment: sit to stand independently  Ambulation/Gait Ambulation/Gait assistance: Independent Gait Distance (Feet): 100 Feet Assistive device: None Gait Pattern/deviations: Step-through pattern;WFL(Within Functional Limits) Gait velocity: functional Gait velocity interpretation: 1.31 - 2.62 ft/sec, indicative of limited community ambulator General Gait Details: pt with short step through gait, declining ambulation out of room but mobilizing around the hospital room independently  Stairs Stairs:  (pt declines the need for stair assessment)          Wheelchair Mobility    Modified Rankin (Stroke Patients Only) Modified Rankin  (Stroke Patients Only) Pre-Morbid Rankin Score: No symptoms Modified Rankin: No symptoms     Balance Overall balance assessment: Independent                                           Pertinent Vitals/Pain Pain Assessment: No/denies pain    Home Living Family/patient expects to be discharged to:: Private residence Living Arrangements: Spouse/significant other Available Help at Discharge: Family;Available 24 hours/day Type of Home: House Home Access: Stairs to enter Entrance Stairs-Rails: Can reach both Entrance Stairs-Number of Steps: 4 Home Layout: Multi-level Home Equipment:  (unsure, pt not utilizing any DME at baseline)      Prior Function Level of Independence: Independent               Hand Dominance        Extremity/Trunk Assessment   Upper Extremity Assessment Upper Extremity Assessment: Overall WFL for tasks assessed    Lower Extremity Assessment Lower Extremity Assessment: Overall WFL for tasks assessed    Cervical / Trunk Assessment Cervical / Trunk Assessment: Normal  Communication   Communication: No difficulties  Cognition Arousal/Alertness: Awake/alert Behavior During Therapy: WFL for tasks assessed/performed Overall Cognitive Status: Within Functional Limits for tasks assessed  General Comments General comments (skin integrity, edema, etc.): VSS, pt able to reach outside of BOS with biref period of single leg stance independently    Exercises     Assessment/Plan    PT Assessment Patent does not need any further PT services  PT Problem List         PT Treatment Interventions      PT Goals (Current goals can be found in the Care Plan section)       Frequency     Barriers to discharge        Co-evaluation               AM-PAC PT "6 Clicks" Mobility  Outcome Measure Help needed turning from your back to your side while in a flat bed without  using bedrails?: None Help needed moving from lying on your back to sitting on the side of a flat bed without using bedrails?: None Help needed moving to and from a bed to a chair (including a wheelchair)?: None Help needed standing up from a chair using your arms (e.g., wheelchair or bedside chair)?: None Help needed to walk in hospital room?: None Help needed climbing 3-5 steps with a railing? : None 6 Click Score: 24    End of Session   Activity Tolerance: Patient tolerated treatment well Patient left: in bed;with call bell/phone within reach Nurse Communication: Mobility status      Time: 1141-1152 PT Time Calculation (min) (ACUTE ONLY): 11 min   Charges:   PT Evaluation $PT Eval Low Complexity: [place], PT, DPT Acute Rehabilitation Pager: [phone]   Zenaida Niece 07/15/2020, 12:47 PM

## 2020-07-15 NOTE — Consult Note (Signed)
Stroke Neurology Consultation Note  Consult Requested by: [facility], seen by teleneuro at [facility] 07/14/2020 at 1819 Cheral Marker)  Reason for Consult: TIA  Consult Date:  07/15/20  History of Present Illness:  Kristine Gray is an 69 y.o. morbidly obese Caucasian female with PMH of morbid obesity, HTN, DM and amenia who presented to [place] 07/14/20 with transient acute R hand numbness and slurred speech and difficulty getting her words out. Lasted 5-10 mins, and resolved. Her husband brought her to the ED. She reported fever and chills 2 d prior w/ mild HA. Also w/ nausea and diarrhea recently. She takes aspirin 162 daily.    Date last known well: Date: 07/14/2020 Time last known well: Time: 16:30 tPA Given: No: risk outweighs benefit MRS:  0 NIHSS:  A on arrival to [place]   Past Medical History:  Diagnosis Date  . Anemia   . Arthritis 04-02-12   arthritis lower back and spurs  . Diabetes mellitus 04-02-12   oral med and insulin used  . Diverticulosis of colon 04-02-12   hx. of, no problems  . H/O nephrostomy 04-02-12   03-19-12 tube placed and remains lt. flank.  . Hypertension   . Kidney calculi 04-02-12   hx. kidney stones-left, past hx. kidney stones  . Obesity      Past Surgical History:  Procedure Laterality Date  . CESAREAN SECTION  04-02-12   '86  . CHOLECYSTECTOMY    . DILATION AND CURETTAGE OF UTERUS  04-02-12  . iriodotomy  04-02-12   bil. eyes  . NEPHROLITHOTOMY  04/09/2012   Procedure: NEPHROLITHOTOMY PERCUTANEOUS;  Surgeon: Fredricka Bonine, MD;  Location: [facility];  Service: Urology;  Laterality: Left;  . TONSILLECTOMY    . TUBAL LIGATION  04-02-12    '86    Family History  Problem Relation Age of Onset  . Cancer Other   . Hypertension Other   . Hyperlipidemia Other   . Obesity Other   . Heart attack Other   . Breast cancer Maternal Aunt        in mid 57's     Social History:  reports that she has never smoked. She has  never used smokeless tobacco. She reports that she does not drink alcohol and does not use drugs.  Review of Systems: A full ROS was attempted today and was able to be performed.  Systems assessed include - Constitutional, Eyes, HENT, Respiratory, Cardiovascular, Gastrointestinal, Genitourinary, Integument/breast, Hematologic/lymphatic, Musculoskeletal, Neurological, Behavioral/Psych, Endocrine, Allergic/Immunologic - with pertinent responses as per HPI.  Allergies:  Allergies  Allergen Reactions  . Penicillins Hives, Itching and Swelling  . Valsartan Other (See Comments)    Bradycardia     Medications: I have reviewed the patient's current medications.  Test Results: CBC:  Recent Labs  Lab 07/14/20 1635  WBC 12.8*  NEUTROABS 5.8  HGB 15.4*  HCT 44.5  MCV 89.4  PLT 518   Basic Metabolic Panel:  Recent Labs  Lab 07/14/20 1635  NA 128*  K 3.5  CL 89*  CO2 25  GLUCOSE 167*  BUN 19  CREATININE 1.61*  CALCIUM 9.5   Liver Function Tests: Recent Labs  Lab 07/14/20 1635  AST 42*  ALT 44  ALKPHOS 69  BILITOT 1.2  PROT 7.5  ALBUMIN 4.0   Coagulation Studies:  Recent Labs    07/14/20 1635  LABPROT 12.5  INR 1.0   Cardiac Enzymes:  Recent Labs  Lab  07/15/20 0242  CKTOTAL 178   BNP: Invalid input(s): POCBNP CBG:  Recent Labs  Lab 07/14/20 1721 07/14/20 2233  GLUCAP 173* 164*   Urinalysis:  Recent Labs  Lab 07/14/20 2158  COLORURINE YELLOW  LABSPEC <1.005*  PHURINE 6.0  GLUCOSEU 250*  HGBUR NEGATIVE  BILIRUBINUR NEGATIVE  KETONESUR NEGATIVE  PROTEINUR NEGATIVE  NITRITE NEGATIVE  LEUKOCYTESUR NEGATIVE   Microbiology:  Results for orders placed or performed during the hospital encounter of 07/14/20  Respiratory Panel by RT PCR (Flu A&B, Covid) - Nasopharyngeal Swab     Status: None   Collection Time: 07/14/20  6:23 PM   Specimen: Nasopharyngeal Swab  Result Value Ref Range Status   SARS Coronavirus 2 by RT PCR NEGATIVE NEGATIVE Final     Comment: (NOTE) SARS-CoV-2 target nucleic acids are NOT DETECTED.  The SARS-CoV-2 RNA is generally detectable in upper respiratoy specimens during the acute phase of infection. The lowest concentration of SARS-CoV-2 viral copies this assay can detect is 131 copies/mL. A negative result does not preclude SARS-Cov-2 infection and should not be used as the sole basis for treatment or other patient management decisions. A negative result may occur with  improper specimen collection/handling, submission of specimen other than nasopharyngeal swab, presence of viral mutation(s) within the areas targeted by this assay, and inadequate number of viral copies (<131 copies/mL). A negative result must be combined with clinical observations, patient history, and epidemiological information. The expected result is Negative.  Fact Sheet for Patients:  PinkCheek.be  Fact Sheet for Healthcare Providers:  GravelBags.it  This test is no t yet approved or cleared by the [place] FDA and  has been authorized for detection and/or diagnosis of SARS-CoV-2 by FDA under an Emergency Use Authorization (EUA). This EUA will remain  in effect (meaning this test can be used) for the duration of the COVID-19 declaration under Section 564(b)(1) of the Act, 21 U.S.C. section 360bbb-3(b)(1), unless the authorization is terminated or revoked sooner.     Influenza A by PCR NEGATIVE NEGATIVE Final   Influenza B by PCR NEGATIVE NEGATIVE Final    Comment: (NOTE) The Xpert Xpress SARS-CoV-2/FLU/RSV assay is intended as an aid in  the diagnosis of influenza from Nasopharyngeal swab specimens and  should not be used as a sole basis for treatment. Nasal washings and  aspirates are unacceptable for Xpert Xpress SARS-CoV-2/FLU/RSV  testing.  Fact Sheet for Patients: PinkCheek.be  Fact Sheet for Healthcare  Providers: GravelBags.it  This test is not yet approved or cleared by the [place] FDA and  has been authorized for detection and/or diagnosis of SARS-CoV-2 by  FDA under an Emergency Use Authorization (EUA). This EUA will remain  in effect (meaning this test can be used) for the duration of the  Covid-19 declaration under Section 564(b)(1) of the Act, 21  U.S.C. section 360bbb-3(b)(1), unless the authorization is  terminated or revoked. Performed at [facility], [address] [place], [place] [address]    Lipid Panel:     Component Value Date/Time   CHOL 203 (H) 07/15/2020 0242   TRIG 139 07/15/2020 0242   HDL 52 07/15/2020 0242   CHOLHDL 3.9 07/15/2020 0242   VLDL 28 07/15/2020 0242   LDLCALC 123 (H) 07/15/2020 0242   HgbA1c:  Lab Results  Component Value Date   HGBA1C 8.3 (H) 03/21/2012   Urine Drug Screen:     Component Value Date/Time   LABOPIA NONE DETECTED 07/14/2020 2158   COCAINSCRNUR NONE  DETECTED 07/14/2020 2158   LABBENZ NONE DETECTED 07/14/2020 2158   AMPHETMU NONE DETECTED 07/14/2020 2158   THCU NONE DETECTED 07/14/2020 2158   LABBARB NONE DETECTED 07/14/2020 2158    Alcohol Level:  Recent Labs  Lab 07/14/20 1635  ETH <10    CT Angio Head W or Wo Contrast  Result Date: 07/14/2020 CLINICAL DATA:  Initial evaluation for acute slurred speech. EXAM: CT ANGIOGRAPHY HEAD AND NECK TECHNIQUE: Multidetector CT imaging of the head and neck was performed using the standard protocol during bolus administration of intravenous contrast. Multiplanar CT image reconstructions and MIPs were obtained to evaluate the vascular anatomy. Carotid stenosis measurements (when applicable) are obtained utilizing NASCET criteria, using the distal internal carotid diameter as the denominator. CONTRAST:  131mL OMNIPAQUE IOHEXOL 350 MG/ML SOLN COMPARISON:  Prior CT from earlier same day. FINDINGS: CTA NECK FINDINGS Aortic arch: Visualized  aortic arch normal caliber with normal 3 vessel morphology. Mild atheromatous change about the arch itself. No hemodynamically significant stenosis seen about the origin of the great vessels. Right carotid system: Right CCA patent from its origin to the bifurcation without stenosis. Bulky calcified plaque about the right bifurcation/proximal right ICA with associated stenosis of up to approximately 70% by NASCET criteria. Right ICA patent distally to the skull base without stenosis, dissection or occlusion. Left carotid system: Left CCA patent from its origin to the bifurcation without stenosis. Bulky calcified plaque about the left bifurcation/proximal left ICA with associated stenosis of up to approximately 70% by NASCET criteria. Left ICA patent distally to the skull base without stenosis, dissection or occlusion. Vertebral arteries: Both vertebral arteries arise from the subclavian arteries. No proximal subclavian artery stenosis. Both vertebral arteries patent within the neck without stenosis, dissection or occlusion. Skeleton: No visible acute osseous abnormality. No discrete or worrisome osseous lesions. Advanced multilevel right-sided facet arthrosis noted throughout the cervical spine. Moderate cervical spondylosis present at C5-6 and C6-7. Other neck: No other acute soft tissue abnormality within the neck. No mass lesion or adenopathy. Upper chest: Scattered atelectatic changes noted within the visualized lungs. Visualized upper chest demonstrates no other acute finding. Review of the MIP images confirms the above findings CTA HEAD FINDINGS Anterior circulation: Petrous segments patent bilaterally. Diffuse calcified atheromatous plaque seen throughout the carotid siphons with no more than mild diffuse stenosis. A1 segments patent bilaterally. Normal anterior communicating artery complex. Anterior cerebral arteries patent to their distal aspects without stenosis. No M1 stenosis or occlusion. Normal MCA  bifurcations. Distal MCA branches well perfused and symmetric. Posterior circulation: Both vertebral arteries patent to the vertebrobasilar junction without stenosis. Both picas patent. Basilar patent to its distal aspect without stenosis. Superior cerebral arteries patent bilaterally. Right PCA supplied via the basilar. Fetal type origin of the left PCA. Short-segment moderate proximal right P2 stenosis noted (series 8, image 118). Both PCAs otherwise widely patent and well perfused to their distal aspects. Venous sinuses: Grossly patent allowing for timing the contrast bolus. Anatomic variants: Fetal type origin of the left PCA. No intracranial aneurysm. Review of the MIP images confirms the above findings IMPRESSION: 1. Negative CTA for emergent large vessel occlusion. 2. Bulky calcified atheromatous plaque about the carotid bifurcations/proximal ICAs with associated stenoses of up to 70% bilaterally. 3. Moderate short-segment proximal right P2 stenosis. 4. Fetal type origin of the left PCA. Critical Value/emergent results were called by telephone at the time of interpretation poor on 07/14/2020 at 6:02 pm to provider [facility] , who verbally acknowledged these results. Electronically  Signed   By: Jeannine Boga M.D.   On: 07/14/2020 18:07   CT Angio Neck W and/or Wo Contrast  Result Date: 07/14/2020 CLINICAL DATA:  Initial evaluation for acute slurred speech. EXAM: CT ANGIOGRAPHY HEAD AND NECK TECHNIQUE: Multidetector CT imaging of the head and neck was performed using the standard protocol during bolus administration of intravenous contrast. Multiplanar CT image reconstructions and MIPs were obtained to evaluate the vascular anatomy. Carotid stenosis measurements (when applicable) are obtained utilizing NASCET criteria, using the distal internal carotid diameter as the denominator. CONTRAST:  175mL OMNIPAQUE IOHEXOL 350 MG/ML SOLN COMPARISON:  Prior CT from earlier same day. FINDINGS: CTA NECK  FINDINGS Aortic arch: Visualized aortic arch normal caliber with normal 3 vessel morphology. Mild atheromatous change about the arch itself. No hemodynamically significant stenosis seen about the origin of the great vessels. Right carotid system: Right CCA patent from its origin to the bifurcation without stenosis. Bulky calcified plaque about the right bifurcation/proximal right ICA with associated stenosis of up to approximately 70% by NASCET criteria. Right ICA patent distally to the skull base without stenosis, dissection or occlusion. Left carotid system: Left CCA patent from its origin to the bifurcation without stenosis. Bulky calcified plaque about the left bifurcation/proximal left ICA with associated stenosis of up to approximately 70% by NASCET criteria. Left ICA patent distally to the skull base without stenosis, dissection or occlusion. Vertebral arteries: Both vertebral arteries arise from the subclavian arteries. No proximal subclavian artery stenosis. Both vertebral arteries patent within the neck without stenosis, dissection or occlusion. Skeleton: No visible acute osseous abnormality. No discrete or worrisome osseous lesions. Advanced multilevel right-sided facet arthrosis noted throughout the cervical spine. Moderate cervical spondylosis present at C5-6 and C6-7. Other neck: No other acute soft tissue abnormality within the neck. No mass lesion or adenopathy. Upper chest: Scattered atelectatic changes noted within the visualized lungs. Visualized upper chest demonstrates no other acute finding. Review of the MIP images confirms the above findings CTA HEAD FINDINGS Anterior circulation: Petrous segments patent bilaterally. Diffuse calcified atheromatous plaque seen throughout the carotid siphons with no more than mild diffuse stenosis. A1 segments patent bilaterally. Normal anterior communicating artery complex. Anterior cerebral arteries patent to their distal aspects without stenosis. No M1  stenosis or occlusion. Normal MCA bifurcations. Distal MCA branches well perfused and symmetric. Posterior circulation: Both vertebral arteries patent to the vertebrobasilar junction without stenosis. Both picas patent. Basilar patent to its distal aspect without stenosis. Superior cerebral arteries patent bilaterally. Right PCA supplied via the basilar. Fetal type origin of the left PCA. Short-segment moderate proximal right P2 stenosis noted (series 8, image 118). Both PCAs otherwise widely patent and well perfused to their distal aspects. Venous sinuses: Grossly patent allowing for timing the contrast bolus. Anatomic variants: Fetal type origin of the left PCA. No intracranial aneurysm. Review of the MIP images confirms the above findings IMPRESSION: 1. Negative CTA for emergent large vessel occlusion. 2. Bulky calcified atheromatous plaque about the carotid bifurcations/proximal ICAs with associated stenoses of up to 70% bilaterally. 3. Moderate short-segment proximal right P2 stenosis. 4. Fetal type origin of the left PCA. Critical Value/emergent results were called by telephone at the time of interpretation poor on 07/14/2020 at 6:02 pm to provider [facility] , who verbally acknowledged these results. Electronically Signed   By: Jeannine Boga M.D.   On: 07/14/2020 18:07   MR BRAIN WO CONTRAST  Result Date: 07/15/2020 CLINICAL DATA:  Abnormal speech, code stroke follow-up EXAM: MRI HEAD  WITHOUT CONTRAST TECHNIQUE: Multiplanar, multiecho pulse sequences of the brain and surrounding structures were obtained without intravenous contrast. COMPARISON:  None. FINDINGS: Brain: There is no acute infarction or intracranial hemorrhage. There is no intracranial mass, mass effect, or edema. There is no hydrocephalus or extra-axial fluid collection. Prominence of ventricles and sulci reflects minor generalized parenchymal volume loss. Patchy and confluent areas of T2 hyperintensity in the supratentorial white  matter are nonspecific but may reflect moderate chronic microvascular ischemic changes. Vascular: Major vessel flow voids at the skull base are preserved. Skull and upper cervical spine: Normal marrow signal is preserved. Sinuses/Orbits: Paranasal sinuses are aerated. Orbits are unremarkable. Other: Sella is unremarkable.  Mastoid air cells are clear. IMPRESSION: No acute infarction, hemorrhage, or mass. Moderate chronic microvascular ischemic changes. Electronically Signed   By: Macy Mis M.D.   On: 07/15/2020 10:10   CT HEAD CODE STROKE WO CONTRAST`  Result Date: 07/14/2020 CLINICAL DATA:  Code stroke. Initial evaluation for acute right arm numbness, speech difficulty. EXAM: CT HEAD WITHOUT CONTRAST TECHNIQUE: Contiguous axial images were obtained from the base of the skull through the vertex without intravenous contrast. COMPARISON:  Prior CT from 03/16/2010. FINDINGS: Brain: Generalized age-related cerebral atrophy. Patchy and confluent hypodensity within the periventricular deep white matter both cerebral hemispheres most consistent with chronic small vessel ischemic disease, moderate in nature. No acute intracranial hemorrhage. No acute large vessel territory infarct. No mass lesion or midline shift. No hydrocephalus or extra-axial fluid collection. Vascular: No hyperdense vessel. Calcified atherosclerosis at the skull base. Skull: Scalp soft tissues and calvarium within normal limits. Sinuses/Orbits: Globes and orbital soft tissues within normal limits. Paranasal sinuses are clear. Trace right mastoid effusion noted. Other: None. ASPECTS [facility] Stroke Program Early CT Score) - Ganglionic level infarction (caudate, lentiform nuclei, internal capsule, insula, M1-M3 cortex): 7 - Supraganglionic infarction (M4-M6 cortex): 3 Total score (0-10 with 10 being normal): 10 IMPRESSION: 1. No acute intracranial infarct or other abnormality. 2. ASPECTS is 10. 3. Moderately advanced chronic microvascular  ischemic disease. Critical Value/emergent results were called by telephone at the time of interpretation on 07/14/2020 at 5:41 pm to provider [facility] , who verbally acknowledged these results. Electronically Signed   By: Jeannine Boga M.D.   On: 07/14/2020 17:42     EKG: sinus tachycardia, nonspecific repolarization abnormality. HR 105.   Physical Examination: Temp:  [97.8 F (36.6 C)-99.3 F (37.4 C)] 97.9 F (36.6 C) (10/21 0726) Pulse Rate:  [70-103] 75 (10/21 0726) Resp:  [13-21] 20 (10/21 0726) BP: (110-153)/(51-92) 111/51 (10/21 0726) SpO2:  [92 %-100 %] 95 % (10/21 0726) Weight:  [117.9 kg-125.8 kg] 125.8 kg (10/20 2340)  General - morbid obesity, well developed, in no apparent distress.  Ophthalmologic - fundi not visualized due to noncooperation.  Cardiovascular - Regular rate and rhythm.  Mental Status -  Level of arousal and orientation to time, place, and person were intact. Language including expression, naming, repetition, comprehension was assessed and found intact. Fund of Knowledge was assessed and was intact.  Cranial Nerves II - XII - II - Visual field intact OU. III, IV, VI - Extraocular movements intact. V - Facial sensation intact bilaterally. VII - Facial movement intact bilaterally. VIII - Hearing & vestibular intact bilaterally. X - Palate elevates symmetrically. XI - Chin turning & shoulder shrug intact bilaterally. XII - Tongue protrusion intact.  Motor Strength - The patient's strength was normal in all extremities and pronator drift was absent.  Bulk was normal and fasciculations  were absent.   Motor Tone - Muscle tone was assessed at the neck and appendages and was normal.  Reflexes - The patient's reflexes were symmetrical in all extremities and she had no pathological reflexes.  Sensory - Light touch, temperature/pinprick were assessed and were symmetrical.    Coordination - The patient had normal movements in the hands with no  ataxia or dysmetria.  Tremor was absent.  Gait and Station - deferred.   Assessment:  Kristine Gray is a 69 y.o. female with history of HTN, DM, renal calculi, diverticulosis, arthritis and amenia who presented to [place] 07/14/20 with transient acute R hand sensory numbness which progressed to acute onset aphasia - slurred speech and difficulty getting her words out. She did not receive IV t-PA.   L brain TIA d/t symptomatic L ICA stenosis   Code Stroke CT head No acute abnormality. Moderate Small vessel disease.    CTA head & neck no ELVO. B ICA bulky plaque 70%. Moderate short proximal R P2 stenosis. Fetal L PCA.  MRI  No acute abnormality. Moderate Small vessel disease.   2D Echo pending  LDL 123  HgbA1c pending   SCDs for VTE prophylaxis  aspirin 162mg  daily prior to admission, now on aspirin 325 mg daily and clopidogrel 75 mg daily following plavix load. Further regimen per [facility].  Therapy recommendations:  No anticipated therapy needs  Disposition:  pending   Carotid Stenosis, Bilateral   CTA neck B ICA bulky plaque 70%.   Symptomatic L ICA stenosis   VVS on board  Dr. Donzetta Matters plan for left CEA tomorrow  Hypertension  Stable . Permissive hypertension (OK if < 220/120) but gradually normalize in 5-7 days . Long-term BP goal 130-150 give ICA stenosis   Hyperlipidemia  Home meds:  lipitor 20  Now on lipitor 80  LDL 123, goal < 70  Continue statin at discharge  Diabetes type II  HgbA1c pending, goal < 7.0  CBGs  SSI  Close PCP follow-up  Other Stroke Risk Factors  Advanced age  Morbid Obesity, Body mass index is 47.61 kg/m., recommend weight loss, diet and exercise as appropriate   Other Active Problems  Nausea and vomiting w/ leukocytosis, WBC 12.8 (afebrile)    Hospital day # 0   Thank you for this consultation and allowing [place] to participate in the care of this patient.  Rosalin Hawking, MD PhD Stroke  Neurology 07/15/2020 7:51 PM  To contact Stroke Continuity provider, please refer to [email or web address] After hours, contact General Neurology

## 2020-07-15 NOTE — Progress Notes (Signed)
OT Cancellation Note  Patient Details Name: MAICIE VANDERLOOP MRN: [id number] DOB: [birth year]   Cancelled Treatment:    Reason Eval/Treat Not Completed: (P) Patient at procedure or test/ unavailable (MRI) Will return later.   Knox Holdman,HILLARY 07/15/2020, 9:53 AM  Maurie Boettcher, OT/L   Acute OT Clinical Specialist Acute Rehabilitation Services Pager [phone] Office [phone]

## 2020-07-15 NOTE — Progress Notes (Signed)
  Echocardiogram 2D Echocardiogram has been performed.  Kristine Gray 07/15/2020, 11:42 AM

## 2020-07-15 NOTE — Consult Note (Signed)
Hospital Consult    Reason for Consult:  L brain TIA 2/2 carotid stenosis Referring Physician:  Dr. Avon Gully MRN #:  [id number]  History of Present Illness: This is a 69 y.o. female presented last pm with expressive aphasia and right hand numbness. Symptoms have resolved.  Now on aspirin, plavix and statin was not on plavix at home.  She has never had previous stroke, TIA or amaurosis.  She has no known history of vascular disease.  She is a lifelong non-smoker.  She is diabetic with hypertension hyperlipidemia.  Past Medical History:  Diagnosis Date  . Anemia   . Arthritis 04-02-12   arthritis lower back and spurs  . Diabetes mellitus 04-02-12   oral med and insulin used  . Diverticulosis of colon 04-02-12   hx. of, no problems  . H/O nephrostomy 04-02-12   03-19-12 tube placed and remains lt. flank.  . Hypertension   . Kidney calculi 04-02-12   hx. kidney stones-left, past hx. kidney stones  . Obesity     Past Surgical History:  Procedure Laterality Date  . CESAREAN SECTION  04-02-12   '86  . CHOLECYSTECTOMY    . DILATION AND CURETTAGE OF UTERUS  04-02-12  . iriodotomy  04-02-12   bil. eyes  . NEPHROLITHOTOMY  04/09/2012   Procedure: NEPHROLITHOTOMY PERCUTANEOUS;  Surgeon: Fredricka Bonine, MD;  Location: [facility];  Service: Urology;  Laterality: Left;  . TONSILLECTOMY    . TUBAL LIGATION  04-02-12    '86    Allergies  Allergen Reactions  . Penicillins Hives, Itching and Swelling  . Valsartan Other (See Comments)    Bradycardia    Prior to Admission medications   Medication Sig Start Date End Date Taking? Authorizing Provider  acetaminophen (TYLENOL) 650 MG CR tablet Take 650 mg by mouth every 8 (eight) hours as needed for pain.    [provider]  allopurinol (ZYLOPRIM) 100 MG tablet Take 100 mg by mouth 2 (two) times daily.     [provider]  aspirin EC 81 MG tablet Take 2 tablets (162 mg total) by mouth daily with breakfast. Patient taking  differently: Take 162 mg by mouth at bedtime.  04/14/12   Festus Aloe, MD  atorvastatin (LIPITOR) 20 MG tablet Take 20 mg by mouth daily.    [provider]  colchicine 0.6 MG tablet Take 0.6 mg by mouth daily as needed. As needed for gout flare     [provider]  cyclobenzaprine (FLEXERIL) 10 MG tablet Take 10 mg by mouth daily.    [provider]  diphenhydramine-acetaminophen (TYLENOL PM) 25-500 MG TABS Take 1-2 tablets by mouth at bedtime as needed.    [provider]  fluticasone (FLONASE) 50 MCG/ACT nasal spray Place 1 spray into the nose daily as needed. For congestion    [provider]  folic acid (FOLVITE) 527 MCG tablet Take 400 mcg by mouth daily.    [provider]  gabapentin (NEURONTIN) 300 MG capsule Take 300 mg by mouth daily.    [provider]  hydrochlorothiazide (HYDRODIURIL) 25 MG tablet Take 25 mg by mouth daily.    [provider]  insulin regular human CONCENTRATED (HUMULIN R) 500 UNIT/ML SOLN injection Inject 40-120 Units into the skin 3 (three) times daily with meals. 120 units when she wakes up ~ 12 noon , 70 units at 6 PM and 40-50 units at bedtime (0200-0400 AM)    [provider]  naphazoline-pheniramine (NAPHCON-A) 0.025-0.3 %  ophthalmic solution 1 drop 4 (four) times daily as needed for eye irritation.    [provider]  nebivolol (BYSTOLIC) 10 MG tablet Take 10 mg by mouth 2 (two) times daily.    [provider]  polycarbophil (FIBERCON) 625 MG tablet Take 625 mg by mouth daily.    [provider]  sitaGLIPtin (JANUVIA) 50 MG tablet Take 50 mg by mouth daily.    [provider]  traMADol (ULTRAM) 50 MG tablet Take by mouth every 6 (six) hours as needed.    [provider]  vitamin B-12 (CYANOCOBALAMIN) 1000 MCG tablet Take 1,000 mcg by mouth daily.    [provider]  vitamin E 400 UNIT capsule Take 400 Units by mouth daily.     [provider]    Social History   Socioeconomic History  . Marital status: Married    Spouse name: Not on file  . Number of children: Not on file  . Years of education: Not on file  . Highest education level: Not on file  Occupational History  . Not on file  Tobacco Use  . Smoking status: Never Smoker  . Smokeless tobacco: Never Used  Substance and Sexual Activity  . Alcohol use: No  . Drug use: No  . Sexual activity: Yes  Other Topics Concern  . Not on file  Social History Narrative  . Not on file   Social Determinants of Health   Financial Resource Strain:   . Difficulty of Paying Living Expenses: Not on file  Food Insecurity:   . Worried About Charity fundraiser in the Last Year: Not on file  . Ran Out of Food in the Last Year: Not on file  Transportation Needs:   . Lack of Transportation (Medical): Not on file  . Lack of Transportation (Non-Medical): Not on file  Physical Activity:   . Days of Exercise per Week: Not on file  . Minutes of Exercise per Session: Not on file  Stress:   . Feeling of Stress : Not on file  Social Connections:   . Frequency of Communication with Friends and Family: Not on file  . Frequency of Social Gatherings with Friends and Family: Not on file  . Attends Religious Services: Not on file  . Active Member of Clubs or Organizations: Not on file  . Attends Archivist Meetings: Not on file  . Marital Status: Not on file  Intimate Partner Violence:   . Fear of Current or Ex-Partner: Not on file  . Emotionally Abused: Not on file  . Physically Abused: Not on file  . Sexually Abused: Not on file    Family History  Problem Relation Age of Onset  . Cancer Other   . Hypertension Other   . Hyperlipidemia Other   . Obesity Other   . Heart attack Other   . Breast cancer Maternal Aunt        in mid 68's    ROS: Cardiovascular: []  chest pain/pressure []  palpitations []  SOB lying flat []  DOE []  pain in legs  while walking []  pain in legs at rest []  pain in legs at night []  non-healing ulcers []  hx of DVT []  swelling in legs  Pulmonary: []  productive cough []  asthma/wheezing []  home O2  Neurologic: [x]  weakness in []  arms []  legs [x]  numbness in []  arms []  legs []  hx of CVA []  mini stroke [xdifficulty speaking or slurred speech []  temporary loss of vision in one eye []   dizziness  Hematologic: []  hx of cancer []  bleeding problems []  problems with blood clotting easily  Endocrine:   [x]  diabetes []  thyroid disease  GI []  vomiting blood []  blood in stool  GU: []  CKD/renal failure []  HD--[]  M/W/F or []  T/T/S []  burning with urination []  blood in urine  Psychiatric: []  anxiety []  depression  Musculoskeletal: []  arthritis [x]  finger numbness  Integumentary: []  rashes []  ulcers  Constitutional: []  fever []  chills   Physical Examination  Vitals:   07/15/20 0540 07/15/20 0726  BP: (!) 141/76 (!) 111/51  Pulse: 84 75  Resp: 20 20  Temp: 98 F (36.7 C) 97.9 F (36.6 C)  SpO2: 95% 95%   Body mass index is 47.61 kg/m.  General:  WDWN in NAD Gait: Not observed HENT: WNL, normocephalic Pulmonary: normal non-labored breathing Cardiac: Palpable radial and popliteal pulses Abdomen:  soft, NT/ND, no masses Extremities: No clubbing cyanosis edema Musculoskeletal: no muscle wasting or atrophy  Neurologic: A&O X 3; Appropriate Affect ; SENSATION: normal; MOTOR FUNCTION:  moving all extremities equally. Speech is fluent/normal   CBC    Component Value Date/Time   WBC 12.8 (H) 07/14/2020 1635   RBC 4.98 07/14/2020 1635   HGB 15.4 (H) 07/14/2020 1635   HCT 44.5 07/14/2020 1635   PLT 255 07/14/2020 1635   MCV 89.4 07/14/2020 1635   MCH 30.9 07/14/2020 1635   MCHC 34.6 07/14/2020 1635   RDW 13.6 07/14/2020 1635   LYMPHSABS 5.5 (H) 07/14/2020 1635   MONOABS 1.3 (H) 07/14/2020 1635   EOSABS 0.1 07/14/2020 1635   BASOSABS 0.1 07/14/2020 1635    BMET      Component Value Date/Time   NA 128 (L) 07/14/2020 1635   K 3.5 07/14/2020 1635   CL 89 (L) 07/14/2020 1635   CO2 25 07/14/2020 1635   GLUCOSE 167 (H) 07/14/2020 1635   BUN 19 07/14/2020 1635   CREATININE 1.61 (H) 07/14/2020 1635   CALCIUM 9.5 07/14/2020 1635   GFRNONAA 32 (L) 07/14/2020 1635   GFRAA 39 (L) 04/10/2012 0415    COAGS: Lab Results  Component Value Date   INR 1.0 07/14/2020   INR 0.95 04/02/2012     Non-Invasive Vascular Imaging:   MRI IMPRESSION: No acute infarction, hemorrhage, or mass. Moderate chronic microvascular ischemic changes.   CTA neck/head IMPRESSION: 1. Negative CTA for emergent large vessel occlusion. 2. Bulky calcified atheromatous plaque about the carotid bifurcations/proximal ICAs with associated stenoses of up to 70% bilaterally. 3. Moderate short-segment proximal right P2 stenosis. 4. Fetal type origin of the left PCA.   ASSESSMENT/PLAN: This is a 69 y.o. female with symptomatic left carotid lesion.  Plan will be for left carotid endarterectomy tomorrow.  I discussed the alternative therapy of best medical treatment versus carotid stenting.  I do not think she is a candidate for carotid stent due to the high amount of calcium.  We discussed the risks as well as the benefits and she demonstrates good understanding.  Okay to continue aspirin Plavix.  N.p.o. past midnight.  Makinzi Prieur C. Donzetta Matters, MD [facility] Office: [phone] Pager: [phone]

## 2020-07-15 NOTE — Progress Notes (Signed)
SLP Cancellation Note  Patient Details Name: Kristine Gray MRN: [id number] DOB: [birth year]   Cancelled treatment:       Reason Eval/Treat Not Completed: Patient unavailable (having testing in room). Will f/u as able.   Osie Bond., M.A. [facility] Pager [phone] Office [phone]  07/15/2020, 10:44 AM

## 2020-07-15 NOTE — Progress Notes (Addendum)
Trying to reach RN to obtain Pt status and set up a time for pt to get MRI 0329 called floor no anser 0223 called floor no answer 0149 called floor no answer

## 2020-07-15 NOTE — H&P (Signed)
History and Physical   PHYLLISTINE DOMINGOS RWE:[id number] DOB: [birth year] DOA: 07/14/2020  PCP: Bernerd Limbo, MD  Patient coming from: home   I have personally briefly reviewed patient's old medical records in [place].  Chief Concern: difficulty speaking   HPI: Kristine Gray is a 69 y.o. female with medical history significant for morbid obesity, IDDM on U500, presents for acute onset aphasia that started around 4:30 PM on 07/14/20. Husband brought to ED and patient admitted for TIA.  ED Course: reviewed ED note and neurology consult. Vitals are stable.  Review of Systems: As per HPI otherwise 10 point review of systems negative.   Past Medical History:  Diagnosis Date  . Anemia   . Arthritis 04-02-12   arthritis lower back and spurs  . Diabetes mellitus 04-02-12   oral med and insulin used  . Diverticulosis of colon 04-02-12   hx. of, no problems  . H/O nephrostomy 04-02-12   03-19-12 tube placed and remains lt. flank.  . Hypertension   . Kidney calculi 04-02-12   hx. kidney stones-left, past hx. kidney stones  . Obesity    Past Surgical History:  Procedure Laterality Date  . CESAREAN SECTION  04-02-12   '86  . CHOLECYSTECTOMY    . DILATION AND CURETTAGE OF UTERUS  04-02-12  . iriodotomy  04-02-12   bil. eyes  . NEPHROLITHOTOMY  04/09/2012   Procedure: NEPHROLITHOTOMY PERCUTANEOUS;  Surgeon: Fredricka Bonine, MD;  Location: [facility];  Service: Urology;  Laterality: Left;  . TONSILLECTOMY    . TUBAL LIGATION  04-02-12    '86   Social History:  reports that she has never smoked. She has never used smokeless tobacco. She reports that she does not drink alcohol and does not use drugs.  Allergies  Allergen Reactions  . Penicillins Hives, Itching and Swelling  . Valsartan Other (See Comments)    Bradycardia   Family History  Problem Relation Age of Onset  . Cancer Other   . Hypertension Other   . Hyperlipidemia Other   . Obesity Other   . Heart attack Other   .  Breast cancer Maternal Aunt        in mid 93's   Family history: Family history reviewed and not pertinent  Prior to Admission medications   Medication Sig Start Date End Date Taking? Authorizing Provider  acetaminophen (TYLENOL) 650 MG CR tablet Take 650 mg by mouth every 8 (eight) hours as needed for pain.    [provider]  allopurinol (ZYLOPRIM) 100 MG tablet Take 100 mg by mouth 2 (two) times daily.     [provider]  aspirin EC 81 MG tablet Take 2 tablets (162 mg total) by mouth daily with breakfast. Patient taking differently: Take 162 mg by mouth at bedtime.  04/14/12   Festus Aloe, MD  atorvastatin (LIPITOR) 20 MG tablet Take 20 mg by mouth daily.    [provider]  colchicine 0.6 MG tablet Take 0.6 mg by mouth daily as needed. As needed for gout flare     [provider]  cyclobenzaprine (FLEXERIL) 10 MG tablet Take 10 mg by mouth daily.    [provider]  diphenhydramine-acetaminophen (TYLENOL PM) 25-500 MG TABS Take 1-2 tablets by mouth at bedtime as needed.    [provider]  fluticasone (FLONASE) 50 MCG/ACT nasal spray Place 1 spray into the nose daily as needed. For congestion    [provider]  folic acid (FOLVITE)  400 MCG tablet Take 400 mcg by mouth daily.    [provider]  gabapentin (NEURONTIN) 300 MG capsule Take 300 mg by mouth daily.    [provider]  hydrochlorothiazide (HYDRODIURIL) 25 MG tablet Take 25 mg by mouth daily.    [provider]  insulin regular human CONCENTRATED (HUMULIN R) 500 UNIT/ML SOLN injection Inject 5-20 Units into the skin 2 (two) times daily with a meal. 20 units in the morning and 5 units in the evening    [provider]  naphazoline-pheniramine (NAPHCON-A) 0.025-0.3 % ophthalmic solution 1 drop 4 (four) times daily as needed for eye irritation.    [provider]  nebivolol (BYSTOLIC) 10 MG tablet Take 10 mg by mouth 2  (two) times daily.    [provider]  polycarbophil (FIBERCON) 625 MG tablet Take 625 mg by mouth daily.    [provider]  sitaGLIPtin (JANUVIA) 50 MG tablet Take 50 mg by mouth daily.    [provider]  traMADol (ULTRAM) 50 MG tablet Take by mouth every 6 (six) hours as needed.    [provider]  vitamin B-12 (CYANOCOBALAMIN) 1000 MCG tablet Take 1,000 mcg by mouth daily.    [provider]  vitamin E 400 UNIT capsule Take 400 Units by mouth daily.    [provider]   Physical Exam: Vitals:   07/14/20 2200 07/14/20 2215 07/14/20 2230 07/14/20 2340  BP: (!) 128/57 (!) 130/59 (!) 132/55 (!) 153/60  Pulse: 75 79 77 87  Resp: 16 20 14 18   Temp:   98.6 F (37 C) 99.3 F (37.4 C)  TempSrc:   Oral Oral  SpO2: 96% 96% 97% 100%  Weight:    125.8 kg  Height:    5\' 4"  (1.626 m)   Constitutional: NAD, calm, comfortable Eyes: PERRL, lids and conjunctivae normal ENMT: Mucous membranes are moist. Posterior pharynx clear of any exudate or lesions.Normal dentition.  Neck: normal, supple, no masses, no thyromegaly Respiratory: clear to auscultation bilaterally, no wheezing, no crackles. Normal respiratory effort. No accessory muscle use.  Cardiovascular: Regular rate and rhythm, no murmurs / rubs / gallops. No extremity edema. 2+ pedal pulses. No carotid bruits.  Abdomen: morbidly obese abdomen with panus, no tenderness, no masses palpated. No hepatosplenomegaly. Bowel sounds positive.  Musculoskeletal: no clubbing / cyanosis. No joint deformity upper and lower extremities. Good ROM, no contractures. Normal muscle tone.  Skin: no rashes, lesions, ulcers. No induration Neurologic: CN 2-12 grossly intact. Sensation intact, DTR normal. Strength 5/5 in all 4.  Psychiatric: Normal judgment and insight. Alert and oriented x 3. Normal mood.   Labs on Admission: I have personally reviewed following labs and imaging studies  CBC: Recent Labs  Lab  07/14/20 1635  WBC 12.8*  NEUTROABS 5.8  HGB 15.4*  HCT 44.5  MCV 89.4  PLT 993   Basic Metabolic Panel: Recent Labs  Lab 07/14/20 1635  NA 128*  K 3.5  CL 89*  CO2 25  GLUCOSE 167*  BUN 19  CREATININE 1.61*  CALCIUM 9.5   GFR: Estimated Creatinine Clearance: 42.7 mL/min (A) (by C-G formula based on SCr of 1.61 mg/dL (H)). Liver Function Tests: Recent Labs  Lab 07/14/20 1635  AST 42*  ALT 44  ALKPHOS 69  BILITOT 1.2  PROT 7.5  ALBUMIN 4.0   No results for input(s): LIPASE, AMYLASE in the last 168 hours. No results for input(s): AMMONIA in the last 168 hours. Coagulation Profile: Recent  Labs  Lab 07/14/20 1635  INR 1.0   CBG: Recent Labs  Lab 07/14/20 1721 07/14/20 2233  GLUCAP 173* 164*   Urine analysis:    Component Value Date/Time   COLORURINE YELLOW 07/14/2020 2158   APPEARANCEUR CLEAR 07/14/2020 2158   LABSPEC <1.005 (L) 07/14/2020 2158   PHURINE 6.0 07/14/2020 2158   GLUCOSEU 250 (A) 07/14/2020 2158   HGBUR NEGATIVE 07/14/2020 2158   BILIRUBINUR NEGATIVE 07/14/2020 2158   KETONESUR NEGATIVE 07/14/2020 2158   PROTEINUR NEGATIVE 07/14/2020 2158   UROBILINOGEN 0.2 03/18/2012 2039   NITRITE NEGATIVE 07/14/2020 2158   LEUKOCYTESUR NEGATIVE 07/14/2020 2158   Radiological Exams on Admission: Personally reviewed and I agree with radiologist reading as below. CT Angio Head W or Wo Contrast  Result Date: 07/14/2020 CLINICAL DATA:  Initial evaluation for acute slurred speech. EXAM: CT ANGIOGRAPHY HEAD AND NECK TECHNIQUE: Multidetector CT imaging of the head and neck was performed using the standard protocol during bolus administration of intravenous contrast. Multiplanar CT image reconstructions and MIPs were obtained to evaluate the vascular anatomy. Carotid stenosis measurements (when applicable) are obtained utilizing NASCET criteria, using the distal internal carotid diameter as the denominator. CONTRAST:  186mL OMNIPAQUE IOHEXOL 350 MG/ML SOLN  COMPARISON:  Prior CT from earlier same day. FINDINGS: CTA NECK FINDINGS Aortic arch: Visualized aortic arch normal caliber with normal 3 vessel morphology. Mild atheromatous change about the arch itself. No hemodynamically significant stenosis seen about the origin of the great vessels. Right carotid system: Right CCA patent from its origin to the bifurcation without stenosis. Bulky calcified plaque about the right bifurcation/proximal right ICA with associated stenosis of up to approximately 70% by NASCET criteria. Right ICA patent distally to the skull base without stenosis, dissection or occlusion. Left carotid system: Left CCA patent from its origin to the bifurcation without stenosis. Bulky calcified plaque about the left bifurcation/proximal left ICA with associated stenosis of up to approximately 70% by NASCET criteria. Left ICA patent distally to the skull base without stenosis, dissection or occlusion. Vertebral arteries: Both vertebral arteries arise from the subclavian arteries. No proximal subclavian artery stenosis. Both vertebral arteries patent within the neck without stenosis, dissection or occlusion. Skeleton: No visible acute osseous abnormality. No discrete or worrisome osseous lesions. Advanced multilevel right-sided facet arthrosis noted throughout the cervical spine. Moderate cervical spondylosis present at C5-6 and C6-7. Other neck: No other acute soft tissue abnormality within the neck. No mass lesion or adenopathy. Upper chest: Scattered atelectatic changes noted within the visualized lungs. Visualized upper chest demonstrates no other acute finding. Review of the MIP images confirms the above findings CTA HEAD FINDINGS Anterior circulation: Petrous segments patent bilaterally. Diffuse calcified atheromatous plaque seen throughout the carotid siphons with no more than mild diffuse stenosis. A1 segments patent bilaterally. Normal anterior communicating artery complex. Anterior cerebral  arteries patent to their distal aspects without stenosis. No M1 stenosis or occlusion. Normal MCA bifurcations. Distal MCA branches well perfused and symmetric. Posterior circulation: Both vertebral arteries patent to the vertebrobasilar junction without stenosis. Both picas patent. Basilar patent to its distal aspect without stenosis. Superior cerebral arteries patent bilaterally. Right PCA supplied via the basilar. Fetal type origin of the left PCA. Short-segment moderate proximal right P2 stenosis noted (series 8, image 118). Both PCAs otherwise widely patent and well perfused to their distal aspects. Venous sinuses: Grossly patent allowing for timing the contrast bolus. Anatomic variants: Fetal type origin of the left PCA. No intracranial aneurysm. Review of the MIP images confirms  the above findings IMPRESSION: 1. Negative CTA for emergent large vessel occlusion. 2. Bulky calcified atheromatous plaque about the carotid bifurcations/proximal ICAs with associated stenoses of up to 70% bilaterally. 3. Moderate short-segment proximal right P2 stenosis. 4. Fetal type origin of the left PCA. Critical Value/emergent results were called by telephone at the time of interpretation poor on 07/14/2020 at 6:02 pm to provider [facility] , who verbally acknowledged these results. Electronically Signed   By: Jeannine Boga M.D.   On: 07/14/2020 18:07   CT Angio Neck W and/or Wo Contrast  Result Date: 07/14/2020 CLINICAL DATA:  Initial evaluation for acute slurred speech. EXAM: CT ANGIOGRAPHY HEAD AND NECK TECHNIQUE: Multidetector CT imaging of the head and neck was performed using the standard protocol during bolus administration of intravenous contrast. Multiplanar CT image reconstructions and MIPs were obtained to evaluate the vascular anatomy. Carotid stenosis measurements (when applicable) are obtained utilizing NASCET criteria, using the distal internal carotid diameter as the denominator. CONTRAST:  166mL  OMNIPAQUE IOHEXOL 350 MG/ML SOLN COMPARISON:  Prior CT from earlier same day. FINDINGS: CTA NECK FINDINGS Aortic arch: Visualized aortic arch normal caliber with normal 3 vessel morphology. Mild atheromatous change about the arch itself. No hemodynamically significant stenosis seen about the origin of the great vessels. Right carotid system: Right CCA patent from its origin to the bifurcation without stenosis. Bulky calcified plaque about the right bifurcation/proximal right ICA with associated stenosis of up to approximately 70% by NASCET criteria. Right ICA patent distally to the skull base without stenosis, dissection or occlusion. Left carotid system: Left CCA patent from its origin to the bifurcation without stenosis. Bulky calcified plaque about the left bifurcation/proximal left ICA with associated stenosis of up to approximately 70% by NASCET criteria. Left ICA patent distally to the skull base without stenosis, dissection or occlusion. Vertebral arteries: Both vertebral arteries arise from the subclavian arteries. No proximal subclavian artery stenosis. Both vertebral arteries patent within the neck without stenosis, dissection or occlusion. Skeleton: No visible acute osseous abnormality. No discrete or worrisome osseous lesions. Advanced multilevel right-sided facet arthrosis noted throughout the cervical spine. Moderate cervical spondylosis present at C5-6 and C6-7. Other neck: No other acute soft tissue abnormality within the neck. No mass lesion or adenopathy. Upper chest: Scattered atelectatic changes noted within the visualized lungs. Visualized upper chest demonstrates no other acute finding. Review of the MIP images confirms the above findings CTA HEAD FINDINGS Anterior circulation: Petrous segments patent bilaterally. Diffuse calcified atheromatous plaque seen throughout the carotid siphons with no more than mild diffuse stenosis. A1 segments patent bilaterally. Normal anterior communicating artery  complex. Anterior cerebral arteries patent to their distal aspects without stenosis. No M1 stenosis or occlusion. Normal MCA bifurcations. Distal MCA branches well perfused and symmetric. Posterior circulation: Both vertebral arteries patent to the vertebrobasilar junction without stenosis. Both picas patent. Basilar patent to its distal aspect without stenosis. Superior cerebral arteries patent bilaterally. Right PCA supplied via the basilar. Fetal type origin of the left PCA. Short-segment moderate proximal right P2 stenosis noted (series 8, image 118). Both PCAs otherwise widely patent and well perfused to their distal aspects. Venous sinuses: Grossly patent allowing for timing the contrast bolus. Anatomic variants: Fetal type origin of the left PCA. No intracranial aneurysm. Review of the MIP images confirms the above findings IMPRESSION: 1. Negative CTA for emergent large vessel occlusion. 2. Bulky calcified atheromatous plaque about the carotid bifurcations/proximal ICAs with associated stenoses of up to 70% bilaterally. 3. Moderate short-segment proximal  right P2 stenosis. 4. Fetal type origin of the left PCA. Critical Value/emergent results were called by telephone at the time of interpretation poor on 07/14/2020 at 6:02 pm to provider [facility] , who verbally acknowledged these results. Electronically Signed   By: Jeannine Boga M.D.   On: 07/14/2020 18:07   CT HEAD CODE STROKE WO CONTRAST`  Result Date: 07/14/2020 CLINICAL DATA:  Code stroke. Initial evaluation for acute right arm numbness, speech difficulty. EXAM: CT HEAD WITHOUT CONTRAST TECHNIQUE: Contiguous axial images were obtained from the base of the skull through the vertex without intravenous contrast. COMPARISON:  Prior CT from 03/16/2010. FINDINGS: Brain: Generalized age-related cerebral atrophy. Patchy and confluent hypodensity within the periventricular deep white matter both cerebral hemispheres most consistent with chronic  small vessel ischemic disease, moderate in nature. No acute intracranial hemorrhage. No acute large vessel territory infarct. No mass lesion or midline shift. No hydrocephalus or extra-axial fluid collection. Vascular: No hyperdense vessel. Calcified atherosclerosis at the skull base. Skull: Scalp soft tissues and calvarium within normal limits. Sinuses/Orbits: Globes and orbital soft tissues within normal limits. Paranasal sinuses are clear. Trace right mastoid effusion noted. Other: None. ASPECTS [facility] Stroke Program Early CT Score) - Ganglionic level infarction (caudate, lentiform nuclei, internal capsule, insula, M1-M3 cortex): 7 - Supraganglionic infarction (M4-M6 cortex): 3 Total score (0-10 with 10 being normal): 10 IMPRESSION: 1. No acute intracranial infarct or other abnormality. 2. ASPECTS is 10. 3. Moderately advanced chronic microvascular ischemic disease. Critical Value/emergent results were called by telephone at the time of interpretation on 07/14/2020 at 5:41 pm to provider [facility] , who verbally acknowledged these results. Electronically Signed   By: Jeannine Boga M.D.   On: 07/14/2020 17:42   EKG: Independently reviewed, showing sinus tachycardia   Assessment/Plan  Active Problems:   TIA (transient ischemic attack)   TIA -  - Permissive hypertension - MRI of brain pending - Echo ordered - Normal saline ivf - ASA increased to 325 per teleneurologist - consider Dapt per [facility] neurology team - Consider increasing statin to high intensity per neurology recommendation - Neurology [facility] will follow-   # Nausea and vomiting with leukocytosis - AM provider to consider CT abdomen and patient has history of diverticulosis - She denies abdominal pain during evaluation  DVT prophylaxis: SCDs Code Status: FULL Diet: heart/carb diet Family Communication: no Disposition Plan: pending clinical course Consults called: Called night neurologist and Dr. Curly Sh[place] will put her on the  list to be seen tomorrow Admission status: obes with telemetry  Daryl Beehler N Xaine Sansom D.O. [facility]  If 7AM-7PM, please contact day-coverage provider [email or web address]  07/15/2020, 12:43 AM

## 2020-07-15 NOTE — Progress Notes (Signed)
PROGRESS NOTE    Kristine Gray  WLN:[id number] DOB: [birth year] DOA: 07/14/2020 PCP: Bernerd Limbo, MD   Brief Narrative:  Kristine Gray is a 69 y.o. female with medical history significant for morbid obesity, IDDM on U500, presents for acute onset aphasia that started around 4:30 PM on 07/14/20. Husband brought to ED and patient admitted for TIA.  Assessment & Plan:   Active Problems:   DM2 (diabetes mellitus, type 2) (HCC)   HTN (hypertension)   Hyperlipidemia   TIA (transient ischemic attack)   Acute aphasia - rule out CVA vs TIA, resolving - Neurology following, appreciate insight recommendations -Repeat MRI today shows no acute infarct hemorrhage or mass only moderate chronic microvascular ischemic changes -Echo completed, pending formal read -Continue aspirin Plavix statin per neurology  Insulin-dependent diabetes type 2, controlled  - Patient on U5 100 at home with varying doses of 120, 70 and 50 units throughout the day - A1c 6.7 earlier this year - Discussed with pharmacy, will transition to 80 units 3 times daily given patient has total to 40 units at home daily -Lengthy discussion about dietary lifestyle compliance, monitor closely given high doses of insulin and concern for hypoglycemic protocol   Nausea and vomiting with leukocytosis, rule out gastroenteritis - AM provider to consider CT abdomen and patient has history of diverticulosis - Symptoms appear to have resolved  DVT prophylaxis: SCDs Code Status: FULL Family Communication: None present  Status is: Inpatient  Dispo: The patient is from: Home              Anticipated d/c is to: To be determined              Anticipated d/c date is: 48 to 72 hours              Patient currently not medically stable for discharge given ongoing need for further imaging evaluation and management with neurology and possible placement given PT evaluation  Consultants:   Neurology  Procedures:   None  planned  Antimicrobials:  None indicated  Subjective: No acute issues or events overnight  Objective: Vitals:   07/15/20 0140 07/15/20 0340 07/15/20 0540 07/15/20 0726  BP: (!) 131/59 110/60 (!) 141/76 (!) 111/51  Pulse: 82 82 84 75  Resp: 17 18 20 20   Temp: 99.1 F (37.3 C) 98.1 F (36.7 C) 98 F (36.7 C) 97.9 F (36.6 C)  TempSrc: Oral Oral Oral Oral  SpO2: 99% 99% 95% 95%  Weight:      Height:        Intake/Output Summary (Last 24 hours) at 07/15/2020 0751 Last data filed at 07/15/2020 0300 Gross per 24 hour  Intake 340 ml  Output --  Net 340 ml   Filed Weights   07/14/20 1721 07/14/20 2340  Weight: 117.9 kg 125.8 kg    Examination:  General:  Pleasantly resting in bed, No acute distress. HEENT:  Normocephalic atraumatic.  Sclerae nonicteric, noninjected.  Extraocular movements intact bilaterally. Neck:  Without mass or deformity.  Trachea is midline. Lungs:  Clear to auscultate bilaterally without rhonchi, wheeze, or rales. Heart:  Regular rate and rhythm.  Without murmurs, rubs, or gallops. Abdomen:  Soft, nontender, nondistended.  Without guarding or rebound. Extremities: Without cyanosis, clubbing, edema, or obvious deformity. Vascular:  Dorsalis pedis and posterior tibial pulses palpable bilaterally. Skin:  Warm and dry, no erythema, no ulcerations.    Data Reviewed: I have personally reviewed following labs and imaging studies  CBC: Recent Labs  Lab 07/14/20 1635  WBC 12.8*  NEUTROABS 5.8  HGB 15.4*  HCT 44.5  MCV 89.4  PLT 073   Basic Metabolic Panel: Recent Labs  Lab 07/14/20 1635  NA 128*  K 3.5  CL 89*  CO2 25  GLUCOSE 167*  BUN 19  CREATININE 1.61*  CALCIUM 9.5   GFR: Estimated Creatinine Clearance: 42.7 mL/min (A) (by C-G formula based on SCr of 1.61 mg/dL (H)). Liver Function Tests: Recent Labs  Lab 07/14/20 1635  AST 42*  ALT 44  ALKPHOS 69  BILITOT 1.2  PROT 7.5  ALBUMIN 4.0   No results for input(s): LIPASE,  AMYLASE in the last 168 hours. No results for input(s): AMMONIA in the last 168 hours. Coagulation Profile: Recent Labs  Lab 07/14/20 1635  INR 1.0   Cardiac Enzymes: Recent Labs  Lab 07/15/20 0242  CKTOTAL 178   BNP (last 3 results) No results for input(s): PROBNP in the last 8760 hours. HbA1C: No results for input(s): HGBA1C in the last 72 hours. CBG: Recent Labs  Lab 07/14/20 1721 07/14/20 2233  GLUCAP 173* 164*   Lipid Profile: Recent Labs    07/15/20 0242  CHOL 203*  HDL 52  LDLCALC 123*  TRIG 139  CHOLHDL 3.9   Thyroid Function Tests: No results for input(s): TSH, T4TOTAL, FREET4, T3FREE, THYROIDAB in the last 72 hours. Anemia Panel: No results for input(s): VITAMINB12, FOLATE, FERRITIN, TIBC, IRON, RETICCTPCT in the last 72 hours. Sepsis Labs: No results for input(s): PROCALCITON, LATICACIDVEN in the last 168 hours.  Recent Results (from the past 240 hour(s))  Respiratory Panel by RT PCR (Flu A&B, Covid) - Nasopharyngeal Swab     Status: None   Collection Time: 07/14/20  6:23 PM   Specimen: Nasopharyngeal Swab  Result Value Ref Range Status   SARS Coronavirus 2 by RT PCR NEGATIVE NEGATIVE Final    Comment: (NOTE) SARS-CoV-2 target nucleic acids are NOT DETECTED.  The SARS-CoV-2 RNA is generally detectable in upper respiratoy specimens during the acute phase of infection. The lowest concentration of SARS-CoV-2 viral copies this assay can detect is 131 copies/mL. A negative result does not preclude SARS-Cov-2 infection and should not be used as the sole basis for treatment or other patient management decisions. A negative result may occur with  improper specimen collection/handling, submission of specimen other than nasopharyngeal swab, presence of viral mutation(s) within the areas targeted by this assay, and inadequate number of viral copies (<131 copies/mL). A negative result must be combined with clinical observations, patient history, and  epidemiological information. The expected result is Negative.  Fact Sheet for Patients:  PinkCheek.be  Fact Sheet for Healthcare Providers:  GravelBags.it  This test is no t yet approved or cleared by the [place] FDA and  has been authorized for detection and/or diagnosis of SARS-CoV-2 by FDA under an Emergency Use Authorization (EUA). This EUA will remain  in effect (meaning this test can be used) for the duration of the COVID-19 declaration under Section 564(b)(1) of the Act, 21 U.S.C. section 360bbb-3(b)(1), unless the authorization is terminated or revoked sooner.     Influenza A by PCR NEGATIVE NEGATIVE Final   Influenza B by PCR NEGATIVE NEGATIVE Final    Comment: (NOTE) The Xpert Xpress SARS-CoV-2/FLU/RSV assay is intended as an aid in  the diagnosis of influenza from Nasopharyngeal swab specimens and  should not be used as a sole basis for treatment. Nasal washings and  aspirates are unacceptable for Xpert Xpress SARS-CoV-2/FLU/RSV  testing.  Fact Sheet for Patients: PinkCheek.be  Fact Sheet for Healthcare Providers: GravelBags.it  This test is not yet approved or cleared by the [place] FDA and  has been authorized for detection and/or diagnosis of SARS-CoV-2 by  FDA under an Emergency Use Authorization (EUA). This EUA will remain  in effect (meaning this test can be used) for the duration of the  Covid-19 declaration under Section 564(b)(1) of the Act, 21  U.S.C. section 360bbb-3(b)(1), unless the authorization is  terminated or revoked. Performed at [facility], [address], [place], [place] [address]          Radiology Studies: CT Angio Head W or Wo Contrast  Result Date: 07/14/2020 CLINICAL DATA:  Initial evaluation for acute slurred speech. EXAM: CT ANGIOGRAPHY HEAD AND NECK TECHNIQUE: Multidetector CT imaging of  the head and neck was performed using the standard protocol during bolus administration of intravenous contrast. Multiplanar CT image reconstructions and MIPs were obtained to evaluate the vascular anatomy. Carotid stenosis measurements (when applicable) are obtained utilizing NASCET criteria, using the distal internal carotid diameter as the denominator. CONTRAST:  147mL OMNIPAQUE IOHEXOL 350 MG/ML SOLN COMPARISON:  Prior CT from earlier same day. FINDINGS: CTA NECK FINDINGS Aortic arch: Visualized aortic arch normal caliber with normal 3 vessel morphology. Mild atheromatous change about the arch itself. No hemodynamically significant stenosis seen about the origin of the great vessels. Right carotid system: Right CCA patent from its origin to the bifurcation without stenosis. Bulky calcified plaque about the right bifurcation/proximal right ICA with associated stenosis of up to approximately 70% by NASCET criteria. Right ICA patent distally to the skull base without stenosis, dissection or occlusion. Left carotid system: Left CCA patent from its origin to the bifurcation without stenosis. Bulky calcified plaque about the left bifurcation/proximal left ICA with associated stenosis of up to approximately 70% by NASCET criteria. Left ICA patent distally to the skull base without stenosis, dissection or occlusion. Vertebral arteries: Both vertebral arteries arise from the subclavian arteries. No proximal subclavian artery stenosis. Both vertebral arteries patent within the neck without stenosis, dissection or occlusion. Skeleton: No visible acute osseous abnormality. No discrete or worrisome osseous lesions. Advanced multilevel right-sided facet arthrosis noted throughout the cervical spine. Moderate cervical spondylosis present at C5-6 and C6-7. Other neck: No other acute soft tissue abnormality within the neck. No mass lesion or adenopathy. Upper chest: Scattered atelectatic changes noted within the visualized  lungs. Visualized upper chest demonstrates no other acute finding. Review of the MIP images confirms the above findings CTA HEAD FINDINGS Anterior circulation: Petrous segments patent bilaterally. Diffuse calcified atheromatous plaque seen throughout the carotid siphons with no more than mild diffuse stenosis. A1 segments patent bilaterally. Normal anterior communicating artery complex. Anterior cerebral arteries patent to their distal aspects without stenosis. No M1 stenosis or occlusion. Normal MCA bifurcations. Distal MCA branches well perfused and symmetric. Posterior circulation: Both vertebral arteries patent to the vertebrobasilar junction without stenosis. Both picas patent. Basilar patent to its distal aspect without stenosis. Superior cerebral arteries patent bilaterally. Right PCA supplied via the basilar. Fetal type origin of the left PCA. Short-segment moderate proximal right P2 stenosis noted (series 8, image 118). Both PCAs otherwise widely patent and well perfused to their distal aspects. Venous sinuses: Grossly patent allowing for timing the contrast bolus. Anatomic variants: Fetal type origin of the left PCA. No intracranial aneurysm. Review of the MIP images confirms the above findings IMPRESSION: 1. Negative CTA for emergent large  vessel occlusion. 2. Bulky calcified atheromatous plaque about the carotid bifurcations/proximal ICAs with associated stenoses of up to 70% bilaterally. 3. Moderate short-segment proximal right P2 stenosis. 4. Fetal type origin of the left PCA. Critical Value/emergent results were called by telephone at the time of interpretation poor on 07/14/2020 at 6:02 pm to provider [facility] , who verbally acknowledged these results. Electronically Signed   By: Jeannine Boga M.D.   On: 07/14/2020 18:07   CT Angio Neck W and/or Wo Contrast  Result Date: 07/14/2020 CLINICAL DATA:  Initial evaluation for acute slurred speech. EXAM: CT ANGIOGRAPHY HEAD AND NECK  TECHNIQUE: Multidetector CT imaging of the head and neck was performed using the standard protocol during bolus administration of intravenous contrast. Multiplanar CT image reconstructions and MIPs were obtained to evaluate the vascular anatomy. Carotid stenosis measurements (when applicable) are obtained utilizing NASCET criteria, using the distal internal carotid diameter as the denominator. CONTRAST:  173mL OMNIPAQUE IOHEXOL 350 MG/ML SOLN COMPARISON:  Prior CT from earlier same day. FINDINGS: CTA NECK FINDINGS Aortic arch: Visualized aortic arch normal caliber with normal 3 vessel morphology. Mild atheromatous change about the arch itself. No hemodynamically significant stenosis seen about the origin of the great vessels. Right carotid system: Right CCA patent from its origin to the bifurcation without stenosis. Bulky calcified plaque about the right bifurcation/proximal right ICA with associated stenosis of up to approximately 70% by NASCET criteria. Right ICA patent distally to the skull base without stenosis, dissection or occlusion. Left carotid system: Left CCA patent from its origin to the bifurcation without stenosis. Bulky calcified plaque about the left bifurcation/proximal left ICA with associated stenosis of up to approximately 70% by NASCET criteria. Left ICA patent distally to the skull base without stenosis, dissection or occlusion. Vertebral arteries: Both vertebral arteries arise from the subclavian arteries. No proximal subclavian artery stenosis. Both vertebral arteries patent within the neck without stenosis, dissection or occlusion. Skeleton: No visible acute osseous abnormality. No discrete or worrisome osseous lesions. Advanced multilevel right-sided facet arthrosis noted throughout the cervical spine. Moderate cervical spondylosis present at C5-6 and C6-7. Other neck: No other acute soft tissue abnormality within the neck. No mass lesion or adenopathy. Upper chest: Scattered atelectatic  changes noted within the visualized lungs. Visualized upper chest demonstrates no other acute finding. Review of the MIP images confirms the above findings CTA HEAD FINDINGS Anterior circulation: Petrous segments patent bilaterally. Diffuse calcified atheromatous plaque seen throughout the carotid siphons with no more than mild diffuse stenosis. A1 segments patent bilaterally. Normal anterior communicating artery complex. Anterior cerebral arteries patent to their distal aspects without stenosis. No M1 stenosis or occlusion. Normal MCA bifurcations. Distal MCA branches well perfused and symmetric. Posterior circulation: Both vertebral arteries patent to the vertebrobasilar junction without stenosis. Both picas patent. Basilar patent to its distal aspect without stenosis. Superior cerebral arteries patent bilaterally. Right PCA supplied via the basilar. Fetal type origin of the left PCA. Short-segment moderate proximal right P2 stenosis noted (series 8, image 118). Both PCAs otherwise widely patent and well perfused to their distal aspects. Venous sinuses: Grossly patent allowing for timing the contrast bolus. Anatomic variants: Fetal type origin of the left PCA. No intracranial aneurysm. Review of the MIP images confirms the above findings IMPRESSION: 1. Negative CTA for emergent large vessel occlusion. 2. Bulky calcified atheromatous plaque about the carotid bifurcations/proximal ICAs with associated stenoses of up to 70% bilaterally. 3. Moderate short-segment proximal right P2 stenosis. 4. Fetal type origin of the left  PCA. Critical Value/emergent results were called by telephone at the time of interpretation poor on 07/14/2020 at 6:02 pm to provider [facility] , who verbally acknowledged these results. Electronically Signed   By: Jeannine Boga M.D.   On: 07/14/2020 18:07   CT HEAD CODE STROKE WO CONTRAST`  Result Date: 07/14/2020 CLINICAL DATA:  Code stroke. Initial evaluation for acute right arm  numbness, speech difficulty. EXAM: CT HEAD WITHOUT CONTRAST TECHNIQUE: Contiguous axial images were obtained from the base of the skull through the vertex without intravenous contrast. COMPARISON:  Prior CT from 03/16/2010. FINDINGS: Brain: Generalized age-related cerebral atrophy. Patchy and confluent hypodensity within the periventricular deep white matter both cerebral hemispheres most consistent with chronic small vessel ischemic disease, moderate in nature. No acute intracranial hemorrhage. No acute large vessel territory infarct. No mass lesion or midline shift. No hydrocephalus or extra-axial fluid collection. Vascular: No hyperdense vessel. Calcified atherosclerosis at the skull base. Skull: Scalp soft tissues and calvarium within normal limits. Sinuses/Orbits: Globes and orbital soft tissues within normal limits. Paranasal sinuses are clear. Trace right mastoid effusion noted. Other: None. ASPECTS [facility] Stroke Program Early CT Score) - Ganglionic level infarction (caudate, lentiform nuclei, internal capsule, insula, M1-M3 cortex): 7 - Supraganglionic infarction (M4-M6 cortex): 3 Total score (0-10 with 10 being normal): 10 IMPRESSION: 1. No acute intracranial infarct or other abnormality. 2. ASPECTS is 10. 3. Moderately advanced chronic microvascular ischemic disease. Critical Value/emergent results were called by telephone at the time of interpretation on 07/14/2020 at 5:41 pm to provider [facility] , who verbally acknowledged these results. Electronically Signed   By: Jeannine Boga M.D.   On: 07/14/2020 17:42        Scheduled Meds: . allopurinol  100 mg Oral Daily  . aspirin EC  325 mg Oral Q breakfast  . atorvastatin  80 mg Oral Daily  . clopidogrel  300 mg Oral Once  . [START ON 07/16/2020] clopidogrel  75 mg Oral Daily  . folic acid  1 mg Oral Daily  . insulin regular human CONCENTRATED  5-20 Units Subcutaneous BID WC  . polycarbophil  625 mg Oral Daily  . vitamin B-12  1,000  mcg Oral Daily  . vitamin E  400 Units Oral Daily   Continuous Infusions: . sodium chloride 100 mL/hr at 07/15/20 0200     LOS: 0 days   Time spent: 33min   Danial Hlavac C Emelina Hinch, DO [facility]  If 7PM-7AM, please contact night-coverage [email or web address]  07/15/2020, 7:51 AM

## 2020-07-16 ENCOUNTER — Inpatient Hospital Stay (HOSPITAL_COMMUNITY): Payer: PPO | Admitting: Certified Registered Nurse Anesthetist

## 2020-07-16 ENCOUNTER — Encounter (HOSPITAL_COMMUNITY): Payer: Self-pay | Admitting: Internal Medicine

## 2020-07-16 ENCOUNTER — Encounter (HOSPITAL_COMMUNITY)
Admission: EM | Disposition: A | Discharge status: Home / Self Care | Payer: Self-pay | Source: Home / Self Care | Attending: Internal Medicine

## 2020-07-16 DIAGNOSIS — E7841 Elevated Lipoprotein(a): Secondary | ICD-10-CM

## 2020-07-16 DIAGNOSIS — E1149 Type 2 diabetes mellitus with other diabetic neurological complication: Secondary | ICD-10-CM | POA: Diagnosis not present

## 2020-07-16 DIAGNOSIS — I10 Essential (primary) hypertension: Secondary | ICD-10-CM | POA: Diagnosis not present

## 2020-07-16 DIAGNOSIS — G459 Transient cerebral ischemic attack, unspecified: Secondary | ICD-10-CM | POA: Diagnosis not present

## 2020-07-16 DIAGNOSIS — Z794 Long term (current) use of insulin: Secondary | ICD-10-CM | POA: Diagnosis not present

## 2020-07-16 HISTORY — PX: ENDARTERECTOMY: SHX5162

## 2020-07-16 LAB — COMPREHENSIVE METABOLIC PANEL
ALT: 40 U/L (ref 0–44)
AST: 43 U/L — ABNORMAL HIGH (ref 15–41)
Albumin: 3.5 g/dL (ref 3.5–5.0)
Alkaline Phosphatase: 61 U/L (ref 38–126)
Anion gap: 11 (ref 5–15)
BUN: 20 mg/dL (ref 8–23)
CO2: 26 mmol/L (ref 22–32)
Calcium: 9.3 mg/dL (ref 8.9–10.3)
Chloride: 95 mmol/L — ABNORMAL LOW (ref 98–111)
Creatinine, Ser: 1.65 mg/dL — ABNORMAL HIGH (ref 0.44–1.00)
GFR, Estimated: 33 mL/min — ABNORMAL LOW (ref >60)
Glucose, Bld: 70 mg/dL (ref 70–99)
Potassium: 3.4 mmol/L — ABNORMAL LOW (ref 3.5–5.1)
Sodium: 132 mmol/L — ABNORMAL LOW (ref 135–145)
Total Bilirubin: 1.1 mg/dL (ref 0.3–1.2)
Total Protein: 6.3 g/dL — ABNORMAL LOW (ref 6.5–8.1)

## 2020-07-16 LAB — GLUCOSE, CAPILLARY
Glucose-Capillary: 106 mg/dL — ABNORMAL HIGH (ref 70–99)
Glucose-Capillary: 117 mg/dL — ABNORMAL HIGH (ref 70–99)
Glucose-Capillary: 162 mg/dL — ABNORMAL HIGH (ref 70–99)
Glucose-Capillary: 215 mg/dL — ABNORMAL HIGH (ref 70–99)
Glucose-Capillary: 258 mg/dL — ABNORMAL HIGH (ref 70–99)
Glucose-Capillary: 266 mg/dL — ABNORMAL HIGH (ref 70–99)
Glucose-Capillary: 279 mg/dL — ABNORMAL HIGH (ref 70–99)
Glucose-Capillary: 319 mg/dL — ABNORMAL HIGH (ref 70–99)
Glucose-Capillary: 369 mg/dL — ABNORMAL HIGH (ref 70–99)
Glucose-Capillary: 46 mg/dL — ABNORMAL LOW (ref 70–99)

## 2020-07-16 LAB — TYPE AND SCREEN
ABO/RH(D): AB POS
Antibody Screen: NEGATIVE

## 2020-07-16 LAB — CBC
HCT: 37.9 % (ref 36.0–46.0)
HCT: 39.8 % (ref 36.0–46.0)
Hemoglobin: 13.1 g/dL (ref 12.0–15.0)
Hemoglobin: 13.6 g/dL (ref 12.0–15.0)
MCH: 31.6 pg (ref 26.0–34.0)
MCH: 30.8 pg (ref 26.0–34.0)
MCHC: 34.6 g/dL (ref 30.0–36.0)
MCHC: 34.2 g/dL (ref 30.0–36.0)
MCV: 91.3 fL (ref 80.0–100.0)
MCV: 90.2 fL (ref 80.0–100.0)
Platelets: 224 10*3/uL (ref 150–400)
Platelets: 229 10*3/uL (ref 150–400)
RBC: 4.15 MIL/uL (ref 3.87–5.11)
RBC: 4.41 MIL/uL (ref 3.87–5.11)
RDW: 13.4 % (ref 11.5–15.5)
RDW: 13.4 % (ref 11.5–15.5)
WBC: 11.8 10*3/uL — ABNORMAL HIGH (ref 4.0–10.5)
WBC: 14.5 10*3/uL — ABNORMAL HIGH (ref 4.0–10.5)
nRBC: 0 % (ref 0.0–0.2)
nRBC: 0 % (ref 0.0–0.2)

## 2020-07-16 LAB — HEMOGLOBIN A1C
Hgb A1c MFr Bld: 9 % — ABNORMAL HIGH (ref 4.8–5.6)
Mean Plasma Glucose: 212 mg/dL

## 2020-07-16 LAB — CREATININE, SERUM
Creatinine, Ser: 1.61 mg/dL — ABNORMAL HIGH (ref 0.44–1.00)
GFR, Estimated: 34 mL/min — ABNORMAL LOW (ref >60)

## 2020-07-16 LAB — SURGICAL PCR SCREEN
MRSA, PCR: NEGATIVE
Staphylococcus aureus: NEGATIVE

## 2020-07-16 SURGERY — ENDARTERECTOMY, CAROTID
Anesthesia: General | Laterality: Left

## 2020-07-16 MED ORDER — VANCOMYCIN HCL 1500 MG/300ML IV SOLN
1500.0000 mg | Freq: Once | INTRAVENOUS | Status: AC
  Administered 2020-07-16: 1500 mg via INTRAVENOUS
  Filled 2020-07-16: qty 300

## 2020-07-16 MED ORDER — PHENYLEPHRINE 40 MCG/ML (10ML) SYRINGE FOR IV PUSH (FOR BLOOD PRESSURE SUPPORT)
PREFILLED_SYRINGE | INTRAVENOUS | Status: AC
  Filled 2020-07-16: qty 10

## 2020-07-16 MED ORDER — OXYCODONE HCL 5 MG/5ML PO SOLN: 5.0000 mg | Freq: Once | ORAL | Status: DC | PRN

## 2020-07-16 MED ORDER — ROCURONIUM BROMIDE 10 MG/ML (PF) SYRINGE
PREFILLED_SYRINGE | INTRAVENOUS | Status: DC | PRN
  Administered 2020-07-16: 60 mg via INTRAVENOUS
  Administered 2020-07-16: 20 mg via INTRAVENOUS

## 2020-07-16 MED ORDER — PROMETHAZINE HCL 25 MG/ML IJ SOLN: 6.2500 mg | INTRAMUSCULAR | Status: DC | PRN

## 2020-07-16 MED ORDER — ONDANSETRON HCL 4 MG/2ML IJ SOLN
INTRAMUSCULAR | Status: AC
  Filled 2020-07-16: qty 2

## 2020-07-16 MED ORDER — FENTANYL CITRATE (PF) 250 MCG/5ML IJ SOLN
INTRAMUSCULAR | Status: AC
  Filled 2020-07-16: qty 5

## 2020-07-16 MED ORDER — PROTAMINE SULFATE 10 MG/ML IV SOLN
INTRAVENOUS | Status: AC
  Filled 2020-07-16: qty 10

## 2020-07-16 MED ORDER — GUAIFENESIN-DM 100-10 MG/5ML PO SYRP: 15.0000 mL | ORAL_SOLUTION | ORAL | Status: DC | PRN

## 2020-07-16 MED ORDER — SODIUM CHLORIDE 0.9 % IV SOLN
INTRAVENOUS | Status: DC | PRN
  Administered 2020-07-16: 500 mL

## 2020-07-16 MED ORDER — FENTANYL CITRATE (PF) 100 MCG/2ML IJ SOLN
25.0000 ug | INTRAMUSCULAR | Status: DC | PRN
  Administered 2020-07-16: 25 ug via INTRAVENOUS

## 2020-07-16 MED ORDER — LABETALOL HCL 5 MG/ML IV SOLN: 10.0000 mg | INTRAVENOUS | Status: DC | PRN

## 2020-07-16 MED ORDER — BISACODYL 5 MG PO TBEC: 5.0000 mg | DELAYED_RELEASE_TABLET | Freq: Every day | ORAL | Status: DC | PRN

## 2020-07-16 MED ORDER — ONDANSETRON HCL 4 MG/2ML IJ SOLN
INTRAMUSCULAR | Status: AC
  Filled 2020-07-16: qty 4

## 2020-07-16 MED ORDER — OXYCODONE HCL 5 MG PO TABS: 5.0000 mg | ORAL_TABLET | Freq: Once | ORAL | Status: DC | PRN

## 2020-07-16 MED ORDER — PROTAMINE SULFATE 10 MG/ML IV SOLN
INTRAVENOUS | Status: DC | PRN
  Administered 2020-07-16: 10 mg via INTRAVENOUS
  Administered 2020-07-16: 20 mg via INTRAVENOUS

## 2020-07-16 MED ORDER — VANCOMYCIN HCL IN DEXTROSE 1-5 GM/200ML-% IV SOLN
1000.0000 mg | INTRAVENOUS | Status: DC
  Filled 2020-07-16: qty 200

## 2020-07-16 MED ORDER — HYDRALAZINE HCL 20 MG/ML IJ SOLN: 5.0000 mg | INTRAMUSCULAR | Status: DC | PRN

## 2020-07-16 MED ORDER — POTASSIUM CHLORIDE CRYS ER 20 MEQ PO TBCR: 20.0000 meq | EXTENDED_RELEASE_TABLET | Freq: Every day | ORAL | Status: DC | PRN

## 2020-07-16 MED ORDER — HEPARIN SODIUM (PORCINE) 1000 UNIT/ML IJ SOLN
INTRAMUSCULAR | Status: AC
  Filled 2020-07-16: qty 2

## 2020-07-16 MED ORDER — OXYCODONE HCL 5 MG PO TABS: 5.0000 mg | ORAL_TABLET | ORAL | Status: DC | PRN

## 2020-07-16 MED ORDER — ALUM & MAG HYDROXIDE-SIMETH 200-200-20 MG/5ML PO SUSP: 15.0000 mL | ORAL | Status: DC | PRN

## 2020-07-16 MED ORDER — INSULIN REGULAR HUMAN (CONC) 500 UNIT/ML ~~LOC~~ SOPN
80.0000 [IU] | PEN_INJECTOR | Freq: Three times a day (TID) | SUBCUTANEOUS | Status: DC
  Administered 2020-07-17: 80 [IU] via SUBCUTANEOUS
  Filled 2020-07-16 (×2): qty 3

## 2020-07-16 MED ORDER — LACTATED RINGERS IV SOLN: INTRAVENOUS | Status: DC | PRN

## 2020-07-16 MED ORDER — MAGNESIUM SULFATE 2 GM/50ML IV SOLN: 2.0000 g | Freq: Every day | INTRAVENOUS | Status: DC | PRN

## 2020-07-16 MED ORDER — LIDOCAINE 2% (20 MG/ML) 5 ML SYRINGE
INTRAMUSCULAR | Status: DC | PRN
  Administered 2020-07-16: 20 mg via INTRAVENOUS

## 2020-07-16 MED ORDER — LACTATED RINGERS IV SOLN: INTRAVENOUS | Status: DC

## 2020-07-16 MED ORDER — ONDANSETRON HCL 4 MG/2ML IJ SOLN: 4.0000 mg | Freq: Four times a day (QID) | INTRAMUSCULAR | Status: DC | PRN

## 2020-07-16 MED ORDER — SUGAMMADEX SODIUM 200 MG/2ML IV SOLN
INTRAVENOUS | Status: DC | PRN
  Administered 2020-07-16: 300 mg via INTRAVENOUS

## 2020-07-16 MED ORDER — LIDOCAINE 2% (20 MG/ML) 5 ML SYRINGE
INTRAMUSCULAR | Status: AC
  Filled 2020-07-16: qty 5

## 2020-07-16 MED ORDER — DEXAMETHASONE SODIUM PHOSPHATE 10 MG/ML IJ SOLN
INTRAMUSCULAR | Status: AC
  Filled 2020-07-16: qty 1

## 2020-07-16 MED ORDER — ONDANSETRON HCL 4 MG/2ML IJ SOLN
INTRAMUSCULAR | Status: DC | PRN
  Administered 2020-07-16: 4 mg via INTRAVENOUS

## 2020-07-16 MED ORDER — SODIUM CHLORIDE 0.9 % IV SOLN: INTRAVENOUS | Status: DC

## 2020-07-16 MED ORDER — HEPARIN SODIUM (PORCINE) 5000 UNIT/ML IJ SOLN
5000.0000 [IU] | Freq: Three times a day (TID) | INTRAMUSCULAR | Status: DC
  Administered 2020-07-16 – 2020-07-17 (×2): 5000 [IU] via SUBCUTANEOUS
  Filled 2020-07-16 (×3): qty 1

## 2020-07-16 MED ORDER — PHENYLEPHRINE 40 MCG/ML (10ML) SYRINGE FOR IV PUSH (FOR BLOOD PRESSURE SUPPORT)
PREFILLED_SYRINGE | INTRAVENOUS | Status: DC | PRN
  Administered 2020-07-16: 80 ug via INTRAVENOUS
  Administered 2020-07-16: 40 ug via INTRAVENOUS
  Administered 2020-07-16: 80 ug via INTRAVENOUS

## 2020-07-16 MED ORDER — HYDROMORPHONE HCL 1 MG/ML IJ SOLN: 0.5000 mg | INTRAMUSCULAR | Status: DC | PRN

## 2020-07-16 MED ORDER — PANTOPRAZOLE SODIUM 40 MG PO TBEC
40.0000 mg | DELAYED_RELEASE_TABLET | Freq: Every day | ORAL | Status: DC
  Filled 2020-07-16 (×2): qty 1

## 2020-07-16 MED ORDER — DEXTROSE 50 % IV SOLN
25.0000 g | INTRAVENOUS | Status: AC
  Administered 2020-07-16: 25 g via INTRAVENOUS

## 2020-07-16 MED ORDER — FLEET ENEMA 7-19 GM/118ML RE ENEM: 1.0000 | ENEMA | Freq: Once | RECTAL | Status: DC | PRN

## 2020-07-16 MED ORDER — DOCUSATE SODIUM 100 MG PO CAPS
100.0000 mg | ORAL_CAPSULE | Freq: Every day | ORAL | Status: DC
  Filled 2020-07-16: qty 1

## 2020-07-16 MED ORDER — PHENOL 1.4 % MT LIQD: 1.0000 | OROMUCOSAL | Status: DC | PRN

## 2020-07-16 MED ORDER — MEPERIDINE HCL 25 MG/ML IJ SOLN: 6.2500 mg | INTRAMUSCULAR | Status: DC | PRN

## 2020-07-16 MED ORDER — PROPOFOL 10 MG/ML IV BOLUS
INTRAVENOUS | Status: AC
  Filled 2020-07-16: qty 20

## 2020-07-16 MED ORDER — MIDAZOLAM HCL 2 MG/2ML IJ SOLN: 0.5000 mg | Freq: Once | INTRAMUSCULAR | Status: DC | PRN

## 2020-07-16 MED ORDER — FENTANYL CITRATE (PF) 100 MCG/2ML IJ SOLN
INTRAMUSCULAR | Status: AC
Start: 2020-07-16 — End: 2020-07-17
  Filled 2020-07-16: qty 2

## 2020-07-16 MED ORDER — DEXAMETHASONE SODIUM PHOSPHATE 10 MG/ML IJ SOLN
INTRAMUSCULAR | Status: AC
  Filled 2020-07-16: qty 2

## 2020-07-16 MED ORDER — SODIUM CHLORIDE 0.9 % IV SOLN: 500.0000 mL | Freq: Once | INTRAVENOUS | Status: DC | PRN

## 2020-07-16 MED ORDER — HEPARIN SODIUM (PORCINE) 1000 UNIT/ML IJ SOLN
INTRAMUSCULAR | Status: DC | PRN
  Administered 2020-07-16: 11000 [IU] via INTRAVENOUS

## 2020-07-16 MED ORDER — 0.9 % SODIUM CHLORIDE (POUR BTL) OPTIME
TOPICAL | Status: DC | PRN
  Administered 2020-07-16: 1000 mL

## 2020-07-16 MED ORDER — LIDOCAINE HCL (PF) 1 % IJ SOLN
INTRAMUSCULAR | Status: AC
  Filled 2020-07-16: qty 30

## 2020-07-16 MED ORDER — INSULIN ASPART 100 UNIT/ML ~~LOC~~ SOLN
0.0000 [IU] | Freq: Three times a day (TID) | SUBCUTANEOUS | Status: DC
  Administered 2020-07-16 – 2020-07-17 (×3): 5 [IU] via SUBCUTANEOUS
  Filled 2020-07-16: qty 0.09

## 2020-07-16 MED ORDER — PHENYLEPHRINE HCL-NACL 10-0.9 MG/250ML-% IV SOLN
INTRAVENOUS | Status: DC | PRN
  Administered 2020-07-16: 50 ug/min via INTRAVENOUS

## 2020-07-16 MED ORDER — CHLORHEXIDINE GLUCONATE 0.12 % MT SOLN: 15.0000 mL | OROMUCOSAL | Status: AC

## 2020-07-16 MED ORDER — SODIUM CHLORIDE 0.9 % IV SOLN
INTRAVENOUS | Status: AC
  Filled 2020-07-16: qty 1.2

## 2020-07-16 MED ORDER — CHLORHEXIDINE GLUCONATE 0.12 % MT SOLN
OROMUCOSAL | Status: AC
  Filled 2020-07-16: qty 15

## 2020-07-16 MED ORDER — PROPOFOL 10 MG/ML IV BOLUS
INTRAVENOUS | Status: DC | PRN
  Administered 2020-07-16: 130 mg via INTRAVENOUS

## 2020-07-16 MED ORDER — FENTANYL CITRATE (PF) 100 MCG/2ML IJ SOLN
INTRAMUSCULAR | Status: DC | PRN
  Administered 2020-07-16: 200 ug via INTRAVENOUS
  Administered 2020-07-16 (×2): 50 ug via INTRAVENOUS

## 2020-07-16 MED ORDER — DEXAMETHASONE SODIUM PHOSPHATE 10 MG/ML IJ SOLN
INTRAMUSCULAR | Status: DC | PRN
  Administered 2020-07-16: 5 mg via INTRAVENOUS

## 2020-07-16 MED ORDER — DEXTROSE 50 % IV SOLN
INTRAVENOUS | Status: AC
  Filled 2020-07-16: qty 50

## 2020-07-16 SURGICAL SUPPLY — 47 items
CANISTER SUCT 3000ML PPV (MISCELLANEOUS) ×3 IMPLANT
CANNULA VESSEL 3MM 2 BLNT TIP (CANNULA) ×6 IMPLANT
CATH ROBINSON RED A/P 18FR (CATHETERS) ×3 IMPLANT
CLIP LIGATING EXTRA MED SLVR (CLIP) ×3 IMPLANT
CLIP LIGATING EXTRA SM BLUE (MISCELLANEOUS) ×3 IMPLANT
COVER WAND RF STERILE (DRAPES) ×1 IMPLANT
DECANTER SPIKE VIAL GLASS SM (MISCELLANEOUS) IMPLANT
DERMABOND ADVANCED (GAUZE/BANDAGES/DRESSINGS) ×2
DERMABOND ADVANCED .7 DNX12 (GAUZE/BANDAGES/DRESSINGS) ×1 IMPLANT
DRAIN HEMOVAC 1/8 X 5 (WOUND CARE) IMPLANT
ELECT REM PT RETURN 9FT ADLT (ELECTROSURGICAL) ×3
ELECTRODE REM PT RTRN 9FT ADLT (ELECTROSURGICAL) ×1 IMPLANT
EVACUATOR SILICONE 100CC (DRAIN) IMPLANT
GLOVE BIO SURGEON STRL SZ 6.5 (GLOVE) ×1 IMPLANT
GLOVE BIO SURGEONS STRL SZ 6.5 (GLOVE) ×1
GLOVE BIOGEL PI IND STRL 6.5 (GLOVE) IMPLANT
GLOVE BIOGEL PI IND STRL 7.0 (GLOVE) IMPLANT
GLOVE BIOGEL PI IND STRL 7.5 (GLOVE) IMPLANT
GLOVE BIOGEL PI INDICATOR 6.5 (GLOVE) ×2
GLOVE BIOGEL PI INDICATOR 7.0 (GLOVE) ×2
GLOVE BIOGEL PI INDICATOR 7.5 (GLOVE) ×2
GLOVE ECLIPSE 7.0 STRL STRAW (GLOVE) ×2 IMPLANT
GLOVE SS BIOGEL STRL SZ 7.5 (GLOVE) ×1 IMPLANT
GLOVE SUPERSENSE BIOGEL SZ 7.5 (GLOVE) ×2
GOWN STRL REUS W/ TWL LRG LVL3 (GOWN DISPOSABLE) ×3 IMPLANT
GOWN STRL REUS W/TWL LRG LVL3 (GOWN DISPOSABLE) ×12
KIT BASIN OR (CUSTOM PROCEDURE TRAY) ×3 IMPLANT
KIT SHUNT ARGYLE CAROTID ART 6 (VASCULAR PRODUCTS) IMPLANT
KIT TURNOVER KIT B (KITS) ×3 IMPLANT
NEEDLE 22X1 1/2 (OR ONLY) (NEEDLE) IMPLANT
NS IRRIG 1000ML POUR BTL (IV SOLUTION) ×6 IMPLANT
PACK CAROTID (CUSTOM PROCEDURE TRAY) ×3 IMPLANT
PAD ARMBOARD 7.5X6 YLW CONV (MISCELLANEOUS) ×6 IMPLANT
PATCH HEMASHIELD 8X75 (Vascular Products) ×2 IMPLANT
POSITIONER HEAD DONUT 9IN (MISCELLANEOUS) ×1 IMPLANT
SHUNT CAROTID BYPASS 10 (VASCULAR PRODUCTS) ×2 IMPLANT
SHUNT CAROTID BYPASS 12FRX15.5 (VASCULAR PRODUCTS) IMPLANT
SUT ETHILON 3 0 PS 1 (SUTURE) IMPLANT
SUT PROLENE 6 0 CC (SUTURE) ×5 IMPLANT
SUT SILK 3 0 (SUTURE)
SUT SILK 3-0 18XBRD TIE 12 (SUTURE) IMPLANT
SUT VIC AB 3-0 SH 27 (SUTURE) ×6
SUT VIC AB 3-0 SH 27X BRD (SUTURE) ×2 IMPLANT
SUT VICRYL 4-0 PS2 18IN ABS (SUTURE) ×1 IMPLANT
SYR CONTROL 10ML LL (SYRINGE) IMPLANT
TOWEL GREEN STERILE (TOWEL DISPOSABLE) ×3 IMPLANT
WATER STERILE IRR 1000ML POUR (IV SOLUTION) ×3 IMPLANT

## 2020-07-16 NOTE — Transfer of Care (Signed)
Immediate Anesthesia Transfer of Care Note  Patient: Kristine Gray  Procedure(s) Performed: ENDARTERECTOMY CAROTID (Left )  Patient Location: PACU  Anesthesia Type:General  Level of Consciousness: awake, alert  and oriented  Airway & Oxygen Therapy: Patient Spontanous Breathing and Patient connected to face mask oxygen  Post-op Assessment: Report given to RN, Post -op Vital signs reviewed and stable, Patient moving all extremities X 4 and Patient able to stick tongue midline  Post vital signs: Reviewed and stable  Last Vitals:  Vitals Value Taken Time  BP 115/54 07/16/20 1319  Temp    Pulse 82 07/16/20 1321  Resp 12 07/16/20 1321  SpO2 98 % 07/16/20 1321  Vitals shown include unvalidated device data.  Last Pain:  Vitals:   07/16/20 0731  TempSrc: Oral  PainSc:          Complications: No complications documented.

## 2020-07-16 NOTE — Anesthesia Preprocedure Evaluation (Addendum)
Anesthesia Evaluation  Patient identified by MRN, date of birth, ID band Patient awake    Reviewed: Allergy & Precautions, NPO status , Patient's Chart, lab work & pertinent test results, reviewed documented beta blocker date and time   History of Anesthesia Complications Negative for: history of anesthetic complications  Airway Mallampati: II  TM Distance: >3 FB Neck ROM: Full    Dental  (+) Dental Advisory Given   Pulmonary  07/14/2020 SARS coronavirus NEG   breath sounds clear to auscultation       Cardiovascular hypertension, Pt. on medications and Pt. on home beta blockers (-) angina+ Peripheral Vascular Disease   Rhythm:Regular Rate:Normal  07/15/2020 ECHO: EF 70-75%, mod LVH, trivial MR   Neuro/Psych TIA   GI/Hepatic Neg liver ROS, GERD  Controlled,  Endo/Other  diabetes (glu 162), Insulin DependentMorbid obesity  Renal/GU Renal InsufficiencyRenal disease (creat 1.65)     Musculoskeletal  (+) Arthritis ,   Abdominal (+) + obese,   Peds  Hematology   Anesthesia Other Findings   Reproductive/Obstetrics                            Anesthesia Physical Anesthesia Plan  ASA: III  Anesthesia Plan: General   Post-op Pain Management:    Induction: Intravenous  PONV Risk Score and Plan: 3 and Ondansetron and Dexamethasone  Airway Management Planned: Oral ETT  Additional Equipment: Arterial line  Intra-op Plan:   Post-operative Plan: Extubation in OR  Informed Consent: I have reviewed the patients History and Physical, chart, labs and discussed the procedure including the risks, benefits and alternatives for the proposed anesthesia with the patient or authorized representative who has indicated his/her understanding and acceptance.     Dental advisory given  Plan Discussed with: CRNA and Surgeon  Anesthesia Plan Comments:        Anesthesia Quick Evaluation

## 2020-07-16 NOTE — Progress Notes (Signed)
Patient arrived to room 4E19. CHG and skin assessment completed. Patient placed on cardiac monitor and CCMD notified. Vital signs taken and charted. Call light and phone within patient reach. Will continue to monitor.   -Donnelly Angelica, RN

## 2020-07-16 NOTE — OR Nursing (Signed)
Post-procedure patient reassessed:  Patient able to follow commands, tongue midline, strong and equal strength bilateral hands and legs.

## 2020-07-16 NOTE — Progress Notes (Signed)
Hypoglycemic Event  CBG: Results for Kristine Gray, Kristine Gray (MRN [id number]) as of 07/16/2020 05:21  Ref. Range 07/16/2020 04:09  Glucose-Capillary Latest Ref Range: 70 - 99 mg/dL 46 (L)    Treatment: D50 50 mL (25 gm)  Symptoms: Sweaty  Follow-up CBG: [id number] CBG Result:106  Possible Reasons for Event: Unknown  Comments/MD notified:T. Opyd    Dionisio David, RN

## 2020-07-16 NOTE — Progress Notes (Signed)
STROKE TEAM PROGRESS NOTE   INTERVAL HISTORY Pt seen after CEA procedure. She is lying in bed, AAO x 3. No acute distress. Left neck wound dry and clean. Moving all extremities.  Vitals:   07/16/20 0417 07/16/20 0731 07/16/20 0937 07/16/20 1021  BP: (!) 176/64 (!) 168/76 (!) 149/80   Pulse: 77 72    Resp: 19 16 18    Temp: (!) 97.4 F (36.3 C) (!) 97.5 F (36.4 C) 97.7 F (36.5 C)   TempSrc: Oral Oral    SpO2: 99% 100% 98%   Weight:    125.8 kg  Height:    5\' 4"  (1.626 m)   CBC:  Recent Labs  Lab 07/14/20 1635 07/16/20 0125  WBC 12.8* 11.8*  NEUTROABS 5.8  --   HGB 15.4* 13.1  HCT 44.5 37.9  MCV 89.4 91.3  PLT 255 403   Basic Metabolic Panel:  Recent Labs  Lab 07/14/20 1635 07/16/20 0125  NA 128* 132*  K 3.5 3.4*  CL 89* 95*  CO2 25 26  GLUCOSE 167* 70  BUN 19 20  CREATININE 1.61* 1.65*  CALCIUM 9.5 9.3   Lipid Panel:  Recent Labs  Lab 07/15/20 0242  CHOL 203*  TRIG 139  HDL 52  CHOLHDL 3.9  VLDL 28  LDLCALC 123*   HgbA1c:  Recent Labs  Lab 07/15/20 0242  HGBA1C 9.0*   Urine Drug Screen:  Recent Labs  Lab 07/14/20 2158  LABOPIA NONE DETECTED  COCAINSCRNUR NONE DETECTED  LABBENZ NONE DETECTED  AMPHETMU NONE DETECTED  THCU NONE DETECTED  LABBARB NONE DETECTED    Alcohol Level  Recent Labs  Lab 07/14/20 1635  ETH <10    IMAGING past 24 hours ECHOCARDIOGRAM COMPLETE  Result Date: 07/15/2020    ECHOCARDIOGRAM REPORT   Patient Name:   Kristine Gray Date of Exam: 07/15/2020 Medical Rec #:  [id number]          Height:       64.0 in Accession #:    [id number]         Weight:       277.3 lb Date of Birth:  08/18/1951          BSA:          2.248 m Patient Age:    3 years           BP:           111/51 mmHg Patient Gender: F                  HR:           76 bpm. Exam Location:  Inpatient Procedure: 2D Echo, Cardiac Doppler and Color Doppler Indications:    Stroke 434.91 / I163.9  History:        Patient has no prior history of  Echocardiogram examinations.                 Risk Factors:Hypertension and Diabetes.  Sonographer:    Bernadene Person RDCS Referring Phys: [id number] AMY N [facility] IMPRESSIONS  1. Left ventricular ejection fraction, by estimation, is 70 to 75%. The left ventricle has hyperdynamic function. The left ventricle has no regional wall motion abnormalities. There is moderate concentric left ventricular hypertrophy. Left ventricular diastolic parameters are consistent with Grade I diastolic dysfunction (impaired relaxation). Elevated left atrial pressure.  2. Right ventricular systolic function is normal. The right ventricular size is normal.  3. The pericardial effusion is posterior to  the left ventricle. There is no evidence of cardiac tamponade.  4. The mitral valve is normal in structure. Trivial mitral valve regurgitation. No evidence of mitral stenosis.  5. The aortic valve is normal in structure. Aortic valve regurgitation is not visualized. No aortic stenosis is present.  6. The inferior vena cava is normal in size with greater than 50% respiratory variability, suggesting right atrial pressure of 3 mmHg. Comparison(s): No prior Echocardiogram. Conclusion(s)/Recommendation(s): No intracardiac source of embolism detected on this transthoracic study. A transesophageal echocardiogram is recommended to exclude cardiac source of embolism if clinically indicated. FINDINGS  Left Ventricle: Left ventricular ejection fraction, by estimation, is 70 to 75%. The left ventricle has hyperdynamic function. The left ventricle has no regional wall motion abnormalities. The left ventricular internal cavity size was normal in size. There is moderate concentric left ventricular hypertrophy. Left ventricular diastolic parameters are consistent with Grade I diastolic dysfunction (impaired relaxation). Elevated left atrial pressure. Right Ventricle: The right ventricular size is normal. No increase in right ventricular wall thickness. Right  ventricular systolic function is normal. Left Atrium: Left atrial size was normal in size. Right Atrium: Right atrial size was normal in size. Pericardium: Trivial pericardial effusion is present. The pericardial effusion is posterior to the left ventricle. There is no evidence of cardiac tamponade. Mitral Valve: The mitral valve is normal in structure. Trivial mitral valve regurgitation. No evidence of mitral valve stenosis. Tricuspid Valve: The tricuspid valve is normal in structure. Tricuspid valve regurgitation is trivial. No evidence of tricuspid stenosis. Aortic Valve: The aortic valve is normal in structure. Aortic valve regurgitation is not visualized. No aortic stenosis is present. Pulmonic Valve: The pulmonic valve was normal in structure. Pulmonic valve regurgitation is not visualized. No evidence of pulmonic stenosis. Aorta: The aortic root is normal in size and structure. Venous: The inferior vena cava is normal in size with greater than 50% respiratory variability, suggesting right atrial pressure of 3 mmHg. IAS/Shunts: No atrial level shunt detected by color flow Doppler.  LEFT VENTRICLE PLAX 2D LVIDd:         4.30 cm  Diastology LVIDs:         2.40 cm  LV e' medial:    5.87 cm/s LV PW:         1.30 cm  LV E/e' medial:  11.9 LV IVS:        1.20 cm  LV e' lateral:   6.97 cm/s LVOT diam:     2.10 cm  LV E/e' lateral: 10.0 LV SV:         79 LV SV Index:   35 LVOT Area:     3.46 cm  RIGHT VENTRICLE RV S prime:     12.50 cm/s TAPSE (M-mode): 2.1 cm LEFT ATRIUM             Index       RIGHT ATRIUM           Index LA diam:        3.50 cm 1.56 cm/m  RA Area:     10.80 cm LA Vol (A2C):   53.2 ml 23.67 ml/m RA Volume:   19.80 ml  8.81 ml/m LA Vol (A4C):   52.0 ml 23.13 ml/m LA Biplane Vol: 52.5 ml 23.35 ml/m  AORTIC VALVE LVOT Vmax:   96.60 cm/s LVOT Vmean:  66.100 cm/s LVOT VTI:    0.229 m  AORTA Ao Root diam: 3.30 cm Ao Asc diam:  2.90 cm MITRAL VALVE  MV Area (PHT): 2.62 cm    SHUNTS MV Decel Time:  289 msec    Systemic VTI:  0.23 m MV E velocity: 69.80 cm/s  Systemic Diam: 2.10 cm MV A velocity: 81.80 cm/s MV E/A ratio:  0.85 Ena Dawley MD Electronically signed by Ena Dawley MD Signature Date/Time: 07/15/2020/8:26:49 PM    Final     PHYSICAL EXAM   Temp:  [97.4 F (36.3 C)-98.2 F (36.8 C)] 97.7 F (36.5 C) (10/22 0937) Pulse Rate:  [72-81] 72 (10/22 0731) Resp:  [16-19] 18 (10/22 0937) BP: (117-176)/(47-80) 149/80 (10/22 0937) SpO2:  [98 %-100 %] 98 % (10/22 0937) Weight:  [125.8 kg] 125.8 kg (10/22 1021)  General - morbid obesity, well developed, in no apparent distress.  Ophthalmologic - fundi not visualized due to noncooperation.  Cardiovascular - Regular rate and rhythm.  Mental Status -  Level of arousal and orientation to time, place, and person were intact. Language including expression, naming, repetition, comprehension was assessed and found intact. Fund of Knowledge was assessed and was intact.  Cranial Nerves II - XII - II - Visual field intact OU. III, IV, VI - Extraocular movements intact. V - Facial sensation intact bilaterally. VII - Facial movement intact bilaterally. VIII - Hearing & vestibular intact bilaterally. X - Palate elevates symmetrically. XI - Chin turning & shoulder shrug intact bilaterally. XII - Tongue protrusion intact.  Motor Strength - The patient's strength was normal in all extremities and pronator drift was absent.  Bulk was normal and fasciculations were absent.   Motor Tone - Muscle tone was assessed at the neck and appendages and was normal.  Reflexes - The patient's reflexes were symmetrical in all extremities and she had no pathological reflexes.  Sensory - Light touch, temperature/pinprick were assessed and were symmetrical.    Coordination - The patient had normal movements in the hands with no ataxia or dysmetria.  Tremor was absent.  Gait and Station - deferred.   ASSESSMENT/PLAN Kristine Gray is a 69 y.o. female with history of HTN, DM, renal calculi, diverticulosis, arthritis and amenia who presented to [place] 07/14/20 with transient acute R hand sensory numbness which progressed to acute onset aphasia - slurred speech and difficulty getting her words out. She did not receive IV t-PA.   L brain TIA d/t symptomatic L ICA stenosis   Code Stroke CT head No acute abnormality. Moderate Small vessel disease.   CTA head & neck no ELVO. B ICA bulky plaque 70%. Moderate short proximal R P2 stenosis. Fetal L PCA.  MRI  No acute abnormality. Moderate Small vessel disease.   2D Echo EF 70-75%. No source of embolus   LDL 123  HgbA1c 9.0  VTE prophylaxis - SCDs   aspirin 162mg  daily prior to admission, now on aspirin 325 mg daily and clopidogrel 75 mg daily following plavix load. Continue DAPT for 3 weeks and the plavix alone if OK with VVS.   Therapy recommendations:  No therapy needs  Disposition:  Return home  Carotid Stenosis s/p left CEA   CTA neck B ICA bulky plaque 70%.   Symptomatic L ICA stenosis   VVS on board  Dr. Donnetta Hutching did left CEA 10/22  Post op doing well  Hypertension  Stable . Permissive hypertension (OK if < 220/120) but gradually normalize in 5-7 days . Long-term BP goal 130-150 given R ICA stenosis   Hyperlipidemia  Home meds:  lipitor 20  Now on lipitor 80  LDL 123, goal < 70  Continue statin at discharge  Diabetes type II Uncontrolled  HgbA1c 9.0, goal < 7.0  CBGs   SSI  Close PCP follow-up for better DM control  Other Stroke Risk Factors  Advanced age  Morbid Obesity, Body mass index is 47.61 kg/m., recommend weight loss, diet and exercise as appropriate   Other Active Problems  Nausea and vomiting w/ leukocytosis, WBC 12.8->11.8 (afebrile)  Hypokalemia, K 3.4  AKI / CKD, Cre 1.61->1.65  Hospital day # 1  Neurology will sign off. Please call with questions. Pt will follow up with stroke clinic  NP at [facility] in about 4 weeks. Thanks for the consult.  Kristine Hawking, MD PhD Stroke Neurology 07/16/2020 5:29 PM  To contact Stroke Continuity provider, please refer to [email or web address] After hours, contact General Neurology

## 2020-07-16 NOTE — OR Nursing (Signed)
Patient assessed pre-op.  Patient alert and oriented and able to follow commands.  Tongue midline; strong, equal strength bilaterally in arms and legs.  Will assess post-op.

## 2020-07-16 NOTE — Plan of Care (Signed)
Pt alert and oriented. Tele in place. Hypoglycemic event overnight in notes. Pt has been NPO since midnight. Consent form for surgery at bedside.   Problem: Education: Goal: Knowledge of General Education information will improve Description: Including pain rating scale, medication(s)/side effects and non-pharmacologic comfort measures Outcome: Progressing   Problem: Health Behavior/Discharge Planning: Goal: Ability to manage health-related needs will improve Outcome: Progressing   Problem: Clinical Measurements: Goal: Ability to maintain clinical measurements within normal limits will improve Outcome: Progressing Goal: Will remain free from infection Outcome: Progressing   Problem: Activity: Goal: Risk for activity intolerance will decrease Outcome: Progressing   Problem: Nutrition: Goal: Adequate nutrition will be maintained Outcome: Progressing   Problem: Elimination: Goal: Will not experience complications related to bowel motility Outcome: Progressing   Problem: Pain Managment: Goal: General experience of comfort will improve Outcome: Progressing   Problem: Safety: Goal: Ability to remain free from injury will improve Outcome: Progressing   Problem: Skin Integrity: Goal: Risk for impaired skin integrity will decrease Outcome: Progressing   Problem: Education: Goal: Knowledge of disease or condition will improve Outcome: Progressing Goal: Knowledge of secondary prevention will improve Outcome: Progressing Goal: Knowledge of patient specific risk factors addressed and post discharge goals established will improve Outcome: Progressing   Problem: Coping: Goal: Will verbalize positive feelings about self Outcome: Progressing   Problem: Health Behavior/Discharge Planning: Goal: Ability to manage health-related needs will improve Outcome: Progressing   Problem: Self-Care: Goal: Ability to participate in self-care as condition permits will improve Outcome:  Progressing Goal: Verbalization of feelings and concerns over difficulty with self-care will improve Outcome: Progressing Goal: Ability to communicate needs accurately will improve Outcome: Progressing   Problem: Nutrition: Goal: Risk of aspiration will decrease Outcome: Progressing   Problem: Ischemic Stroke/TIA Tissue Perfusion: Goal: Complications of ischemic stroke/TIA will be minimized Outcome: Progressing

## 2020-07-16 NOTE — Anesthesia Postprocedure Evaluation (Signed)
Anesthesia Post Note  Patient: [name]  Procedure(s) Performed: ENDARTERECTOMY CAROTID (Left )     Patient location during evaluation: PACU Anesthesia Type: General Level of consciousness: awake and alert, patient cooperative and oriented Pain management: pain level controlled Vital Signs Assessment: post-procedure vital signs reviewed and stable Respiratory status: spontaneous breathing, nonlabored ventilation, respiratory function stable and patient connected to nasal cannula oxygen Cardiovascular status: blood pressure returned to baseline and stable Postop Assessment: no apparent nausea or vomiting Anesthetic complications: no   No complications documented.  Last Vitals:  Vitals:   07/16/20 0937 07/16/20 1320  BP: (!) 149/80 (!) 115/54  Pulse:  82  Resp: 18 17  Temp: 36.5 C 36.8 C  SpO2: 98% 99%    Last Pain:  Vitals:   07/16/20 0731  TempSrc: Oral  PainSc:                  Elim Economou,E. Randle Shatzer

## 2020-07-16 NOTE — Anesthesia Procedure Notes (Signed)
Arterial Line Insertion Start/End10/22/2021 10:35 AM Performed by: Candis Shine, CRNA, CRNA  Patient location: Pre-op. Preanesthetic checklist: patient identified, IV checked, site marked, risks and benefits discussed, surgical consent, monitors and equipment checked, pre-op evaluation, timeout performed and anesthesia consent Lidocaine 1% used for infiltration Right, radial was placed Catheter size: 20 G Hand hygiene performed  and maximum sterile barriers used   Attempts: 1 Procedure performed without using ultrasound guided technique. Following insertion, dressing applied and Biopatch. Post procedure assessment: normal and unchanged  Patient tolerated the procedure well with no immediate complications.

## 2020-07-16 NOTE — Op Note (Signed)
   OPERATIVE REPORT  DATE OF SURGERY: 07/16/2020  PATIENT: Kristine Gray, 69 y.o. female MRN: [id number]  DOB: 03-08-1951  PRE-OPERATIVE DIAGNOSIS: Left Carotid Stenosis, Symptomatic  POST-OPERATIVE DIAGNOSIS:  Same  PROCEDURE:  Left Carotid Endarterectomy with Dacron Patch Angioplasty  SURGEON:  Curt Jews, M.D.  PHYSICIAN ASSISTANT: [place]  The assistant was needed for exposure and to expedite the case  ANESTHESIA:   general  EBL: Less than 200 ml  Total I/O In: -  Out: 75 [Blood:75]  BLOOD ADMINISTERED: none  DRAINS: none   SPECIMEN: none  COUNTS CORRECT:  YES  PLAN OF CARE: Admit to inpatient   PATIENT DISPOSITION:  PACU - hemodynamically stable and neurologically intact.  PROCEDURE DETAILS: The patient was taken to the operating room placed in supine position.  General anesthesia was administered.  The neck was prepped and draped in the usual sterile fashion.  An incision was made anterior to the sternocleidomastoid and carried down through the platysma with electrocautery.  The sternocleidomastoid was reflected posteriorly and the carotid sheath was opened.  The facial vein was ligated with 2-0 silk ties and divided.  The common carotid artery was encircled with an umbilical tape and Rummel tourniquet.  The vagus nerve was identified and preserved.  Dissection was continued onto the carotid bifurcation.  The superior thyroid artery was encircled with a 2-0 silk Potts tie.  The external carotid was encircled with a blue vessel loop and the internal carotid was encircled with an umbilical tape and Rummel tourniquet.  The hypoglossal nerve was identified and preserved.  The patient was given systemic heparin and after adequate circulation time, the internal, external and common carotid arteries were occluded with vascular clamps.  The common carotid artery was opened with an 11 blade and extended  longitudinally with Potts scissors.  A 10 shunt was passed up the  internal carotid and allowed to backbleed.  It was then passed down the common carotid where it was secured with Rummel tourniquet.  The endarterectomy was begun on the common carotid artery and the plaque was divided proximally with Potts scissors.  The endarterectomy was continued onto the bifurcation.  The external carotid was endarterectomized with an eversion technique and the internal carotid was endarterectomized in an open fashion.  Remaining atheromatous debris was removed from the endarterectomy plane.  A Finesse Hemashield Dacron patch was brought onto the field and was sewn as a patch angioplasty with a running 6-0 Prolene suture.  Prior to completion of the closure the shunt was removed and the usual flushing maneuvers were undertaken.  The anastomosis was completed and flow was restored first to the external and then the internal carotid artery.  Excellent flow characteristics were noted with hand-held Doppler in the internal and external carotid arteries.  The patient was given 50 mg of protamine to reverse the heparin.  The wounds were irrigated with saline.  Hemostasis was obtained with electrocautery.  The wounds were closed with 3-0 Vicryl to reapproximate the sternocleidomastoid over the carotid sheath.  The platysma was lysed with a running 3-0 Vicryl suture.  The skin was closed with a 4-0 subcuticular Vicryl stitch.  Dermabond was applied.  The patient was awakened neurologically intact in the operating room and transferred to the recovery room in stable condition   Curt Jews, M.D. 07/16/2020 1:20 PM

## 2020-07-16 NOTE — Discharge Instructions (Signed)
   [facility]  Discharge Instructions   Carotid Endarterectomy (CEA)  Please refer to the following instructions for your post-procedure care. Your surgeon or physician assistant will discuss any changes with you.  Activity  You are encouraged to walk as much as you can. You can slowly return to normal activities but must avoid strenuous activity and heavy lifting until your doctor tell you it's OK. Avoid activities such as vacuuming or swinging a golf club. You can drive after one week if you are comfortable and you are no longer taking prescription pain medications. It is normal to feel tired for serval weeks after your surgery. It is also normal to have difficulty with sleep habits, eating, and bowel movements after surgery. These will go away with time.  Bathing/Showering  You may shower after you come home. Do not soak in a bathtub, hot tub, or swim until the incision heals completely.  Incision Care  Shower every day. Clean your incision with mild soap and water. Pat the area dry with a clean towel. You do not need a bandage unless otherwise instructed. Do not apply any ointments or creams to your incision. You may have skin glue on your incision. Do not peel it off. It will come off on its own in about one week. Your incision may feel thickened and raised for several weeks after your surgery. This is normal and the skin will soften over time. For Men Only: It's OK to shave around the incision but do not shave the incision itself for 2 weeks. It is common to have numbness under your chin that could last for several months.  Diet  Resume your normal diet. There are no special food restrictions following this procedure. A low fat/low cholesterol diet is recommended for all patients with vascular disease. In order to heal from your surgery, it is CRITICAL to get adequate nutrition. Your body requires vitamins, minerals, and protein. Vegetables are the best  source of vitamins and minerals. Vegetables also provide the perfect balance of protein. Processed food has little nutritional value, so try to avoid this.        Medications  Resume taking all of your medications unless your doctor or physician assistant tells you not to. If your incision is causing pain, you may take over-the- counter pain relievers such as acetaminophen (Tylenol). If you were prescribed a stronger pain medication, please be aware these medications can cause nausea and constipation. Prevent nausea by taking the medication with a snack or meal. Avoid constipation by drinking plenty of fluids and eating foods with a high amount of fiber, such as fruits, vegetables, and grains. Do not take Tylenol if you are taking prescription pain medications.  Follow Up  Our office will schedule a follow up appointment 2-3 weeks following discharge.  Please call us immediately for any of the following conditions  Increased pain, redness, drainage (pus) from your incision site. Fever of 101 degrees or higher. If you should develop stroke (slurred speech, difficulty swallowing, weakness on one side of your body, loss of vision) you should call 911 and go to the nearest emergency room.  Reduce your risk of vascular disease:  Stop smoking. If you would like help call [facility] at 1-800-QUIT-NOW (1-800-784-8669) or [place] at [phone]. Manage your cholesterol Maintain a desired weight Control your diabetes Keep your blood pressure down  If you have any questions, please call the office at [phone].   

## 2020-07-16 NOTE — Progress Notes (Signed)
PHARMACY NOTE:  ANTIMICROBIAL RENAL DOSAGE ADJUSTMENT  Current antimicrobial regimen includes a mismatch between antimicrobial dosage and estimated renal function.  As per policy approved by the Pharmacy & Therapeutics and Medical Executive Committees, the antimicrobial dosage will be adjusted accordingly.  Current antimicrobial dosage:  Vancomycin 1000mg  IV q12h x 2  Indication: Surgical prophylaxis  Renal Function:  Estimated Creatinine Clearance: 41.7 mL/min (A) (by C-G formula based on SCr of 1.65 mg/dL (H)). []      On intermittent HD, scheduled: []      On CRRT    Antimicrobial dosage has been changed to: Vancomycin 1000mg  IV q24h x 1   Additional comments:   Kristine Gray, PharmD, BCPS, FNKF Clinical Pharmacist [place] Please utilize Amion for appropriate phone number to reach the unit pharmacist ([place])   07/16/2020 3:56 PM

## 2020-07-16 NOTE — Progress Notes (Signed)
PROGRESS NOTE    Kristine Gray  EPP:[id number] DOB: [birth year] DOA: 07/14/2020 PCP: Bernerd Limbo, MD   Brief Narrative:  Kristine Gray is a 69 y.o. female with medical history significant for morbid obesity, IDDM on U500, presents for acute onset aphasia that started around 4:30 PM on 07/14/20. Husband brought to ED and patient admitted for TIA.  Assessment & Plan:   Active Problems:   DM2 (diabetes mellitus, type 2) (HCC)   HTN (hypertension)   Hyperlipidemia   TIA (transient ischemic attack)  Acute aphasia secondary to TIA and symptomatic L ICA stenosis - Neurology following, appreciate insight recommendations - Repeat MRI shows no acute infarct hemorrhage or mass only moderate chronic microvascular ischemic changes - CTA shows 70% plaque - plan for vascular evaluation/endarterectomy later today - Echo completed, pending formal read - Continue aspirin Plavix statin per neurology  Insulin-dependent diabetes type 2, controlled  - Patient on U5 100 at home with varying doses of 120, 70 and 50 units throughout the day - HgbA1C - 9.0 - Discussed with pharmacy, will transition to 80 units 3 times daily given patient has total to 40 units at home daily - Monitor closely given high doses of insulin and concern for hypoglycemic protocol  Nausea and vomiting with leukocytosis, rule out gastroenteritis - AM provider to consider CT abdomen and patient has history of diverticulosis - Symptoms appear to have resolved  DVT prophylaxis: SCDs Code Status: FULL Family Communication: None present  Status is: Inpatient  Dispo: The patient is from: Home              Anticipated d/c is to: To be determined              Anticipated d/c date is: 48 to 72 hours              Patient currently not medically stable for discharge given ongoing need for further imaging evaluation and management with neurology and possible placement given PT evaluation  Consultants:    Neurology  Procedures:   None planned  Antimicrobials:  None indicated  Subjective: No acute issues or events overnight; awaiting surgical evaluation  Objective: Vitals:   07/15/20 2057 07/15/20 2335 07/16/20 0417 07/16/20 0731  BP: (!) 142/64 (!) 117/47 (!) 176/64 (!) 168/76  Pulse: 81 74 77 72  Resp: 18 19 19 16   Temp: 97.7 F (36.5 C) 97.9 F (36.6 C) (!) 97.4 F (36.3 C) (!) 97.5 F (36.4 C)  TempSrc: Oral Oral Oral Oral  SpO2: 98% 100% 99% 100%  Weight:      Height:        Intake/Output Summary (Last 24 hours) at 07/16/2020 0805 Last data filed at 07/16/2020 0238 Gross per 24 hour  Intake 576.84 ml  Output --  Net 576.84 ml   Filed Weights   07/14/20 1721 07/14/20 2340  Weight: 117.9 kg 125.8 kg    Examination: General:  Pleasantly resting in bed, No acute distress. HEENT:  Normocephalic atraumatic.  Sclerae nonicteric, noninjected.  Extraocular movements intact bilaterally. Neck:  Without mass or deformity.  Trachea is midline. Lungs:  Clear to auscultate bilaterally without rhonchi, wheeze, or rales. Heart:  Regular rate and rhythm.  Without murmurs, rubs, or gallops. Abdomen:  Soft, nontender, nondistended.  Without guarding or rebound. Extremities: Without cyanosis, clubbing, edema, or obvious deformity. Vascular:  Dorsalis pedis and posterior tibial pulses palpable bilaterally. Skin:  Warm and dry, no erythema, no ulcerations.  Data Reviewed: I have personally reviewed  following labs and imaging studies  CBC: Recent Labs  Lab 07/14/20 1635 07/16/20 0125  WBC 12.8* 11.8*  NEUTROABS 5.8  --   HGB 15.4* 13.1  HCT 44.5 37.9  MCV 89.4 91.3  PLT 255 382   Basic Metabolic Panel: Recent Labs  Lab 07/14/20 1635 07/16/20 0125  NA 128* 132*  K 3.5 3.4*  CL 89* 95*  CO2 25 26  GLUCOSE 167* 70  BUN 19 20  CREATININE 1.61* 1.65*  CALCIUM 9.5 9.3   GFR: Estimated Creatinine Clearance: 41.6 mL/min (A) (by C-G formula based on SCr of 1.65  mg/dL (H)). Liver Function Tests: Recent Labs  Lab 07/14/20 1635 07/16/20 0125  AST 42* 43*  ALT 44 40  ALKPHOS 69 61  BILITOT 1.2 1.1  PROT 7.5 6.3*  ALBUMIN 4.0 3.5   No results for input(s): LIPASE, AMYLASE in the last 168 hours. No results for input(s): AMMONIA in the last 168 hours. Coagulation Profile: Recent Labs  Lab 07/14/20 1635  INR 1.0   Cardiac Enzymes: Recent Labs  Lab 07/15/20 0242  CKTOTAL 178   BNP (last 3 results) No results for input(s): PROBNP in the last 8760 hours. HbA1C: Recent Labs    07/15/20 0242  HGBA1C 9.0*   CBG: Recent Labs  Lab 07/15/20 1652 07/15/20 2117 07/16/20 0409 07/16/20 0435 07/16/20 0624  GLUCAP 269* 252* 46* 106* 117*   Lipid Profile: Recent Labs    07/15/20 0242  CHOL 203*  HDL 52  LDLCALC 123*  TRIG 139  CHOLHDL 3.9   Thyroid Function Tests: No results for input(s): TSH, T4TOTAL, FREET4, T3FREE, THYROIDAB in the last 72 hours. Anemia Panel: No results for input(s): VITAMINB12, FOLATE, FERRITIN, TIBC, IRON, RETICCTPCT in the last 72 hours. Sepsis Labs: No results for input(s): PROCALCITON, LATICACIDVEN in the last 168 hours.  Recent Results (from the past 240 hour(s))  Respiratory Panel by RT PCR (Flu A&B, Covid) - Nasopharyngeal Swab     Status: None   Collection Time: 07/14/20  6:23 PM   Specimen: Nasopharyngeal Swab  Result Value Ref Range Status   SARS Coronavirus 2 by RT PCR NEGATIVE NEGATIVE Final    Comment: (NOTE) SARS-CoV-2 target nucleic acids are NOT DETECTED.  The SARS-CoV-2 RNA is generally detectable in upper respiratoy specimens during the acute phase of infection. The lowest concentration of SARS-CoV-2 viral copies this assay can detect is 131 copies/mL. A negative result does not preclude SARS-Cov-2 infection and should not be used as the sole basis for treatment or other patient management decisions. A negative result may occur with  improper specimen collection/handling,  submission of specimen other than nasopharyngeal swab, presence of viral mutation(s) within the areas targeted by this assay, and inadequate number of viral copies (<131 copies/mL). A negative result must be combined with clinical observations, patient history, and epidemiological information. The expected result is Negative.  Fact Sheet for Patients:  PinkCheek.be  Fact Sheet for Healthcare Providers:  GravelBags.it  This test is no t yet approved or cleared by the [place] FDA and  has been authorized for detection and/or diagnosis of SARS-CoV-2 by FDA under an Emergency Use Authorization (EUA). This EUA will remain  in effect (meaning this test can be used) for the duration of the COVID-19 declaration under Section 564(b)(1) of the Act, 21 U.S.C. section 360bbb-3(b)(1), unless the authorization is terminated or revoked sooner.     Influenza A by PCR NEGATIVE NEGATIVE Final   Influenza B by PCR NEGATIVE NEGATIVE Final  Comment: (NOTE) The Xpert Xpress SARS-CoV-2/FLU/RSV assay is intended as an aid in  the diagnosis of influenza from Nasopharyngeal swab specimens and  should not be used as a sole basis for treatment. Nasal washings and  aspirates are unacceptable for Xpert Xpress SARS-CoV-2/FLU/RSV  testing.  Fact Sheet for Patients: PinkCheek.be  Fact Sheet for Healthcare Providers: GravelBags.it  This test is not yet approved or cleared by the [place] FDA and  has been authorized for detection and/or diagnosis of SARS-CoV-2 by  FDA under an Emergency Use Authorization (EUA). This EUA will remain  in effect (meaning this test can be used) for the duration of the  Covid-19 declaration under Section 564(b)(1) of the Act, 21  U.S.C. section 360bbb-3(b)(1), unless the authorization is  terminated or revoked. Performed at [facility], [address] [place], [place] [address]          Radiology Studies: CT Angio Head W or Wo Contrast  Result Date: 07/14/2020 CLINICAL DATA:  Initial evaluation for acute slurred speech. EXAM: CT ANGIOGRAPHY HEAD AND NECK TECHNIQUE: Multidetector CT imaging of the head and neck was performed using the standard protocol during bolus administration of intravenous contrast. Multiplanar CT image reconstructions and MIPs were obtained to evaluate the vascular anatomy. Carotid stenosis measurements (when applicable) are obtained utilizing NASCET criteria, using the distal internal carotid diameter as the denominator. CONTRAST:  118mL OMNIPAQUE IOHEXOL 350 MG/ML SOLN COMPARISON:  Prior CT from earlier same day. FINDINGS: CTA NECK FINDINGS Aortic arch: Visualized aortic arch normal caliber with normal 3 vessel morphology. Mild atheromatous change about the arch itself. No hemodynamically significant stenosis seen about the origin of the great vessels. Right carotid system: Right CCA patent from its origin to the bifurcation without stenosis. Bulky calcified plaque about the right bifurcation/proximal right ICA with associated stenosis of up to approximately 70% by NASCET criteria. Right ICA patent distally to the skull base without stenosis, dissection or occlusion. Left carotid system: Left CCA patent from its origin to the bifurcation without stenosis. Bulky calcified plaque about the left bifurcation/proximal left ICA with associated stenosis of up to approximately 70% by NASCET criteria. Left ICA patent distally to the skull base without stenosis, dissection or occlusion. Vertebral arteries: Both vertebral arteries arise from the subclavian arteries. No proximal subclavian artery stenosis. Both vertebral arteries patent within the neck without stenosis, dissection or occlusion. Skeleton: No visible acute osseous abnormality. No discrete or worrisome osseous lesions. Advanced multilevel right-sided facet  arthrosis noted throughout the cervical spine. Moderate cervical spondylosis present at C5-6 and C6-7. Other neck: No other acute soft tissue abnormality within the neck. No mass lesion or adenopathy. Upper chest: Scattered atelectatic changes noted within the visualized lungs. Visualized upper chest demonstrates no other acute finding. Review of the MIP images confirms the above findings CTA HEAD FINDINGS Anterior circulation: Petrous segments patent bilaterally. Diffuse calcified atheromatous plaque seen throughout the carotid siphons with no more than mild diffuse stenosis. A1 segments patent bilaterally. Normal anterior communicating artery complex. Anterior cerebral arteries patent to their distal aspects without stenosis. No M1 stenosis or occlusion. Normal MCA bifurcations. Distal MCA branches well perfused and symmetric. Posterior circulation: Both vertebral arteries patent to the vertebrobasilar junction without stenosis. Both picas patent. Basilar patent to its distal aspect without stenosis. Superior cerebral arteries patent bilaterally. Right PCA supplied via the basilar. Fetal type origin of the left PCA. Short-segment moderate proximal right P2 stenosis noted (series 8, image 118). Both PCAs otherwise widely  patent and well perfused to their distal aspects. Venous sinuses: Grossly patent allowing for timing the contrast bolus. Anatomic variants: Fetal type origin of the left PCA. No intracranial aneurysm. Review of the MIP images confirms the above findings IMPRESSION: 1. Negative CTA for emergent large vessel occlusion. 2. Bulky calcified atheromatous plaque about the carotid bifurcations/proximal ICAs with associated stenoses of up to 70% bilaterally. 3. Moderate short-segment proximal right P2 stenosis. 4. Fetal type origin of the left PCA. Critical Value/emergent results were called by telephone at the time of interpretation poor on 07/14/2020 at 6:02 pm to provider [facility] , who verbally  acknowledged these results. Electronically Signed   By: Jeannine Boga M.D.   On: 07/14/2020 18:07   CT Angio Neck W and/or Wo Contrast  Result Date: 07/14/2020 CLINICAL DATA:  Initial evaluation for acute slurred speech. EXAM: CT ANGIOGRAPHY HEAD AND NECK TECHNIQUE: Multidetector CT imaging of the head and neck was performed using the standard protocol during bolus administration of intravenous contrast. Multiplanar CT image reconstructions and MIPs were obtained to evaluate the vascular anatomy. Carotid stenosis measurements (when applicable) are obtained utilizing NASCET criteria, using the distal internal carotid diameter as the denominator. CONTRAST:  128mL OMNIPAQUE IOHEXOL 350 MG/ML SOLN COMPARISON:  Prior CT from earlier same day. FINDINGS: CTA NECK FINDINGS Aortic arch: Visualized aortic arch normal caliber with normal 3 vessel morphology. Mild atheromatous change about the arch itself. No hemodynamically significant stenosis seen about the origin of the great vessels. Right carotid system: Right CCA patent from its origin to the bifurcation without stenosis. Bulky calcified plaque about the right bifurcation/proximal right ICA with associated stenosis of up to approximately 70% by NASCET criteria. Right ICA patent distally to the skull base without stenosis, dissection or occlusion. Left carotid system: Left CCA patent from its origin to the bifurcation without stenosis. Bulky calcified plaque about the left bifurcation/proximal left ICA with associated stenosis of up to approximately 70% by NASCET criteria. Left ICA patent distally to the skull base without stenosis, dissection or occlusion. Vertebral arteries: Both vertebral arteries arise from the subclavian arteries. No proximal subclavian artery stenosis. Both vertebral arteries patent within the neck without stenosis, dissection or occlusion. Skeleton: No visible acute osseous abnormality. No discrete or worrisome osseous lesions. Advanced  multilevel right-sided facet arthrosis noted throughout the cervical spine. Moderate cervical spondylosis present at C5-6 and C6-7. Other neck: No other acute soft tissue abnormality within the neck. No mass lesion or adenopathy. Upper chest: Scattered atelectatic changes noted within the visualized lungs. Visualized upper chest demonstrates no other acute finding. Review of the MIP images confirms the above findings CTA HEAD FINDINGS Anterior circulation: Petrous segments patent bilaterally. Diffuse calcified atheromatous plaque seen throughout the carotid siphons with no more than mild diffuse stenosis. A1 segments patent bilaterally. Normal anterior communicating artery complex. Anterior cerebral arteries patent to their distal aspects without stenosis. No M1 stenosis or occlusion. Normal MCA bifurcations. Distal MCA branches well perfused and symmetric. Posterior circulation: Both vertebral arteries patent to the vertebrobasilar junction without stenosis. Both picas patent. Basilar patent to its distal aspect without stenosis. Superior cerebral arteries patent bilaterally. Right PCA supplied via the basilar. Fetal type origin of the left PCA. Short-segment moderate proximal right P2 stenosis noted (series 8, image 118). Both PCAs otherwise widely patent and well perfused to their distal aspects. Venous sinuses: Grossly patent allowing for timing the contrast bolus. Anatomic variants: Fetal type origin of the left PCA. No intracranial aneurysm. Review of the MIP  images confirms the above findings IMPRESSION: 1. Negative CTA for emergent large vessel occlusion. 2. Bulky calcified atheromatous plaque about the carotid bifurcations/proximal ICAs with associated stenoses of up to 70% bilaterally. 3. Moderate short-segment proximal right P2 stenosis. 4. Fetal type origin of the left PCA. Critical Value/emergent results were called by telephone at the time of interpretation poor on 07/14/2020 at 6:02 pm to provider  [facility] , who verbally acknowledged these results. Electronically Signed   By: Jeannine Boga M.D.   On: 07/14/2020 18:07   MR BRAIN WO CONTRAST  Result Date: 07/15/2020 CLINICAL DATA:  Abnormal speech, code stroke follow-up EXAM: MRI HEAD WITHOUT CONTRAST TECHNIQUE: Multiplanar, multiecho pulse sequences of the brain and surrounding structures were obtained without intravenous contrast. COMPARISON:  None. FINDINGS: Brain: There is no acute infarction or intracranial hemorrhage. There is no intracranial mass, mass effect, or edema. There is no hydrocephalus or extra-axial fluid collection. Prominence of ventricles and sulci reflects minor generalized parenchymal volume loss. Patchy and confluent areas of T2 hyperintensity in the supratentorial white matter are nonspecific but may reflect moderate chronic microvascular ischemic changes. Vascular: Major vessel flow voids at the skull base are preserved. Skull and upper cervical spine: Normal marrow signal is preserved. Sinuses/Orbits: Paranasal sinuses are aerated. Orbits are unremarkable. Other: Sella is unremarkable.  Mastoid air cells are clear. IMPRESSION: No acute infarction, hemorrhage, or mass. Moderate chronic microvascular ischemic changes. Electronically Signed   By: Macy Mis M.D.   On: 07/15/2020 10:10   ECHOCARDIOGRAM COMPLETE  Result Date: 07/15/2020    ECHOCARDIOGRAM REPORT   Patient Name:   Kristine Gray Date of Exam: 07/15/2020 Medical Rec #:  [id number]          Height:       64.0 in Accession #:    [id number]         Weight:       277.3 lb Date of Birth:  18-Sep-1951          BSA:          2.248 m Patient Age:    68 years           BP:           111/51 mmHg Patient Gender: F                  HR:           76 bpm. Exam Location:  Inpatient Procedure: 2D Echo, Cardiac Doppler and Color Doppler Indications:    Stroke 434.91 / I163.9  History:        Patient has no prior history of Echocardiogram examinations.                  Risk Factors:Hypertension and Diabetes.  Sonographer:    Bernadene Person RDCS Referring Phys: [id number] AMY N [facility] IMPRESSIONS  1. Left ventricular ejection fraction, by estimation, is 70 to 75%. The left ventricle has hyperdynamic function. The left ventricle has no regional wall motion abnormalities. There is moderate concentric left ventricular hypertrophy. Left ventricular diastolic parameters are consistent with Grade I diastolic dysfunction (impaired relaxation). Elevated left atrial pressure.  2. Right ventricular systolic function is normal. The right ventricular size is normal.  3. The pericardial effusion is posterior to the left ventricle. There is no evidence of cardiac tamponade.  4. The mitral valve is normal in structure. Trivial mitral valve regurgitation. No evidence of mitral stenosis.  5. The aortic valve is normal in  structure. Aortic valve regurgitation is not visualized. No aortic stenosis is present.  6. The inferior vena cava is normal in size with greater than 50% respiratory variability, suggesting right atrial pressure of 3 mmHg. Comparison(s): No prior Echocardiogram. Conclusion(s)/Recommendation(s): No intracardiac source of embolism detected on this transthoracic study. A transesophageal echocardiogram is recommended to exclude cardiac source of embolism if clinically indicated. FINDINGS  Left Ventricle: Left ventricular ejection fraction, by estimation, is 70 to 75%. The left ventricle has hyperdynamic function. The left ventricle has no regional wall motion abnormalities. The left ventricular internal cavity size was normal in size. There is moderate concentric left ventricular hypertrophy. Left ventricular diastolic parameters are consistent with Grade I diastolic dysfunction (impaired relaxation). Elevated left atrial pressure. Right Ventricle: The right ventricular size is normal. No increase in right ventricular wall thickness. Right ventricular systolic function is normal. Left  Atrium: Left atrial size was normal in size. Right Atrium: Right atrial size was normal in size. Pericardium: Trivial pericardial effusion is present. The pericardial effusion is posterior to the left ventricle. There is no evidence of cardiac tamponade. Mitral Valve: The mitral valve is normal in structure. Trivial mitral valve regurgitation. No evidence of mitral valve stenosis. Tricuspid Valve: The tricuspid valve is normal in structure. Tricuspid valve regurgitation is trivial. No evidence of tricuspid stenosis. Aortic Valve: The aortic valve is normal in structure. Aortic valve regurgitation is not visualized. No aortic stenosis is present. Pulmonic Valve: The pulmonic valve was normal in structure. Pulmonic valve regurgitation is not visualized. No evidence of pulmonic stenosis. Aorta: The aortic root is normal in size and structure. Venous: The inferior vena cava is normal in size with greater than 50% respiratory variability, suggesting right atrial pressure of 3 mmHg. IAS/Shunts: No atrial level shunt detected by color flow Doppler.  LEFT VENTRICLE PLAX 2D LVIDd:         4.30 cm  Diastology LVIDs:         2.40 cm  LV e' medial:    5.87 cm/s LV PW:         1.30 cm  LV E/e' medial:  11.9 LV IVS:        1.20 cm  LV e' lateral:   6.97 cm/s LVOT diam:     2.10 cm  LV E/e' lateral: 10.0 LV SV:         79 LV SV Index:   35 LVOT Area:     3.46 cm  RIGHT VENTRICLE RV S prime:     12.50 cm/s TAPSE (M-mode): 2.1 cm LEFT ATRIUM             Index       RIGHT ATRIUM           Index LA diam:        3.50 cm 1.56 cm/m  RA Area:     10.80 cm LA Vol (A2C):   53.2 ml 23.67 ml/m RA Volume:   19.80 ml  8.81 ml/m LA Vol (A4C):   52.0 ml 23.13 ml/m LA Biplane Vol: 52.5 ml 23.35 ml/m  AORTIC VALVE LVOT Vmax:   96.60 cm/s LVOT Vmean:  66.100 cm/s LVOT VTI:    0.229 m  AORTA Ao Root diam: 3.30 cm Ao Asc diam:  2.90 cm MITRAL VALVE MV Area (PHT): 2.62 cm    SHUNTS MV Decel Time: 289 msec    Systemic VTI:  0.23 m MV E velocity:  69.80 cm/s  Systemic Diam: 2.10 cm MV A velocity:  81.80 cm/s MV E/A ratio:  0.85 Ena Dawley MD Electronically signed by Ena Dawley MD Signature Date/Time: 07/15/2020/8:26:49 PM    Final    CT HEAD CODE STROKE WO CONTRAST`  Result Date: 07/14/2020 CLINICAL DATA:  Code stroke. Initial evaluation for acute right arm numbness, speech difficulty. EXAM: CT HEAD WITHOUT CONTRAST TECHNIQUE: Contiguous axial images were obtained from the base of the skull through the vertex without intravenous contrast. COMPARISON:  Prior CT from 03/16/2010. FINDINGS: Brain: Generalized age-related cerebral atrophy. Patchy and confluent hypodensity within the periventricular deep white matter both cerebral hemispheres most consistent with chronic small vessel ischemic disease, moderate in nature. No acute intracranial hemorrhage. No acute large vessel territory infarct. No mass lesion or midline shift. No hydrocephalus or extra-axial fluid collection. Vascular: No hyperdense vessel. Calcified atherosclerosis at the skull base. Skull: Scalp soft tissues and calvarium within normal limits. Sinuses/Orbits: Globes and orbital soft tissues within normal limits. Paranasal sinuses are clear. Trace right mastoid effusion noted. Other: None. ASPECTS [facility] Stroke Program Early CT Score) - Ganglionic level infarction (caudate, lentiform nuclei, internal capsule, insula, M1-M3 cortex): 7 - Supraganglionic infarction (M4-M6 cortex): 3 Total score (0-10 with 10 being normal): 10 IMPRESSION: 1. No acute intracranial infarct or other abnormality. 2. ASPECTS is 10. 3. Moderately advanced chronic microvascular ischemic disease. Critical Value/emergent results were called by telephone at the time of interpretation on 07/14/2020 at 5:41 pm to provider [facility] , who verbally acknowledged these results. Electronically Signed   By: Jeannine Boga M.D.   On: 07/14/2020 17:42    Scheduled Meds: . allopurinol  100 mg Oral Daily  .  aspirin EC  325 mg Oral Q breakfast  . atorvastatin  80 mg Oral Daily  . clopidogrel  75 mg Oral Daily  . dextrose      . folic acid  1 mg Oral Daily  . insulin regular human CONCENTRATED  80 Units Subcutaneous TID WC  . polycarbophil  625 mg Oral Daily  . vitamin B-12  1,000 mcg Oral Daily  . vitamin E  400 Units Oral Daily   Continuous Infusions: . sodium chloride 100 mL/hr at 07/16/20 0238     LOS: 1 day   Time spent: 25min   Aamilah Augenstein C Miarose Lippert, DO [facility]  If 7PM-7AM, please contact night-coverage [email or web address]  07/16/2020, 8:05 AM

## 2020-07-16 NOTE — Progress Notes (Signed)
SLP Cancellation Note  Patient Details Name: KEYATTA TOLLES MRN: [id number] DOB: [birth year]   Cancelled treatment:       Reason Eval/Treat Not Completed: Patient at procedure or test/unavailable; pt in surgery when SLP attempted; will continue efforts.   Elvina Sidle, M.S., CCC-SLP 07/16/2020, 10:53 AM

## 2020-07-16 NOTE — Anesthesia Procedure Notes (Signed)
Procedure Name: Intubation Date/Time: 07/16/2020 11:09 AM Performed by: Candis Shine, CRNA Pre-anesthesia Checklist: Patient identified, Emergency Drugs available, Suction available and Patient being monitored Patient Re-evaluated:Patient Re-evaluated prior to induction Oxygen Delivery Method: Circle System Utilized Preoxygenation: Pre-oxygenation with 100% oxygen Induction Type: IV induction Ventilation: Oral airway inserted - appropriate to patient size and Mask ventilation without difficulty Laryngoscope Size: Mac and 3 Grade View: Grade I Tube type: Oral Tube size: 7.0 mm Number of attempts: 1 Airway Equipment and Method: Stylet and Oral airway Placement Confirmation: ETT inserted through vocal cords under direct vision,  positive ETCO2 and breath sounds checked- equal and bilateral Secured at: 22 cm Tube secured with: Tape Dental Injury: Teeth and Oropharynx as per pre-operative assessment

## 2020-07-16 NOTE — Progress Notes (Signed)
Occupational Therapy Evaluation Patient Details Name: Kristine Gray MRN: [id number] DOB: 02-Jan-1951 Today's Date: 07/16/2020    History of Present Illness 69 year old female with PMH significant for HTN, DM. Pt with acute onset of expressive aphasia and right hand numbness. CT, CTA, and MRI all negative.   Clinical Impression   "I'm about 90%". Pt close to baseline level of functioning. Educated pt on strategies to reduce risk of falls. Also educated pt on signs/symptoms of CVA using BeFast. Pt verbalized understanding. No further OT needs. OT signing off.     Follow Up Recommendations  No OT follow up    Equipment Recommendations  None recommended by OT    Recommendations for Other Services       Precautions / Restrictions Precautions Precautions: None      Mobility Bed Mobility Overal bed mobility: Independent                  Transfers Overall transfer level: Independent                    Balance Overall balance assessment: Mild deficits observed, not formally tested                                         ADL either performed or assessed with clinical judgement   ADL                                         General ADL Comments: Clost to baseline; educated on recommendation for pt to use her showerseat until she feels back to baeline. Pt also scheduled to have surgery. Recommended use of seat to reduce risk of falls. Handout on falls reviewed     Vision   Additional Comments: PT complains of blurry vision at times duet o "diabetes". Followed regularly by eye docotr     Perception     Praxis      Pertinent Vitals/Pain Pain Assessment: No/denies pain     Hand Dominance Right   Extremity/Trunk Assessment Upper Extremity Assessment Upper Extremity Assessment: Overall WFL for tasks assessed (R numbness has resolved)   Lower Extremity Assessment Lower Extremity Assessment: Defer to PT evaluation    Cervical / Trunk Assessment Cervical / Trunk Assessment: Normal   Communication Communication Communication: Expressive difficulties ("I have trouble getting my words out at times")   Cognition Arousal/Alertness: Awake/alert Behavior During Therapy: WFL for tasks assessed/performed Overall Cognitive Status: Within Functional Limits for tasks assessed                                     General Comments       Exercises     Shoulder Instructions      Home Living Family/patient expects to be discharged to:: Private residence Living Arrangements: Spouse/significant other Available Help at Discharge: Family;Available 24 hours/day Type of Home: House Home Access: Stairs to enter [employer] of Steps: 4   Home Layout: Multi-level Alternate Level Stairs-Number of Steps: flight   Bathroom Shower/Tub: Tub/shower unit;Walk-in shower   Bathroom Toilet: Standard Bathroom Accessibility: Yes How Accessible: Accessible via walker Home Equipment: Shower seat          Prior Functioning/Environment Level of  Independence: Independent        Comments: [employer]; manages her medication        OT Problem List:        OT Treatment/Interventions:      OT Goals(Current goals can be found in the care plan section) Acute Rehab OT Goals Patient Stated Goal: to have surgery and go home OT Goal Formulation: All assessment and education complete, DC therapy  OT Frequency:     Barriers to D/C:            Co-evaluation              AM-PAC OT "6 Clicks" Daily Activity     Outcome Measure Help from another person eating meals?: None Help from another person taking care of personal grooming?: None Help from another person toileting, which includes using toliet, bedpan, or urinal?: None Help from another person bathing (including washing, rinsing, drying)?: None Help from another person to put on and taking off regular upper body clothing?:  None Help from another person to put on and taking off regular lower body clothing?: None 6 Click Score: 24   End of Session Nurse Communication: Other (comment) (Pt asking about her IV)  Activity Tolerance: Patient tolerated treatment well Patient left: in bed;with call bell/phone within reach                   Time: 0850-0907 OT Time Calculation (min): 17 min Charges:  OT General Charges $OT Visit: 1 Visit OT Evaluation $OT Eval Low Complexity: [place], OT/L   Acute OT Clinical Specialist Acute Rehabilitation Services Pager [phone] Office [phone]   [facility] 07/16/2020, 9:16 AM

## 2020-07-16 NOTE — Interval H&P Note (Signed)
History and Physical Interval Note:  07/16/2020 9:50 AM  Kristine Gray  has presented today for surgery, with the diagnosis of TIA.  The various methods of treatment have been discussed with the patient and family. After consideration of risks, benefits and other options for treatment, the patient has consented to  Procedure(s): ENDARTERECTOMY CAROTID (Left) as a surgical intervention.  The patient's history has been reviewed, patient examined, no change in status, stable for surgery.  I have reviewed the patient's chart and labs.  Questions were answered to the patient's satisfaction.     Curt Jews

## 2020-07-16 NOTE — Progress Notes (Signed)
CRITICAL VALUE STICKER  CRITICAL VALUE: Results for Kristine Gray, Kristine Gray (MRN [id number]) as of 07/16/2020 04:13  Ref. Range 07/16/2020 04:09  Glucose-Capillary Latest Ref Range: 70 - 99 mg/dL 46 (L)    RESPONSE: Hypoglycemic orders placed.

## 2020-07-16 NOTE — Progress Notes (Signed)
    S/P left CEA No tongue deviation tolerating liquids, speech is clear Moving all ext. Left Neck incision soft without hematoma  Roxy Horseman PA-C

## 2020-07-16 NOTE — Progress Notes (Addendum)
Report called to [phone];[facility] transported to the [place]; belongings left in room 3w34.

## 2020-07-17 ENCOUNTER — Other Ambulatory Visit (HOSPITAL_COMMUNITY): Payer: Self-pay | Admitting: Internal Medicine

## 2020-07-17 DIAGNOSIS — Z794 Long term (current) use of insulin: Secondary | ICD-10-CM | POA: Diagnosis not present

## 2020-07-17 DIAGNOSIS — E1149 Type 2 diabetes mellitus with other diabetic neurological complication: Secondary | ICD-10-CM | POA: Diagnosis not present

## 2020-07-17 DIAGNOSIS — I10 Essential (primary) hypertension: Secondary | ICD-10-CM | POA: Diagnosis not present

## 2020-07-17 DIAGNOSIS — E7841 Elevated Lipoprotein(a): Secondary | ICD-10-CM | POA: Diagnosis not present

## 2020-07-17 LAB — COMPREHENSIVE METABOLIC PANEL
ALT: 41 U/L (ref 0–44)
AST: 39 U/L (ref 15–41)
Albumin: 3.3 g/dL — ABNORMAL LOW (ref 3.5–5.0)
Alkaline Phosphatase: 56 U/L (ref 38–126)
Anion gap: 11 (ref 5–15)
BUN: 17 mg/dL (ref 8–23)
CO2: 24 mmol/L (ref 22–32)
Calcium: 9 mg/dL (ref 8.9–10.3)
Chloride: 98 mmol/L (ref 98–111)
Creatinine, Ser: 1.5 mg/dL — ABNORMAL HIGH (ref 0.44–1.00)
GFR, Estimated: 37 mL/min — ABNORMAL LOW (ref >60)
Glucose, Bld: 304 mg/dL — ABNORMAL HIGH (ref 70–99)
Potassium: 4.2 mmol/L (ref 3.5–5.1)
Sodium: 133 mmol/L — ABNORMAL LOW (ref 135–145)
Total Bilirubin: 1.3 mg/dL — ABNORMAL HIGH (ref 0.3–1.2)
Total Protein: 6.1 g/dL — ABNORMAL LOW (ref 6.5–8.1)

## 2020-07-17 LAB — HEMOGLOBIN A1C
Hgb A1c MFr Bld: 9 % — ABNORMAL HIGH (ref 4.8–5.6)
Mean Plasma Glucose: 212 mg/dL

## 2020-07-17 LAB — CBC
HCT: 38.1 % (ref 36.0–46.0)
Hemoglobin: 13 g/dL (ref 12.0–15.0)
MCH: 31.3 pg (ref 26.0–34.0)
MCHC: 34.1 g/dL (ref 30.0–36.0)
MCV: 91.8 fL (ref 80.0–100.0)
Platelets: 208 10*3/uL (ref 150–400)
RBC: 4.15 MIL/uL (ref 3.87–5.11)
RDW: 13.7 % (ref 11.5–15.5)
WBC: 15.1 10*3/uL — ABNORMAL HIGH (ref 4.0–10.5)
nRBC: 0 % (ref 0.0–0.2)

## 2020-07-17 LAB — GLUCOSE, CAPILLARY
Glucose-Capillary: 287 mg/dL — ABNORMAL HIGH (ref 70–99)
Glucose-Capillary: 332 mg/dL — ABNORMAL HIGH (ref 70–99)

## 2020-07-17 MED ORDER — TRAMADOL HCL 50 MG PO TABS: 50.0000 mg | ORAL_TABLET | Freq: Four times a day (QID) | ORAL | 0 refills | Status: AC | PRN

## 2020-07-17 MED ORDER — COLCHICINE 0.6 MG PO TABS
0.6000 mg | ORAL_TABLET | Freq: Every day | ORAL | 0 refills | Status: DC | PRN
Start: 2020-07-17 — End: 2020-07-17

## 2020-07-17 MED ORDER — CALCIUM POLYCARBOPHIL 625 MG PO TABS
625.0000 mg | ORAL_TABLET | Freq: Every day | ORAL | 0 refills | Status: DC
Start: 2020-07-17 — End: 2020-07-17

## 2020-07-17 MED ORDER — ASPIRIN 325 MG PO TBEC
325.0000 mg | DELAYED_RELEASE_TABLET | Freq: Every day | ORAL | 0 refills | Status: DC
Start: 2020-07-18 — End: 2020-07-17

## 2020-07-17 MED ORDER — CLOPIDOGREL BISULFATE 75 MG PO TABS
75.0000 mg | ORAL_TABLET | Freq: Every day | ORAL | 1 refills | Status: DC
Start: 2020-07-17 — End: 2020-08-17

## 2020-07-17 MED ORDER — FOLIC ACID 1 MG PO TABS
1.0000 mg | ORAL_TABLET | Freq: Every day | ORAL | 0 refills | Status: DC
Start: 2020-07-17 — End: 2020-08-06

## 2020-07-17 MED ORDER — ATORVASTATIN CALCIUM 80 MG PO TABS
80.0000 mg | ORAL_TABLET | Freq: Every day | ORAL | 0 refills | Status: DC
Start: 2020-07-17 — End: 2020-08-17

## 2020-07-17 MED ORDER — ASPIRIN 325 MG PO TBEC
325.0000 mg | DELAYED_RELEASE_TABLET | Freq: Every day | ORAL | 0 refills | Status: DC
Start: 2020-07-18 — End: 2020-12-10

## 2020-07-17 MED ORDER — CLOPIDOGREL BISULFATE 75 MG PO TABS
75.0000 mg | ORAL_TABLET | Freq: Every day | ORAL | 2 refills | Status: DC
Start: 2020-07-17 — End: 2020-07-17

## 2020-07-17 NOTE — Discharge Summary (Signed)
Physician Discharge Summary  Kristine Gray CXK:[id number] DOB: [birth year] DOA: 07/14/2020  PCP: Bernerd Limbo, MD  Admit date: 07/14/2020 Discharge date: 07/17/2020  Admitted From: Home Disposition: Home  Recommendations for Outpatient Follow-up:  1. Follow up with PCP in 1-2 weeks 2. Vascular surgery as scheduled  Home Health: None Equipment/Devices: None  Discharge Condition: Stable CODE STATUS: Full Diet recommendation: Diabetic low-carb low-fat diet  Brief/Interim Summary: Kristine A Bradshawis a 69 y.o.femalewith medical history significant formorbid obesity, IDDM on U500, presents for acute onset aphasia that started around 4:30 PM on 07/14/20. Husband brought to ED and patient admitted for TIA - found to have occlusive symptomatic L ICA stenosis - status post endarterectomy. Otherwise stable for discharge per neuro and vascular surgery.  Patient tolerated procedure quite well, symptoms have resolved at this point, will discharge on dual antiplatelet therapy with aspirin and Plavix per neurology transitioning to Plavix alone thereafter.  Close follow-up with PCP, vascular surgery and neurology as scheduled.  Patient otherwise agreeable and stable for discharge home.  Discharge Diagnoses:  Active Problems:   DM2 (diabetes mellitus, type 2) ([place])   HTN (hypertension)   Hyperlipidemia   TIA (transient ischemic attack)   Discharge Instructions  Discharge Instructions    Ambulatory referral to Neurology   Complete by: As directed    Follow up with stroke clinic NP (Jessica Vanschaick or Cecille Rubin, if both not available, consider Zachery Dauer, or Ahern) at [facility] in about 4 weeks. Thanks.   Call MD for:  redness, tenderness, or signs of infection (pain, swelling, redness, odor or green/yellow discharge around incision site)   Complete by: As directed    Call MD for:  severe uncontrolled pain   Complete by: As directed    Diet - low sodium heart healthy   Complete  by: As directed    Increase activity slowly   Complete by: As directed    No dressing needed   Complete by: As directed      Allergies as of 07/17/2020      Reactions   Penicillins Hives, Itching, Swelling   Valsartan Other (See Comments)   Bradycardia      Medication List    TAKE these medications   acetaminophen 650 MG CR tablet Commonly known as: TYLENOL Take 650 mg by mouth every 8 (eight) hours as needed for pain.   allopurinol 100 MG tablet Commonly known as: ZYLOPRIM Take 100 mg by mouth daily.   aspirin 325 MG EC tablet Take 1 tablet (325 mg total) by mouth daily with breakfast. Start taking on: July 18, 2020 What changed:   medication strength  how much to take   atorvastatin 80 MG tablet Commonly known as: LIPITOR Take 1 tablet (80 mg total) by mouth daily. What changed:   medication strength  how much to take   clopidogrel 75 MG tablet Commonly known as: PLAVIX Take 1 tablet (75 mg total) by mouth daily.   colchicine 0.6 MG tablet Take 1 tablet (0.6 mg total) by mouth daily as needed (gout flare). What changed:   reasons to take this  additional instructions   folic acid 1 MG tablet Commonly known as: FOLVITE Take 1 tablet (1 mg total) by mouth daily. What changed:   medication strength  how much to take   gabapentin 300 MG capsule Commonly known as: NEURONTIN Take 300 mg by mouth daily.   HUMULIN R 500 UNIT/ML injection Generic drug: insulin regular human CONCENTRATED Inject 40-120 Units into the  skin See admin instructions. 120 units when she wakes up ~ 12 noon , 70 units at 6 PM and 40-50 units at bedtime (0200-0400 AM)   hydrochlorothiazide 25 MG tablet Commonly known as: HYDRODIURIL Take 25 mg by mouth daily.   nebivolol 10 MG tablet Commonly known as: BYSTOLIC Take 10 mg by mouth 2 (two) times daily.   polycarbophil 625 MG tablet Commonly known as: FIBERCON Take 1 tablet (625 mg total) by mouth daily.   polyvinyl  alcohol 1.4 % ophthalmic solution Commonly known as: LIQUIFILM TEARS Place 1 drop into both eyes as needed for dry eyes.   traMADol 50 MG tablet Commonly known as: ULTRAM Take 1 tablet (50 mg total) by mouth every 6 (six) hours as needed for up to 3 days for moderate pain. What changed:   how much to take  reasons to take this   vitamin B-12 1000 MCG tablet Commonly known as: CYANOCOBALAMIN Take 1,000 mcg by mouth daily.   VITAMIN D PO Take 1 capsule by mouth daily.   vitamin E 180 MG (400 UNITS) capsule Take 400 Units by mouth daily.            Discharge Care Instructions  (From admission, onward)         Start     Ordered   07/17/20 0000  No dressing needed        07/17/20 1433          Follow-up Information    Waynetta Sandy, MD.   Specialties: Vascular Surgery, Cardiology Why: Office will call you to arrange your appt (sent) Contact information: [place] [address] [phone]        [facility]. Schedule an appointment as soon as possible for a visit in 4 week(s).   Specialty: Neurology Contact information: [place] [phone]             Allergies  Allergen Reactions  . Penicillins Hives, Itching and Swelling  . Valsartan Other (See Comments)    Bradycardia    Consultations:  Neurology, vascular surgery   Procedures/Studies: CT Angio Head W or Wo Contrast  Result Date: 07/14/2020 CLINICAL DATA:  Initial evaluation for acute slurred speech. EXAM: CT ANGIOGRAPHY HEAD AND NECK TECHNIQUE: Multidetector CT imaging of the head and neck was performed using the standard protocol during bolus administration of intravenous contrast. Multiplanar CT image reconstructions and MIPs were obtained to evaluate the vascular anatomy. Carotid stenosis measurements (when applicable) are obtained utilizing NASCET criteria, using the distal internal carotid  diameter as the denominator. CONTRAST:  154mL OMNIPAQUE IOHEXOL 350 MG/ML SOLN COMPARISON:  Prior CT from earlier same day. FINDINGS: CTA NECK FINDINGS Aortic arch: Visualized aortic arch normal caliber with normal 3 vessel morphology. Mild atheromatous change about the arch itself. No hemodynamically significant stenosis seen about the origin of the great vessels. Right carotid system: Right CCA patent from its origin to the bifurcation without stenosis. Bulky calcified plaque about the right bifurcation/proximal right ICA with associated stenosis of up to approximately 70% by NASCET criteria. Right ICA patent distally to the skull base without stenosis, dissection or occlusion. Left carotid system: Left CCA patent from its origin to the bifurcation without stenosis. Bulky calcified plaque about the left bifurcation/proximal left ICA with associated stenosis of up to approximately 70% by NASCET criteria. Left ICA patent distally to the skull base without stenosis, dissection or occlusion. Vertebral arteries: Both vertebral arteries arise from  the subclavian arteries. No proximal subclavian artery stenosis. Both vertebral arteries patent within the neck without stenosis, dissection or occlusion. Skeleton: No visible acute osseous abnormality. No discrete or worrisome osseous lesions. Advanced multilevel right-sided facet arthrosis noted throughout the cervical spine. Moderate cervical spondylosis present at C5-6 and C6-7. Other neck: No other acute soft tissue abnormality within the neck. No mass lesion or adenopathy. Upper chest: Scattered atelectatic changes noted within the visualized lungs. Visualized upper chest demonstrates no other acute finding. Review of the MIP images confirms the above findings CTA HEAD FINDINGS Anterior circulation: Petrous segments patent bilaterally. Diffuse calcified atheromatous plaque seen throughout the carotid siphons with no more than mild diffuse stenosis. A1 segments patent  bilaterally. Normal anterior communicating artery complex. Anterior cerebral arteries patent to their distal aspects without stenosis. No M1 stenosis or occlusion. Normal MCA bifurcations. Distal MCA branches well perfused and symmetric. Posterior circulation: Both vertebral arteries patent to the vertebrobasilar junction without stenosis. Both picas patent. Basilar patent to its distal aspect without stenosis. Superior cerebral arteries patent bilaterally. Right PCA supplied via the basilar. Fetal type origin of the left PCA. Short-segment moderate proximal right P2 stenosis noted (series 8, image 118). Both PCAs otherwise widely patent and well perfused to their distal aspects. Venous sinuses: Grossly patent allowing for timing the contrast bolus. Anatomic variants: Fetal type origin of the left PCA. No intracranial aneurysm. Review of the MIP images confirms the above findings IMPRESSION: 1. Negative CTA for emergent large vessel occlusion. 2. Bulky calcified atheromatous plaque about the carotid bifurcations/proximal ICAs with associated stenoses of up to 70% bilaterally. 3. Moderate short-segment proximal right P2 stenosis. 4. Fetal type origin of the left PCA. Critical Value/emergent results were called by telephone at the time of interpretation poor on 07/14/2020 at 6:02 pm to provider [facility] , who verbally acknowledged these results. Electronically Signed   By: Jeannine Boga M.D.   On: 07/14/2020 18:07   CT Angio Neck W and/or Wo Contrast  Result Date: 07/14/2020 CLINICAL DATA:  Initial evaluation for acute slurred speech. EXAM: CT ANGIOGRAPHY HEAD AND NECK TECHNIQUE: Multidetector CT imaging of the head and neck was performed using the standard protocol during bolus administration of intravenous contrast. Multiplanar CT image reconstructions and MIPs were obtained to evaluate the vascular anatomy. Carotid stenosis measurements (when applicable) are obtained utilizing NASCET criteria, using  the distal internal carotid diameter as the denominator. CONTRAST:  150mL OMNIPAQUE IOHEXOL 350 MG/ML SOLN COMPARISON:  Prior CT from earlier same day. FINDINGS: CTA NECK FINDINGS Aortic arch: Visualized aortic arch normal caliber with normal 3 vessel morphology. Mild atheromatous change about the arch itself. No hemodynamically significant stenosis seen about the origin of the great vessels. Right carotid system: Right CCA patent from its origin to the bifurcation without stenosis. Bulky calcified plaque about the right bifurcation/proximal right ICA with associated stenosis of up to approximately 70% by NASCET criteria. Right ICA patent distally to the skull base without stenosis, dissection or occlusion. Left carotid system: Left CCA patent from its origin to the bifurcation without stenosis. Bulky calcified plaque about the left bifurcation/proximal left ICA with associated stenosis of up to approximately 70% by NASCET criteria. Left ICA patent distally to the skull base without stenosis, dissection or occlusion. Vertebral arteries: Both vertebral arteries arise from the subclavian arteries. No proximal subclavian artery stenosis. Both vertebral arteries patent within the neck without stenosis, dissection or occlusion. Skeleton: No visible acute osseous abnormality. No discrete or worrisome osseous lesions. Advanced multilevel  right-sided facet arthrosis noted throughout the cervical spine. Moderate cervical spondylosis present at C5-6 and C6-7. Other neck: No other acute soft tissue abnormality within the neck. No mass lesion or adenopathy. Upper chest: Scattered atelectatic changes noted within the visualized lungs. Visualized upper chest demonstrates no other acute finding. Review of the MIP images confirms the above findings CTA HEAD FINDINGS Anterior circulation: Petrous segments patent bilaterally. Diffuse calcified atheromatous plaque seen throughout the carotid siphons with no more than mild diffuse  stenosis. A1 segments patent bilaterally. Normal anterior communicating artery complex. Anterior cerebral arteries patent to their distal aspects without stenosis. No M1 stenosis or occlusion. Normal MCA bifurcations. Distal MCA branches well perfused and symmetric. Posterior circulation: Both vertebral arteries patent to the vertebrobasilar junction without stenosis. Both picas patent. Basilar patent to its distal aspect without stenosis. Superior cerebral arteries patent bilaterally. Right PCA supplied via the basilar. Fetal type origin of the left PCA. Short-segment moderate proximal right P2 stenosis noted (series 8, image 118). Both PCAs otherwise widely patent and well perfused to their distal aspects. Venous sinuses: Grossly patent allowing for timing the contrast bolus. Anatomic variants: Fetal type origin of the left PCA. No intracranial aneurysm. Review of the MIP images confirms the above findings IMPRESSION: 1. Negative CTA for emergent large vessel occlusion. 2. Bulky calcified atheromatous plaque about the carotid bifurcations/proximal ICAs with associated stenoses of up to 70% bilaterally. 3. Moderate short-segment proximal right P2 stenosis. 4. Fetal type origin of the left PCA. Critical Value/emergent results were called by telephone at the time of interpretation poor on 07/14/2020 at 6:02 pm to provider [facility] , who verbally acknowledged these results. Electronically Signed   By: Jeannine Boga M.D.   On: 07/14/2020 18:07   MR BRAIN WO CONTRAST  Result Date: 07/15/2020 CLINICAL DATA:  Abnormal speech, code stroke follow-up EXAM: MRI HEAD WITHOUT CONTRAST TECHNIQUE: Multiplanar, multiecho pulse sequences of the brain and surrounding structures were obtained without intravenous contrast. COMPARISON:  None. FINDINGS: Brain: There is no acute infarction or intracranial hemorrhage. There is no intracranial mass, mass effect, or edema. There is no hydrocephalus or extra-axial fluid  collection. Prominence of ventricles and sulci reflects minor generalized parenchymal volume loss. Patchy and confluent areas of T2 hyperintensity in the supratentorial white matter are nonspecific but may reflect moderate chronic microvascular ischemic changes. Vascular: Major vessel flow voids at the skull base are preserved. Skull and upper cervical spine: Normal marrow signal is preserved. Sinuses/Orbits: Paranasal sinuses are aerated. Orbits are unremarkable. Other: Sella is unremarkable.  Mastoid air cells are clear. IMPRESSION: No acute infarction, hemorrhage, or mass. Moderate chronic microvascular ischemic changes. Electronically Signed   By: Macy Mis M.D.   On: 07/15/2020 10:10   ECHOCARDIOGRAM COMPLETE  Result Date: 07/15/2020    ECHOCARDIOGRAM REPORT   Patient Name:   HAIDY KACKLEY Date of Exam: 07/15/2020 Medical Rec #:  [id number]          Height:       64.0 in Accession #:    [id number]         Weight:       277.3 lb Date of Birth:  May 15, 1951          BSA:          2.248 m Patient Age:    16 years           BP:           111/51 mmHg Patient Gender: F  HR:           76 bpm. Exam Location:  Inpatient Procedure: 2D Echo, Cardiac Doppler and Color Doppler Indications:    Stroke 434.91 / I163.9  History:        Patient has no prior history of Echocardiogram examinations.                 Risk Factors:Hypertension and Diabetes.  Sonographer:    Bernadene Person RDCS Referring Phys: [id number] AMY N [facility] IMPRESSIONS  1. Left ventricular ejection fraction, by estimation, is 70 to 75%. The left ventricle has hyperdynamic function. The left ventricle has no regional wall motion abnormalities. There is moderate concentric left ventricular hypertrophy. Left ventricular diastolic parameters are consistent with Grade I diastolic dysfunction (impaired relaxation). Elevated left atrial pressure.  2. Right ventricular systolic function is normal. The right ventricular size is normal.  3. The  pericardial effusion is posterior to the left ventricle. There is no evidence of cardiac tamponade.  4. The mitral valve is normal in structure. Trivial mitral valve regurgitation. No evidence of mitral stenosis.  5. The aortic valve is normal in structure. Aortic valve regurgitation is not visualized. No aortic stenosis is present.  6. The inferior vena cava is normal in size with greater than 50% respiratory variability, suggesting right atrial pressure of 3 mmHg. Comparison(s): No prior Echocardiogram. Conclusion(s)/Recommendation(s): No intracardiac source of embolism detected on this transthoracic study. A transesophageal echocardiogram is recommended to exclude cardiac source of embolism if clinically indicated. FINDINGS  Left Ventricle: Left ventricular ejection fraction, by estimation, is 70 to 75%. The left ventricle has hyperdynamic function. The left ventricle has no regional wall motion abnormalities. The left ventricular internal cavity size was normal in size. There is moderate concentric left ventricular hypertrophy. Left ventricular diastolic parameters are consistent with Grade I diastolic dysfunction (impaired relaxation). Elevated left atrial pressure. Right Ventricle: The right ventricular size is normal. No increase in right ventricular wall thickness. Right ventricular systolic function is normal. Left Atrium: Left atrial size was normal in size. Right Atrium: Right atrial size was normal in size. Pericardium: Trivial pericardial effusion is present. The pericardial effusion is posterior to the left ventricle. There is no evidence of cardiac tamponade. Mitral Valve: The mitral valve is normal in structure. Trivial mitral valve regurgitation. No evidence of mitral valve stenosis. Tricuspid Valve: The tricuspid valve is normal in structure. Tricuspid valve regurgitation is trivial. No evidence of tricuspid stenosis. Aortic Valve: The aortic valve is normal in structure. Aortic valve regurgitation  is not visualized. No aortic stenosis is present. Pulmonic Valve: The pulmonic valve was normal in structure. Pulmonic valve regurgitation is not visualized. No evidence of pulmonic stenosis. Aorta: The aortic root is normal in size and structure. Venous: The inferior vena cava is normal in size with greater than 50% respiratory variability, suggesting right atrial pressure of 3 mmHg. IAS/Shunts: No atrial level shunt detected by color flow Doppler.  LEFT VENTRICLE PLAX 2D LVIDd:         4.30 cm  Diastology LVIDs:         2.40 cm  LV e' medial:    5.87 cm/s LV PW:         1.30 cm  LV E/e' medial:  11.9 LV IVS:        1.20 cm  LV e' lateral:   6.97 cm/s LVOT diam:     2.10 cm  LV E/e' lateral: 10.0 LV SV:  79 LV SV Index:   35 LVOT Area:     3.46 cm  RIGHT VENTRICLE RV S prime:     12.50 cm/s TAPSE (M-mode): 2.1 cm LEFT ATRIUM             Index       RIGHT ATRIUM           Index LA diam:        3.50 cm 1.56 cm/m  RA Area:     10.80 cm LA Vol (A2C):   53.2 ml 23.67 ml/m RA Volume:   19.80 ml  8.81 ml/m LA Vol (A4C):   52.0 ml 23.13 ml/m LA Biplane Vol: 52.5 ml 23.35 ml/m  AORTIC VALVE LVOT Vmax:   96.60 cm/s LVOT Vmean:  66.100 cm/s LVOT VTI:    0.229 m  AORTA Ao Root diam: 3.30 cm Ao Asc diam:  2.90 cm MITRAL VALVE MV Area (PHT): 2.62 cm    SHUNTS MV Decel Time: 289 msec    Systemic VTI:  0.23 m MV E velocity: 69.80 cm/s  Systemic Diam: 2.10 cm MV A velocity: 81.80 cm/s MV E/A ratio:  0.85 Ena Dawley MD Electronically signed by Ena Dawley MD Signature Date/Time: 07/15/2020/8:26:49 PM    Final    CT HEAD CODE STROKE WO CONTRAST`  Result Date: 07/14/2020 CLINICAL DATA:  Code stroke. Initial evaluation for acute right arm numbness, speech difficulty. EXAM: CT HEAD WITHOUT CONTRAST TECHNIQUE: Contiguous axial images were obtained from the base of the skull through the vertex without intravenous contrast. COMPARISON:  Prior CT from 03/16/2010. FINDINGS: Brain: Generalized age-related cerebral  atrophy. Patchy and confluent hypodensity within the periventricular deep white matter both cerebral hemispheres most consistent with chronic small vessel ischemic disease, moderate in nature. No acute intracranial hemorrhage. No acute large vessel territory infarct. No mass lesion or midline shift. No hydrocephalus or extra-axial fluid collection. Vascular: No hyperdense vessel. Calcified atherosclerosis at the skull base. Skull: Scalp soft tissues and calvarium within normal limits. Sinuses/Orbits: Globes and orbital soft tissues within normal limits. Paranasal sinuses are clear. Trace right mastoid effusion noted. Other: None. ASPECTS [facility] Stroke Program Early CT Score) - Ganglionic level infarction (caudate, lentiform nuclei, internal capsule, insula, M1-M3 cortex): 7 - Supraganglionic infarction (M4-M6 cortex): 3 Total score (0-10 with 10 being normal): 10 IMPRESSION: 1. No acute intracranial infarct or other abnormality. 2. ASPECTS is 10. 3. Moderately advanced chronic microvascular ischemic disease. Critical Value/emergent results were called by telephone at the time of interpretation on 07/14/2020 at 5:41 pm to provider [facility] , who verbally acknowledged these results. Electronically Signed   By: Jeannine Boga M.D.   On: 07/14/2020 17:42     Subjective: No acute issues or events overnight, left neck pain well controlled, no bleeding or decreased range of motion.  Denies nausea, vomiting, diarrhea, constipation, headache, fevers, chills.   Discharge Exam: Vitals:   07/17/20 0415 07/17/20 0758  BP: (!) 153/62 139/72  Pulse: 97 81  Resp: 12 18  Temp: 97.8 F (36.6 C) 97.9 F (36.6 C)  SpO2: 95% 100%   Vitals:   07/16/20 2149 07/16/20 2334 07/17/20 0415 07/17/20 0758  BP: (!) 140/59 (!) 171/78 (!) 153/62 139/72  Pulse: 84 88 97 81  Resp: 20 17 12 18   Temp: 97.9 F (36.6 C) 98.3 F (36.8 C) 97.8 F (36.6 C) 97.9 F (36.6 C)  TempSrc: Oral Oral Oral Oral  SpO2: 96% 94%  95% 100%  Weight:  Height:        General: Pt is alert, awake, not in acute distress Cardiovascular: RRR, S1/S2 +, no rubs, no gallops, left neck surgical wound clean dry intact no bleeding or erythema. Respiratory: CTA bilaterally, no wheezing, no rhonchi Abdominal: Soft, NT, ND, bowel sounds + Extremities: no edema, no cyanosis    The results of significant diagnostics from this hospitalization (including imaging, microbiology, ancillary and laboratory) are listed below for reference.     Microbiology: Recent Results (from the past 240 hour(s))  Respiratory Panel by RT PCR (Flu A&B, Covid) - Nasopharyngeal Swab     Status: None   Collection Time: 07/14/20  6:23 PM   Specimen: Nasopharyngeal Swab  Result Value Ref Range Status   SARS Coronavirus 2 by RT PCR NEGATIVE NEGATIVE Final    Comment: (NOTE) SARS-CoV-2 target nucleic acids are NOT DETECTED.  The SARS-CoV-2 RNA is generally detectable in upper respiratoy specimens during the acute phase of infection. The lowest concentration of SARS-CoV-2 viral copies this assay can detect is 131 copies/mL. A negative result does not preclude SARS-Cov-2 infection and should not be used as the sole basis for treatment or other patient management decisions. A negative result may occur with  improper specimen collection/handling, submission of specimen other than nasopharyngeal swab, presence of viral mutation(s) within the areas targeted by this assay, and inadequate number of viral copies (<131 copies/mL). A negative result must be combined with clinical observations, patient history, and epidemiological information. The expected result is Negative.  Fact Sheet for Patients:  PinkCheek.be  Fact Sheet for Healthcare Providers:  GravelBags.it  This test is no t yet approved or cleared by the [place] FDA and  has been authorized for detection and/or diagnosis of  SARS-CoV-2 by FDA under an Emergency Use Authorization (EUA). This EUA will remain  in effect (meaning this test can be used) for the duration of the COVID-19 declaration under Section 564(b)(1) of the Act, 21 U.S.C. section 360bbb-3(b)(1), unless the authorization is terminated or revoked sooner.     Influenza A by PCR NEGATIVE NEGATIVE Final   Influenza B by PCR NEGATIVE NEGATIVE Final    Comment: (NOTE) The Xpert Xpress SARS-CoV-2/FLU/RSV assay is intended as an aid in  the diagnosis of influenza from Nasopharyngeal swab specimens and  should not be used as a sole basis for treatment. Nasal washings and  aspirates are unacceptable for Xpert Xpress SARS-CoV-2/FLU/RSV  testing.  Fact Sheet for Patients: PinkCheek.be  Fact Sheet for Healthcare Providers: GravelBags.it  This test is not yet approved or cleared by the [place] FDA and  has been authorized for detection and/or diagnosis of SARS-CoV-2 by  FDA under an Emergency Use Authorization (EUA). This EUA will remain  in effect (meaning this test can be used) for the duration of the  Covid-19 declaration under Section 564(b)(1) of the Act, 21  U.S.C. section 360bbb-3(b)(1), unless the authorization is  terminated or revoked. Performed at [facility], [address], [place], [place] [address]   Surgical pcr screen     Status: None   Collection Time: 07/16/20  8:33 AM   Specimen: Nasal Mucosa; Nasal Swab  Result Value Ref Range Status   MRSA, PCR NEGATIVE NEGATIVE Final   Staphylococcus aureus NEGATIVE NEGATIVE Final    Comment: (NOTE) The Xpert SA Assay (FDA approved for NASAL specimens in patients 50 years of age and older), is one component of a comprehensive surveillance program. It is not intended to diagnose  infection nor to guide or monitor treatment. Performed at [facility], [place] [address], [place], [place] [address]       Labs: BNP (last 3 results) No results for input(s): BNP in the last 8760 hours. Basic Metabolic Panel: Recent Labs  Lab 07/14/20 1635 07/16/20 0125 07/16/20 1649 07/17/20 0402  NA 128* 132*  --  133*  K 3.5 3.4*  --  4.2  CL 89* 95*  --  98  CO2 25 26  --  24  GLUCOSE 167* 70  --  304*  BUN 19 20  --  17  CREATININE 1.61* 1.65* 1.61* 1.50*  CALCIUM 9.5 9.3  --  9.0   Liver Function Tests: Recent Labs  Lab 07/14/20 1635 07/16/20 0125 07/17/20 0402  AST 42* 43* 39  ALT 44 40 41  ALKPHOS 69 61 56  BILITOT 1.2 1.1 1.3*  PROT 7.5 6.3* 6.1*  ALBUMIN 4.0 3.5 3.3*   No results for input(s): LIPASE, AMYLASE in the last 168 hours. No results for input(s): AMMONIA in the last 168 hours. CBC: Recent Labs  Lab 07/14/20 1635 07/16/20 0125 07/16/20 1649 07/17/20 0402  WBC 12.8* 11.8* 14.5* 15.1*  NEUTROABS 5.8  --   --   --   HGB 15.4* 13.1 13.6 13.0  HCT 44.5 37.9 39.8 38.1  MCV 89.4 91.3 90.2 91.8  PLT 255 224 229 208   Cardiac Enzymes: Recent Labs  Lab 07/15/20 0242  CKTOTAL 178   BNP: Invalid input(s): POCBNP CBG: Recent Labs  Lab 07/16/20 1832 07/16/20 2129 07/16/20 2340 07/17/20 0639 07/17/20 1110  GLUCAP 279* 369* 319* 287* 332*   D-Dimer No results for input(s): DDIMER in the last 72 hours. Hgb A1c Recent Labs    07/15/20 0242 07/16/20 1649  HGBA1C 9.0* 9.0*   Lipid Profile Recent Labs    07/15/20 0242  CHOL 203*  HDL 52  LDLCALC 123*  TRIG 139  CHOLHDL 3.9   Thyroid function studies No results for input(s): TSH, T4TOTAL, T3FREE, THYROIDAB in the last 72 hours.  Invalid input(s): FREET3 Anemia work up No results for input(s): VITAMINB12, FOLATE, FERRITIN, TIBC, IRON, RETICCTPCT in the last 72 hours. Urinalysis    Component Value Date/Time   COLORURINE YELLOW 07/14/2020 2158   APPEARANCEUR CLEAR 07/14/2020 2158   LABSPEC <1.005 (L) 07/14/2020 2158   PHURINE 6.0 07/14/2020 2158   GLUCOSEU 250 (A) 07/14/2020 2158   HGBUR  NEGATIVE 07/14/2020 2158   BILIRUBINUR NEGATIVE 07/14/2020 2158   KETONESUR NEGATIVE 07/14/2020 2158   PROTEINUR NEGATIVE 07/14/2020 2158   UROBILINOGEN 0.2 03/18/2012 2039   NITRITE NEGATIVE 07/14/2020 2158   LEUKOCYTESUR NEGATIVE 07/14/2020 2158   Sepsis Labs Invalid input(s): PROCALCITONIN,  WBC,  LACTICIDVEN Microbiology Recent Results (from the past 240 hour(s))  Respiratory Panel by RT PCR (Flu A&B, Covid) - Nasopharyngeal Swab     Status: None   Collection Time: 07/14/20  6:23 PM   Specimen: Nasopharyngeal Swab  Result Value Ref Range Status   SARS Coronavirus 2 by RT PCR NEGATIVE NEGATIVE Final    Comment: (NOTE) SARS-CoV-2 target nucleic acids are NOT DETECTED.  The SARS-CoV-2 RNA is generally detectable in upper respiratoy specimens during the acute phase of infection. The lowest concentration of SARS-CoV-2 viral copies this assay can detect is 131 copies/mL. A negative result does not preclude SARS-Cov-2 infection and should not be used as the sole basis for treatment or other patient management decisions. A negative result may occur with  improper  specimen collection/handling, submission of specimen other than nasopharyngeal swab, presence of viral mutation(s) within the areas targeted by this assay, and inadequate number of viral copies (<131 copies/mL). A negative result must be combined with clinical observations, patient history, and epidemiological information. The expected result is Negative.  Fact Sheet for Patients:  PinkCheek.be  Fact Sheet for Healthcare Providers:  GravelBags.it  This test is no t yet approved or cleared by the [place] FDA and  has been authorized for detection and/or diagnosis of SARS-CoV-2 by FDA under an Emergency Use Authorization (EUA). This EUA will remain  in effect (meaning this test can be used) for the duration of the COVID-19 declaration under Section 564(b)(1)  of the Act, 21 U.S.C. section 360bbb-3(b)(1), unless the authorization is terminated or revoked sooner.     Influenza A by PCR NEGATIVE NEGATIVE Final   Influenza B by PCR NEGATIVE NEGATIVE Final    Comment: (NOTE) The Xpert Xpress SARS-CoV-2/FLU/RSV assay is intended as an aid in  the diagnosis of influenza from Nasopharyngeal swab specimens and  should not be used as a sole basis for treatment. Nasal washings and  aspirates are unacceptable for Xpert Xpress SARS-CoV-2/FLU/RSV  testing.  Fact Sheet for Patients: PinkCheek.be  Fact Sheet for Healthcare Providers: GravelBags.it  This test is not yet approved or cleared by the [place] FDA and  has been authorized for detection and/or diagnosis of SARS-CoV-2 by  FDA under an Emergency Use Authorization (EUA). This EUA will remain  in effect (meaning this test can be used) for the duration of the  Covid-19 declaration under Section 564(b)(1) of the Act, 21  U.S.C. section 360bbb-3(b)(1), unless the authorization is  terminated or revoked. Performed at [facility], [address], [place], [place] [address]   Surgical pcr screen     Status: None   Collection Time: 07/16/20  8:33 AM   Specimen: Nasal Mucosa; Nasal Swab  Result Value Ref Range Status   MRSA, PCR NEGATIVE NEGATIVE Final   Staphylococcus aureus NEGATIVE NEGATIVE Final    Comment: (NOTE) The Xpert SA Assay (FDA approved for NASAL specimens in patients 62 years of age and older), is one component of a comprehensive surveillance program. It is not intended to diagnose infection nor to guide or monitor treatment. Performed at [facility], [place] [address] [place], [place] [address]    Time coordinating discharge: Over 30 minutes  SIGNED:  Little Ishikawa, DO [facility] 07/17/2020, 2:34 PM Pager   If 7PM-7AM, please contact night-coverage [email or web address]

## 2020-07-17 NOTE — Progress Notes (Signed)
Discharged to home with family office visits in place teaching done  

## 2020-07-17 NOTE — Progress Notes (Addendum)
[facility]  Subjective  - Doing well with out new complaints.   Objective 139/72 81 97.9 F (36.6 C) (Oral) 18 100%  Intake/Output Summary (Last 24 hours) at 07/17/2020 0832 Last data filed at 07/16/2020 1500 Gross per 24 hour  Intake 1000 ml  Output 75 ml  Net 925 ml    Left neck incision without hematoma, minimal edema and ecchymosis. Moving all 4 ext. No tongue deviation, no facial droop.  Speech clear.   Assessment/Planning: POD # 1 left CEA  No neurologic deficits.  Patient has ambulated in the halls, tolerating po's well and voided. Discharge on ASA 325 and Plavix daily F/U with Dr. Donzetta Matters in 2-3 weeks.   Stable from a vascular point of view.  Roxy Horseman 07/17/2020 8:32 AM --  Laboratory Lab Results: Recent Labs    07/16/20 1649 07/17/20 0402  WBC 14.5* 15.1*  HGB 13.6 13.0  HCT 39.8 38.1  PLT 229 208   BMET Recent Labs    07/16/20 0125 07/16/20 0125 07/16/20 1649 07/17/20 0402  NA 132*  --   --  133*  K 3.4*  --   --  4.2  CL 95*  --   --  98  CO2 26  --   --  24  GLUCOSE 70  --   --  304*  BUN 20  --   --  17  CREATININE 1.65*   < > 1.61* 1.50*  CALCIUM 9.3  --   --  9.0   < > = values in this interval not displayed.    COAG Lab Results  Component Value Date   INR 1.0 07/14/2020   INR 0.95 04/02/2012   No results found for: PTT  I agree with the above.  I have seen and evaluated the patient.  She is postoperative day #1, status post left carotid endarterectomy.  She has had no changes in her neurologic function since her surgery.  Her expressive aphasia is improving.  She is stable for discharge from a vascular surgery perspective.  Annamarie Major

## 2020-07-19 ENCOUNTER — Encounter (HOSPITAL_COMMUNITY): Payer: Self-pay | Admitting: Vascular Surgery

## 2020-07-19 ENCOUNTER — Other Ambulatory Visit (HOSPITAL_BASED_OUTPATIENT_CLINIC_OR_DEPARTMENT_OTHER): Payer: Self-pay | Admitting: Endocrinology

## 2020-07-19 MED FILL — COLCHICINE 0.6 MG TABS: 0.6 | 30 days supply | Qty: 30 | Fill #0

## 2020-07-19 MED FILL — ATORVASTATIN 80 MG TABLET: 80 | 30 days supply | Qty: 30 | Fill #0

## 2020-07-27 DIAGNOSIS — Z1231 Encounter for screening mammogram for malignant neoplasm of breast: Secondary | ICD-10-CM | POA: Diagnosis not present

## 2020-07-27 DIAGNOSIS — E785 Hyperlipidemia, unspecified: Secondary | ICD-10-CM | POA: Diagnosis not present

## 2020-07-27 DIAGNOSIS — R2689 Other abnormalities of gait and mobility: Secondary | ICD-10-CM | POA: Diagnosis not present

## 2020-07-27 DIAGNOSIS — E1169 Type 2 diabetes mellitus with other specified complication: Secondary | ICD-10-CM | POA: Diagnosis not present

## 2020-07-27 DIAGNOSIS — I152 Hypertension secondary to endocrine disorders: Secondary | ICD-10-CM | POA: Diagnosis not present

## 2020-07-27 DIAGNOSIS — G459 Transient cerebral ischemic attack, unspecified: Secondary | ICD-10-CM | POA: Diagnosis not present

## 2020-07-27 DIAGNOSIS — Z9889 Other specified postprocedural states: Secondary | ICD-10-CM | POA: Diagnosis not present

## 2020-07-27 DIAGNOSIS — E1159 Type 2 diabetes mellitus with other circulatory complications: Secondary | ICD-10-CM | POA: Diagnosis not present

## 2020-08-02 ENCOUNTER — Other Ambulatory Visit (HOSPITAL_BASED_OUTPATIENT_CLINIC_OR_DEPARTMENT_OTHER): Payer: Self-pay | Admitting: Endocrinology

## 2020-08-02 MED FILL — HUMULIN R 500 UNITS/ML KWIK: 500 | 30 days supply | Qty: 12 | Fill #0

## 2020-08-02 MED FILL — FREESTYLE LIBRE 2 SENSOR MI: 28 days supply | Qty: 2 | Fill #8

## 2020-08-03 MED FILL — PENTIPS 31G X 5 MM MISC: 31G X 5 MM | 67 days supply | Qty: 200 | Fill #0

## 2020-08-03 MED FILL — SM FIBER 625 MG TABS: 625 | 90 days supply | Qty: 90 | Fill #0

## 2020-08-06 ENCOUNTER — Other Ambulatory Visit: Payer: Self-pay

## 2020-08-06 ENCOUNTER — Other Ambulatory Visit: Payer: Self-pay | Admitting: Vascular Surgery

## 2020-08-06 ENCOUNTER — Encounter: Payer: Self-pay | Admitting: Vascular Surgery

## 2020-08-06 ENCOUNTER — Ambulatory Visit (INDEPENDENT_AMBULATORY_CARE_PROVIDER_SITE_OTHER): Payer: Self-pay | Admitting: Vascular Surgery

## 2020-08-06 ENCOUNTER — Other Ambulatory Visit: Payer: Self-pay | Admitting: Family Medicine

## 2020-08-06 VITALS — BP 142/88 | HR 79 | Temp 97.5°F | Resp 20 | Ht 64.0 in | Wt 273.0 lb

## 2020-08-06 DIAGNOSIS — Z1231 Encounter for screening mammogram for malignant neoplasm of breast: Secondary | ICD-10-CM

## 2020-08-06 DIAGNOSIS — I6523 Occlusion and stenosis of bilateral carotid arteries: Secondary | ICD-10-CM

## 2020-08-06 MED ORDER — CLOPIDOGREL BISULFATE 75 MG PO TABS: 75.0000 mg | ORAL_TABLET | Freq: Every day | ORAL | 4 refills | Status: DC

## 2020-08-06 MED ORDER — ATORVASTATIN CALCIUM 80 MG PO TABS: 80.0000 mg | ORAL_TABLET | Freq: Every day | ORAL | 4 refills | Status: DC

## 2020-08-06 NOTE — Progress Notes (Signed)
    Subjective:     Patient ID: Kristine Gray, female   DOB: 1950/12/07, 69 y.o.   MRN: [id number]  HPI 69 year old female status post left carotid endarterectomy.  At the time she was hospitalized for episode of aphasia was considered to be left MCA distribution TIA.  She is now on aspirin, Plavix, Lipitor.  She did have some confusion after surgery this was thought secondary to anesthesia but otherwise has recovered well.  Review of Systems No issues today    Objective:   Physical Exam Vitals:   08/06/20 0819 08/06/20 0822  BP: 137/82 (!) 142/88  Pulse: 79   Resp: 20   Temp: (!) 97.5 F (36.4 C)   SpO2: 95%   Awake alert oriented Left neck incision healing well Neuro intact     Assessment:     69 year old female status post left carotid endarterectomy for symptomatic disease with  Left brain TIA.  Plan:     Refill Plavix and Lipitor today.  She will continue on baby aspirin as well Follow-up in 3 to 4 months with carotid duplex.  This is earlier than the expected follow-up time but given her previous high-grade stenosis on the right by CTA I would like to evaluate this with duplex.  Galia Rahm C. Donzetta Matters, MD [facility] Office: [phone] Pager: [phone]

## 2020-08-09 MED FILL — BYSTOLIC 10 MG TABLET: 10 | 30 days supply | Qty: 60 | Fill #0

## 2020-08-09 MED FILL — CLOPIDOGREL 75 MG TABLET: 75 | 90 days supply | Qty: 90 | Fill #0

## 2020-08-11 ENCOUNTER — Other Ambulatory Visit: Payer: Self-pay

## 2020-08-11 DIAGNOSIS — I6523 Occlusion and stenosis of bilateral carotid arteries: Secondary | ICD-10-CM

## 2020-08-11 MED FILL — ATORVASTATIN 80 MG TABLET: 80 | 90 days supply | Qty: 90 | Fill #0

## 2020-08-12 ENCOUNTER — Telehealth: Payer: Self-pay

## 2020-08-12 DIAGNOSIS — E049 Nontoxic goiter, unspecified: Secondary | ICD-10-CM | POA: Diagnosis not present

## 2020-08-12 DIAGNOSIS — Z794 Long term (current) use of insulin: Secondary | ICD-10-CM | POA: Diagnosis not present

## 2020-08-12 DIAGNOSIS — N1832 Chronic kidney disease, stage 3b: Secondary | ICD-10-CM | POA: Diagnosis not present

## 2020-08-12 DIAGNOSIS — I5189 Other ill-defined heart diseases: Secondary | ICD-10-CM | POA: Diagnosis not present

## 2020-08-12 DIAGNOSIS — I10 Essential (primary) hypertension: Secondary | ICD-10-CM | POA: Diagnosis not present

## 2020-08-12 DIAGNOSIS — E871 Hypo-osmolality and hyponatremia: Secondary | ICD-10-CM | POA: Diagnosis not present

## 2020-08-12 DIAGNOSIS — I129 Hypertensive chronic kidney disease with stage 1 through stage 4 chronic kidney disease, or unspecified chronic kidney disease: Secondary | ICD-10-CM | POA: Diagnosis not present

## 2020-08-12 DIAGNOSIS — E785 Hyperlipidemia, unspecified: Secondary | ICD-10-CM | POA: Diagnosis not present

## 2020-08-12 DIAGNOSIS — I6523 Occlusion and stenosis of bilateral carotid arteries: Secondary | ICD-10-CM | POA: Diagnosis not present

## 2020-08-12 DIAGNOSIS — I700 Atherosclerosis of aorta: Secondary | ICD-10-CM | POA: Diagnosis not present

## 2020-08-12 DIAGNOSIS — E1129 Type 2 diabetes mellitus with other diabetic kidney complication: Secondary | ICD-10-CM | POA: Diagnosis not present

## 2020-08-12 NOTE — Telephone Encounter (Signed)
Pt's spouse called with c/o pt having balance concerns this morning, she feels like she is falling to the L side and went down on her knees earlier. She is also c/o "dizzy spells". Per APP, she would need to be evaluated in the ED for these neurological symptoms. Pt's spouse is aware and verbalized understanding. They will call us back if they have further questions/concerns.

## 2020-08-17 ENCOUNTER — Other Ambulatory Visit: Payer: Self-pay

## 2020-08-17 ENCOUNTER — Encounter: Payer: Self-pay | Admitting: Adult Health

## 2020-08-17 ENCOUNTER — Ambulatory Visit
Admission: RE | Admit: 2020-08-17 | Discharge: 2020-08-17 | Disposition: A | Discharge status: Home / Self Care | Payer: PPO | Source: Ambulatory Visit | Attending: Family Medicine | Admitting: Family Medicine

## 2020-08-17 ENCOUNTER — Ambulatory Visit: Payer: PPO | Admitting: Adult Health

## 2020-08-17 VITALS — BP 147/77 | HR 79 | Ht 64.0 in | Wt 272.0 lb

## 2020-08-17 DIAGNOSIS — I10 Essential (primary) hypertension: Secondary | ICD-10-CM

## 2020-08-17 DIAGNOSIS — E1149 Type 2 diabetes mellitus with other diabetic neurological complication: Secondary | ICD-10-CM | POA: Diagnosis not present

## 2020-08-17 DIAGNOSIS — Z794 Long term (current) use of insulin: Secondary | ICD-10-CM

## 2020-08-17 DIAGNOSIS — I6523 Occlusion and stenosis of bilateral carotid arteries: Secondary | ICD-10-CM | POA: Diagnosis not present

## 2020-08-17 DIAGNOSIS — E785 Hyperlipidemia, unspecified: Secondary | ICD-10-CM

## 2020-08-17 DIAGNOSIS — G459 Transient cerebral ischemic attack, unspecified: Secondary | ICD-10-CM

## 2020-08-17 DIAGNOSIS — Z1231 Encounter for screening mammogram for malignant neoplasm of breast: Secondary | ICD-10-CM

## 2020-08-17 NOTE — Patient Instructions (Signed)
Please ensure you are further evaluated at ED if you have any additional vertigo type episodes as well contacting Dr. Donzetta Matters for further recommendations   Continue aspirin 81 mg daily and clopidogrel 75 mg daily  and atrovastatin  for secondary stroke prevention  Continue to follow up with PCP regarding cholesterol, and blood pressure management  Continue to follow with endocrinology for diabetes management Maintain strict control of hypertension with blood pressure goal below 130/90, diabetes with hemoglobin A1c goal below 7% and cholesterol with LDL cholesterol (bad cholesterol) goal below 70 mg/dL.       Followup in the future with me in 4 months or call earlier if needed       Thank you for coming to see [place] at [facility]. I hope we have been able to provide you high quality care today.  You may receive a patient satisfaction survey over the next few weeks. We would appreciate your feedback and comments so that we may continue to improve ourselves and the health of our patients.

## 2020-08-17 NOTE — Progress Notes (Signed)
[facility] [address] [place]. [place] [address] [phone]       HOSPITAL FOLLOW UP NOTE  [name] Date of Birth:  [birth year] Medical Record Number:  [id number]   Reason for Referral:  hospital stroke follow up    SUBJECTIVE:   CHIEF COMPLAINT:  Chief Complaint  Patient presents with  . Hospitalization Follow-up    rm 9, reports a fall on 08/11/20, c/o balance issues     HPI:   [name] is a 69 y.o. female with history of HTN, DM, renal calculi, diverticulosis, arthritis and amenia who presented to [facility] [facility] 07/14/20 with transient acute R hand sensory numbness which progressed to acute onset aphasia - slurred speech and difficulty getting her words out which lasted 5 to 10 minutes.  Personally reviewed hospitalization pertinent progress notes, lab work and imaging with summary provided.  She was transferred to [facility] evaluated by Dr. Erlinda Hong with MRI negative for acute abnormality and likely L brain TIA secondary to symptomatic L ICA stenosis with CTA showing bilateral ICA 70% stenosis s/p CEA 33/29 without complication.  Recommended DAPT for 3 weeks then Plavix alone as on aspirin PTA.  HTN stable and recommend long-term BP goal 130-150 given right ICA stenosis.  LDL 123 and increased home dose atorvastatin 20 mg to 80 mg daily.  Uncontrolled DM with A1c 9.0.  Other stroke risk factors include advanced age and morbid obesity but no prior stroke history.  She was discharged home in stable condition without therapy needs.   L brainTIAd/t symptomatic L ICA stenosis  Code Stroke CT head No acute abnormality. Moderate Small vessel disease.   CTA head & neck no ELVO. B ICA bulky plaque 70%.Moderate short proximal R P2 stenosis. Fetal L PCA.  MRI  No acuteabnormality. ModerateSmall vessel disease.   2D Echo EF 70-75%. No source of embolus   LDL 123  HgbA1c 9.0  VTE prophylaxis - SCDs   aspirin 162mg  daily prior  to admission, now on aspirin 325 mg daily and clopidogrel 75 mg daily following plavix load. Continue DAPT for 3 weeks and the plavix alone if OK with VVS.   Therapy recommendations:  No therapy needs  Disposition:  Return home  Today, 08/17/2020, Kristine Gray is being seen for hospital follow-up unaccompanied.  She initially was doing well after discharge but had episode of vertigo resulting in a fall on 11/18 at 2 AM.  Upon walking to the bathroom, she reports dizziness type sensation and then had difficulty standing upright, was able to sit on the toilet but felt as though she was getting pulled to her left side and ended up falling.  She does have a large area of bruising on her left side but no other injury.  She will experience occasional dizziness upon standing which resolves quickly.  She has not experienced any additional vertigo episodes.  She does report sensation of dizziness/vertigo prior to her CEA but resolved without any recurrence until 11/18.  She did not check her blood pressure during that time but did check BG which was normal.  Denies any other stroke/TIA symptoms.  She remains on aspirin 81 mg daily and Plavix with bruising post fall but denies bleeding.  Plans on follow-up with Dr. Donzetta Matters in 11/2020 to reevaluate carotid stenosis.  Also remains on atorvastatin 80 mg daily without myalgias.  Blood pressure today 147/77 monitors at home with SBP typically 140s.  Glucose levels have been stable currently managed  by endocrinology Dr. Forde Dandy.  No further concerns at this time.     ROS:   14 system review of systems performed and negative with exception of imbalance, vertigo, dizziness  PMH:  Past Medical History:  Diagnosis Date  . Anemia   . Arthritis 04-02-12   arthritis lower back and spurs  . Carotid artery occlusion   . Diabetes mellitus 04-02-12   oral med and insulin used  . Diverticulosis of colon 04-02-12   hx. of, no problems  . H/O nephrostomy 04-02-12   03-19-12 tube  placed and remains lt. flank.  . Hypertension   . Kidney calculi 04-02-12   hx. kidney stones-left, past hx. kidney stones  . Obesity     PSH:  Past Surgical History:  Procedure Laterality Date  . CESAREAN SECTION  04-02-12   '86  . CHOLECYSTECTOMY    . DILATION AND CURETTAGE OF UTERUS  04-02-12  . ENDARTERECTOMY Left 07/16/2020   Procedure: ENDARTERECTOMY CAROTID;  Surgeon: Rosetta Posner, MD;  Location: [facility] OR;  Service: Vascular;  Laterality: Left;  . iriodotomy  04-02-12   bil. eyes  . NEPHROLITHOTOMY  04/09/2012   Procedure: NEPHROLITHOTOMY PERCUTANEOUS;  Surgeon: Fredricka Bonine, MD;  Location: [facility] ORS;  Service: Urology;  Laterality: Left;  . TONSILLECTOMY    . TUBAL LIGATION  04-02-12    '86    Social History:  Social History   Socioeconomic History  . Marital status: Married    Spouse name: Not on file  . Number of children: Not on file  . Years of education: Not on file  . Highest education level: Not on file  Occupational History  . Not on file  Tobacco Use  . Smoking status: Never Smoker  . Smokeless tobacco: Never Used  Vaping Use  . Vaping Use: Never used  Substance and Sexual Activity  . Alcohol use: No  . Drug use: No  . Sexual activity: Yes  Other Topics Concern  . Not on file  Social History Narrative  . Not on file   Social Determinants of Health   Financial Resource Strain:   . Difficulty of Paying Living Expenses: Not on file  Food Insecurity:   . Worried About Charity fundraiser in the Last Year: Not on file  . Ran Out of Food in the Last Year: Not on file  Transportation Needs:   . Lack of Transportation (Medical): Not on file  . Lack of Transportation (Non-Medical): Not on file  Physical Activity:   . Days of Exercise per Week: Not on file  . Minutes of Exercise per Session: Not on file  Stress:   . Feeling of Stress : Not on file  Social Connections:   . Frequency of Communication with Friends and Family: Not on file  . Frequency  of Social Gatherings with Friends and Family: Not on file  . Attends Religious Services: Not on file  . Active Member of Clubs or Organizations: Not on file  . Attends Archivist Meetings: Not on file  . Marital Status: Not on file  Intimate Partner Violence:   . Fear of Current or Ex-Partner: Not on file  . Emotionally Abused: Not on file  . Physically Abused: Not on file  . Sexually Abused: Not on file    Family History:  Family History  Problem Relation Age of Onset  . Cancer Other   . Hypertension Other   . Hyperlipidemia Other   . Obesity Other   .  Heart attack Other   . Breast cancer Maternal Aunt        in mid 60's    Medications:   Current Outpatient Medications on File Prior to Visit  Medication Sig Dispense Refill  . acetaminophen (TYLENOL) 650 MG CR tablet Take 650 mg by mouth every 8 (eight) hours as needed for pain.    Marland Kitchen allopurinol (ZYLOPRIM) 100 MG tablet Take 100 mg by mouth daily.     Marland Kitchen aspirin 325 MG EC tablet Take 1 tablet (325 mg total) by mouth daily with breakfast. (Patient taking differently: Take 81 mg by mouth daily with breakfast. ) 27 tablet 0  . atorvastatin (LIPITOR) 80 MG tablet Take 1 tablet (80 mg total) by mouth daily. 90 tablet 4  . clopidogrel (PLAVIX) 75 MG tablet Take 1 tablet (75 mg total) by mouth daily. 90 tablet 4  . colchicine 0.6 MG tablet Take 1 tablet (0.6 mg total) by mouth daily as needed (gout flare). 30 tablet 0  . gabapentin (NEURONTIN) 300 MG capsule Take 300 mg by mouth daily.    . hydrochlorothiazide (HYDRODIURIL) 25 MG tablet Take 25 mg by mouth daily.    . insulin regular human CONCENTRATED (HUMULIN R) 500 UNIT/ML SOLN injection Inject 40-120 Units into the skin See admin instructions. 120 units when she wakes up ~ 12 noon , 70 units at 6 PM and 40-50 units at bedtime (0200-0400 AM)    . nebivolol (BYSTOLIC) 10 MG tablet Take 10 mg by mouth 2 (two) times daily.    . polycarbophil (FIBERCON) 625 MG tablet Take 1  tablet (625 mg total) by mouth daily. 30 tablet 0  . polyvinyl alcohol (LIQUIFILM TEARS) 1.4 % ophthalmic solution Place 1 drop into both eyes as needed for dry eyes.    . vitamin B-12 (CYANOCOBALAMIN) 1000 MCG tablet Take 1,000 mcg by mouth daily.    Marland Kitchen VITAMIN D PO Take 1 capsule by mouth daily.    . vitamin E 400 UNIT capsule Take 400 Units by mouth daily.     No current facility-administered medications on file prior to visit.    Allergies:   Allergies  Allergen Reactions  . Penicillins Hives, Itching and Swelling  . Valsartan Other (See Comments)    Bradycardia      OBJECTIVE:  Physical Exam  Vitals:   08/17/20 1358  BP: (!) 147/77  Pulse: 79  Weight: 272 lb (123.4 kg)  Height: 5\' 4"  (1.626 m)   Body mass index is 46.69 kg/m. No exam data present  Depression screen [facility] 2/9 08/17/2020  Decreased Interest 0  Down, Depressed, Hopeless 0  PHQ - 2 Score 0     General: Morbidly obese very pleasant elderly Caucasian female, seated, in no evident distress Head: head normocephalic and atraumatic.   Neck: supple with no carotid or supraclavicular bruits Cardiovascular: regular rate and rhythm, no murmurs Musculoskeletal: no deformity Skin:  no rash/petichiae Vascular:  Normal pulses all extremities   Neurologic Exam Mental Status: Awake and fully alert.   Fluent speech and language.  Oriented to place and time. Recent and remote memory intact. Attention span, concentration and fund of knowledge appropriate. Mood and affect appropriate.  Cranial Nerves: Fundoscopic exam reveals sharp disc margins. Pupils equal, briskly reactive to light. Extraocular movements full without nystagmus. Visual fields full to confrontation. Hearing intact. Facial sensation intact. Face, tongue, palate moves normally and symmetrically.  Motor: Normal bulk and tone. Normal strength in all tested extremity muscles. Sensory.: intact to  touch , pinprick , position and vibratory sensation.   Coordination: Rapid alternating movements normal in all extremities. Finger-to-nose and heel-to-shin performed accurately bilaterally. Gait and Station: Arises from chair without difficulty. Stance is normal. Gait demonstrates normal stride length and mild imbalance with use of cane Reflexes: 1+ and symmetric. Toes downgoing.     NIHSS  0 Modified Rankin  2     ASSESSMENT: Kristine Gray is a 69 y.o. year old female presented with transient acute right hand sensory numbness which progressed to aphasia and slurred speech lasting approximately 5 to 10 minutes on 07/14/2020 likely left brain TIA secondary to symptomatic left ICA stenosis s/p CEA. Vascular risk factors include carotid stenosis, HTN, HLD, uncontrolled DM, advanced age and morbid obesity.      PLAN:  1. TIA :  a. Residual mild imbalance with one episode of vertigo leading to a fall.  She denies interest in participating with therapy and will continue to do exercises at home.  She was advised to call office or speak further with PCP if she wishes to participate in outpatient therapy.  She was advised to call 911 immediately with any reoccurrence or new stroke/TIA symptoms as well as contact vascular surgery.   b. Continue aspirin 81 mg daily and clopidogrel 75 mg daily  and atorvastatin for secondary stroke prevention.  Advised ongoing DAPT not indicated from a stroke standpoint but is indicated from VVS standpoint -advised ongoing duration will be determined by Dr. Donzetta Matters.   c. Discussed secondary stroke prevention measures and importance of close PCP follow up for aggressive stroke risk factor management  2. Carotid stenosis: s/p L CEA; R ICA 70% stenosis; recent follow-up with Dr. Donzetta Matters VVS recommended continuation of DAPT and statin and plans on follow-up carotid duplex in 3 to 4 months.  3. HTN: BP goal 130-150 d/t carotid stenosis.  Stable on hydrochlorothiazide and Bystolic per PCP 4. HLD: LDL goal <70. Recent LDL 123.   Increased atorvastatin dosage to 80 mg daily during stroke admission.  Request follow-up with PCP in the next 1 to 2 months for repeat lipid panel and ongoing prescribing of atorvastatin 5. DMII: A1c goal<7.0. Recent A1c 9.0. On Humulin per PCP    Follow up in 4 months or call earlier if needed   CC:  [place] provider:  Dr. Orlena Sheldon, MD    I spent 45 minutes of face-to-face and non-face-to-face time with patient.  This included previsit chart review including recent hospitalization pertinent progress notes, lab work and imaging, lab review, study review, order entry, electronic health record documentation, patient education regarding recent TIA, carotid stenosis, episode of vertigo and imbalance, importance of managing stroke risk factors and answered all other questions to patient satisfaction   Frann Rider, AGNP-BC  [facility] [address] [place], [place] [address]  Phone [phone] Fax [phone] Note: This document was prepared with digital dictation and possible smart phrase technology. Any transcriptional errors that result from this process are unintentional.

## 2020-08-18 ENCOUNTER — Other Ambulatory Visit: Payer: Self-pay | Admitting: Family Medicine

## 2020-08-18 DIAGNOSIS — R928 Other abnormal and inconclusive findings on diagnostic imaging of breast: Secondary | ICD-10-CM

## 2020-08-18 NOTE — Progress Notes (Signed)
I agree with the above plan 

## 2020-09-02 ENCOUNTER — Other Ambulatory Visit: Payer: Self-pay

## 2020-09-02 ENCOUNTER — Ambulatory Visit
Admission: RE | Admit: 2020-09-02 | Discharge: 2020-09-02 | Disposition: A | Discharge status: Home / Self Care | Payer: PPO | Source: Ambulatory Visit | Attending: Family Medicine | Admitting: Family Medicine

## 2020-09-02 DIAGNOSIS — R928 Other abnormal and inconclusive findings on diagnostic imaging of breast: Secondary | ICD-10-CM | POA: Diagnosis not present

## 2020-09-02 DIAGNOSIS — N6489 Other specified disorders of breast: Secondary | ICD-10-CM | POA: Diagnosis not present

## 2020-09-03 ENCOUNTER — Other Ambulatory Visit (HOSPITAL_BASED_OUTPATIENT_CLINIC_OR_DEPARTMENT_OTHER): Payer: Self-pay | Admitting: Endocrinology

## 2020-09-03 MED FILL — FREESTYLE LIBRE 2 SENSOR MI: 28 days supply | Qty: 2 | Fill #9

## 2020-09-13 ENCOUNTER — Other Ambulatory Visit (HOSPITAL_BASED_OUTPATIENT_CLINIC_OR_DEPARTMENT_OTHER): Payer: Self-pay | Admitting: Endocrinology

## 2020-09-13 MED FILL — HUMULIN R 500 UNITS/ML KWIK: 500 | 88 days supply | Qty: 36 | Fill #0

## 2020-09-14 MED FILL — BYSTOLIC 10 MG TABLET: 10 | 30 days supply | Qty: 60 | Fill #1

## 2020-09-21 ENCOUNTER — Other Ambulatory Visit (HOSPITAL_BASED_OUTPATIENT_CLINIC_OR_DEPARTMENT_OTHER): Payer: Self-pay | Admitting: Pharmacotherapy

## 2020-09-21 DIAGNOSIS — Z794 Long term (current) use of insulin: Secondary | ICD-10-CM | POA: Diagnosis not present

## 2020-09-21 DIAGNOSIS — E1129 Type 2 diabetes mellitus with other diabetic kidney complication: Secondary | ICD-10-CM | POA: Diagnosis not present

## 2020-09-21 DIAGNOSIS — I129 Hypertensive chronic kidney disease with stage 1 through stage 4 chronic kidney disease, or unspecified chronic kidney disease: Secondary | ICD-10-CM | POA: Diagnosis not present

## 2020-09-21 DIAGNOSIS — N1832 Chronic kidney disease, stage 3b: Secondary | ICD-10-CM | POA: Diagnosis not present

## 2020-09-21 DIAGNOSIS — I10 Essential (primary) hypertension: Secondary | ICD-10-CM | POA: Diagnosis not present

## 2020-09-21 MED FILL — ONDANSETRON ODT 8 MG TABLET: 8 | 30 days supply | Qty: 90 | Fill #0

## 2020-09-21 MED FILL — BAQSIMI TWO PACK 3 MG/DOSE: 3 | 5 days supply | Qty: 2 | Fill #0

## 2020-09-23 MED FILL — PENTIPS 31G X 5 MM MISC: 31G X 5 MM | 67 days supply | Qty: 200 | Fill #0

## 2020-09-23 MED FILL — ALLOPURINOL 100 MG TABS: 100 | 90 days supply | Qty: 90 | Fill #0

## 2020-10-04 MED FILL — FREESTYLE LIBRE 2 SENSOR MI: 28 days supply | Qty: 2 | Fill #10

## 2020-10-07 MED FILL — BYSTOLIC 10 MG TABLET: 10 | 90 days supply | Qty: 180 | Fill #0

## 2020-11-02 ENCOUNTER — Other Ambulatory Visit: Payer: Self-pay | Admitting: Family Medicine

## 2020-11-02 DIAGNOSIS — N6489 Other specified disorders of breast: Secondary | ICD-10-CM

## 2020-11-02 MED FILL — FREESTYLE LIBRE 2 SENSOR MI: 28 days supply | Qty: 2 | Fill #11

## 2020-11-04 MED FILL — CLOPIDOGREL 75 MG TABLET: 75 | 90 days supply | Qty: 90 | Fill #1

## 2020-11-04 MED FILL — ATORVASTATIN 80 MG TABLET: 80 | 90 days supply | Qty: 90 | Fill #1

## 2020-11-04 MED FILL — NEBIVOLOL HCL 10 MG TABS: 10 | 90 days supply | Qty: 180 | Fill #0

## 2020-11-05 ENCOUNTER — Other Ambulatory Visit (HOSPITAL_BASED_OUTPATIENT_CLINIC_OR_DEPARTMENT_OTHER): Payer: Self-pay | Admitting: Dentistry

## 2020-11-05 MED FILL — CLINDAMYCIN HCL 300 MG CAP: 300 | 14 days supply | Qty: 56 | Fill #0

## 2020-11-05 MED FILL — CHLORHEXIDINE 0.12% RINSE: 0.12 | 6 days supply | Qty: 473 | Fill #0

## 2020-11-08 ENCOUNTER — Other Ambulatory Visit (HOSPITAL_BASED_OUTPATIENT_CLINIC_OR_DEPARTMENT_OTHER): Payer: Self-pay | Admitting: Dentistry

## 2020-11-08 MED FILL — FLUCONAZOLE 150 MG TABS: 150 | 1 days supply | Qty: 1 | Fill #0

## 2020-11-19 MED FILL — CHLORHEXIDINE 0.12% RINSE: 0.12 | 15 days supply | Qty: 473 | Fill #1

## 2020-11-23 DIAGNOSIS — E119 Type 2 diabetes mellitus without complications: Secondary | ICD-10-CM | POA: Diagnosis not present

## 2020-11-23 DIAGNOSIS — H402211 Chronic angle-closure glaucoma, right eye, mild stage: Secondary | ICD-10-CM | POA: Diagnosis not present

## 2020-11-23 DIAGNOSIS — H2513 Age-related nuclear cataract, bilateral: Secondary | ICD-10-CM | POA: Diagnosis not present

## 2020-11-23 DIAGNOSIS — H524 Presbyopia: Secondary | ICD-10-CM | POA: Diagnosis not present

## 2020-11-23 DIAGNOSIS — H25013 Cortical age-related cataract, bilateral: Secondary | ICD-10-CM | POA: Diagnosis not present

## 2020-11-26 ENCOUNTER — Encounter (HOSPITAL_COMMUNITY): Payer: PPO

## 2020-11-26 ENCOUNTER — Ambulatory Visit: Payer: PPO | Admitting: Vascular Surgery

## 2020-11-29 MED FILL — FREESTYLE LIBRE 2 SENSOR MI: 28 days supply | Qty: 2 | Fill #12

## 2020-12-02 DIAGNOSIS — E1169 Type 2 diabetes mellitus with other specified complication: Secondary | ICD-10-CM | POA: Diagnosis not present

## 2020-12-02 DIAGNOSIS — E559 Vitamin D deficiency, unspecified: Secondary | ICD-10-CM | POA: Diagnosis not present

## 2020-12-02 DIAGNOSIS — E785 Hyperlipidemia, unspecified: Secondary | ICD-10-CM | POA: Diagnosis not present

## 2020-12-02 DIAGNOSIS — I152 Hypertension secondary to endocrine disorders: Secondary | ICD-10-CM | POA: Diagnosis not present

## 2020-12-02 DIAGNOSIS — E1159 Type 2 diabetes mellitus with other circulatory complications: Secondary | ICD-10-CM | POA: Diagnosis not present

## 2020-12-02 DIAGNOSIS — E1165 Type 2 diabetes mellitus with hyperglycemia: Secondary | ICD-10-CM | POA: Diagnosis not present

## 2020-12-02 DIAGNOSIS — E1122 Type 2 diabetes mellitus with diabetic chronic kidney disease: Secondary | ICD-10-CM | POA: Diagnosis not present

## 2020-12-02 DIAGNOSIS — Z0000 Encounter for general adult medical examination without abnormal findings: Secondary | ICD-10-CM | POA: Diagnosis not present

## 2020-12-02 DIAGNOSIS — Z794 Long term (current) use of insulin: Secondary | ICD-10-CM | POA: Diagnosis not present

## 2020-12-02 DIAGNOSIS — M109 Gout, unspecified: Secondary | ICD-10-CM | POA: Diagnosis not present

## 2020-12-02 DIAGNOSIS — N183 Chronic kidney disease, stage 3 unspecified: Secondary | ICD-10-CM | POA: Diagnosis not present

## 2020-12-03 ENCOUNTER — Ambulatory Visit
Admission: RE | Admit: 2020-12-03 | Discharge: 2020-12-03 | Disposition: A | Discharge status: Home / Self Care | Payer: PPO | Source: Ambulatory Visit | Attending: Family Medicine | Admitting: Family Medicine

## 2020-12-03 ENCOUNTER — Other Ambulatory Visit: Payer: Self-pay

## 2020-12-03 ENCOUNTER — Ambulatory Visit: Payer: PPO

## 2020-12-03 DIAGNOSIS — R922 Inconclusive mammogram: Secondary | ICD-10-CM | POA: Diagnosis not present

## 2020-12-03 DIAGNOSIS — N6489 Other specified disorders of breast: Secondary | ICD-10-CM | POA: Diagnosis not present

## 2020-12-10 ENCOUNTER — Encounter: Payer: Self-pay | Admitting: Vascular Surgery

## 2020-12-10 ENCOUNTER — Ambulatory Visit: Payer: PPO | Admitting: Vascular Surgery

## 2020-12-10 ENCOUNTER — Other Ambulatory Visit: Payer: Self-pay

## 2020-12-10 ENCOUNTER — Ambulatory Visit (HOSPITAL_COMMUNITY)
Admission: RE | Admit: 2020-12-10 | Discharge: 2020-12-10 | Disposition: A | Discharge status: Home / Self Care | Payer: PPO | Source: Ambulatory Visit | Attending: Vascular Surgery | Admitting: Vascular Surgery

## 2020-12-10 VITALS — BP 158/87 | HR 76 | Temp 97.7°F | Resp 20 | Ht 64.0 in | Wt 271.0 lb

## 2020-12-10 DIAGNOSIS — I6523 Occlusion and stenosis of bilateral carotid arteries: Secondary | ICD-10-CM | POA: Diagnosis not present

## 2020-12-10 NOTE — Progress Notes (Signed)
Patient ID: Kristine Gray, female   DOB: 07/27/1951, 70 y.o.   MRN: [id number]  Reason for Consult: Follow-up   Referred by Bernerd Limbo, MD  Subjective:     HPI:  Kristine Gray is a 70 y.o. female underwent left carotid endarterectomy for symptomatic disease in October 2021.  I had seen the patient in the hospital procedure was performed by Dr. Donnetta Hutching.  At the time she was admitted with stroke and was found to have bilateral carotid stenosis.  She is on aspirin and a statin.  She has had some recent nonfocal issues including bilateral hand numbness over the course of 2 days and one night where she was confused for approximately 24 hours but possibly overdosed on her medicines.  She does not have any new TIAs or strokes.  She was not evaluated medically during either of these times.  She has no residual symptoms from her previous stroke.  Past Medical History:  Diagnosis Date  . Anemia   . Arthritis 04-02-12   arthritis lower back and spurs  . Carotid artery occlusion   . Diabetes mellitus 04-02-12   oral med and insulin used  . Diverticulosis of colon 04-02-12   hx. of, no problems  . H/O nephrostomy 04-02-12   03-19-12 tube placed and remains lt. flank.  . Hypertension   . Kidney calculi 04-02-12   hx. kidney stones-left, past hx. kidney stones  . Obesity    Family History  Problem Relation Age of Onset  . Cancer Other   . Hypertension Other   . Hyperlipidemia Other   . Obesity Other   . Heart attack Other   . Breast cancer Maternal Aunt        in mid 62's   Past Surgical History:  Procedure Laterality Date  . CESAREAN SECTION  04-02-12   '86  . CHOLECYSTECTOMY    . DILATION AND CURETTAGE OF UTERUS  04-02-12  . ENDARTERECTOMY Left 07/16/2020   Procedure: ENDARTERECTOMY CAROTID;  Surgeon: Rosetta Posner, MD;  Location: [facility] OR;  Service: Vascular;  Laterality: Left;  . iriodotomy  04-02-12   bil. eyes  . NEPHROLITHOTOMY  04/09/2012   Procedure: NEPHROLITHOTOMY PERCUTANEOUS;   Surgeon: Fredricka Bonine, MD;  Location: [facility] ORS;  Service: Urology;  Laterality: Left;  . TONSILLECTOMY    . TUBAL LIGATION  04-02-12    '86    Short Social History:  Social History   Tobacco Use  . Smoking status: Never Smoker  . Smokeless tobacco: Never Used  Substance Use Topics  . Alcohol use: No    Allergies  Allergen Reactions  . Penicillins Hives, Itching and Swelling  . Clindamycin/Lincomycin     Other reaction(s): Unknown Trouble breathing  . Dapagliflozin     Other reaction(s): Dizziness  . Valsartan Other (See Comments)    Bradycardia    Current Outpatient Medications  Medication Sig Dispense Refill  . acetaminophen (TYLENOL) 650 MG CR tablet Take 650 mg by mouth every 8 (eight) hours as needed for pain.    Marland Kitchen allopurinol (ZYLOPRIM) 100 MG tablet Take 100 mg by mouth daily.     Marland Kitchen aspirin 325 MG EC tablet Take 1 tablet (325 mg total) by mouth daily with breakfast. (Patient taking differently: Take 81 mg by mouth daily with breakfast. ) 27 tablet 0  . atorvastatin (LIPITOR) 80 MG tablet Take 1 tablet (80 mg total) by mouth daily. 90 tablet 4  . colchicine 0.6 MG tablet Take 1  tablet (0.6 mg total) by mouth daily as needed (gout flare). 30 tablet 0  . gabapentin (NEURONTIN) 300 MG capsule Take 300 mg by mouth daily.    . hydrochlorothiazide (HYDRODIURIL) 25 MG tablet Take 25 mg by mouth daily.    . insulin regular human CONCENTRATED (HUMULIN R) 500 UNIT/ML SOLN injection Inject 40-120 Units into the skin See admin instructions. 120 units when she wakes up ~ 12 noon , 70 units at 6 PM and 40-50 units at bedtime (0200-0400 AM)    . nebivolol (BYSTOLIC) 10 MG tablet Take 10 mg by mouth 2 (two) times daily.    . polycarbophil (FIBERCON) 625 MG tablet Take 1 tablet (625 mg total) by mouth daily. 30 tablet 0  . polyvinyl alcohol (LIQUIFILM TEARS) 1.4 % ophthalmic solution Place 1 drop into both eyes as needed for dry eyes.    . vitamin B-12 (CYANOCOBALAMIN) 1000 MCG  tablet Take 1,000 mcg by mouth daily.    Marland Kitchen VITAMIN D PO Take 1 capsule by mouth daily.    . vitamin E 400 UNIT capsule Take 400 Units by mouth daily.     No current facility-administered medications for this visit.    Review of Systems  Constitutional:  Constitutional negative. HENT: HENT negative.  Eyes: Eyes negative.  Respiratory: Respiratory negative.  Cardiovascular: Cardiovascular negative.  GI: Gastrointestinal negative.  Musculoskeletal: Musculoskeletal negative.  Skin: Skin negative.  Neurological: Positive for numbness.  Psychiatric: Positive for confusion.        Objective:  Objective  Vitals:   12/10/20 1536  BP: (!) 158/87  Pulse: 76  Resp: 20  Temp: 97.7 F (36.5 C)  SpO2: 97%    Physical Exam HENT:     Head: Normocephalic.     Nose:     Comments: Wearing a mask Eyes:     Pupils: Pupils are equal, round, and reactive to light.  Neck:     Vascular: No carotid bruit.  Cardiovascular:     Rate and Rhythm: Normal rate.     Heart sounds: Normal heart sounds.  Pulmonary:     Effort: Pulmonary effort is normal.     Breath sounds: Normal breath sounds.  Abdominal:     General: Abdomen is flat.     Palpations: Abdomen is soft.  Musculoskeletal:        General: No swelling. Normal range of motion.  Skin:    Capillary Refill: Capillary refill takes less than 2 seconds.  Neurological:     General: No focal deficit present.  Psychiatric:        Mood and Affect: Mood normal.        Behavior: Behavior normal.        Thought Content: Thought content normal.        Judgment: Judgment normal.     Data: Right Carotid Findings:  +----------+--------+--------+--------+-----------------------+--------+       PSV cm/sEDV cm/sStenosisPlaque Description   Comments  +----------+--------+--------+--------+-----------------------+--------+  CCA Prox 90   12                          +----------+--------+--------+--------+-----------------------+--------+  CCA Mid  82   13                         +----------+--------+--------+--------+-----------------------+--------+  CCA Distal64   13       calcific              +----------+--------+--------+--------+-----------------------+--------+  ICA  Prox 202   35   1-39%  calcific and hypoechoic      +----------+--------+--------+--------+-----------------------+--------+  ICA Mid  125   22                         +----------+--------+--------+--------+-----------------------+--------+  ICA Distal80   21                         +----------+--------+--------+--------+-----------------------+--------+  ECA    116   11       calcific              +----------+--------+--------+--------+-----------------------+--------+   +----------+--------+-------+----------------+-------------------+       PSV cm/sEDV cmsDescribe    Arm Pressure (mmHG)  +----------+--------+-------+----------------+-------------------+  Subclavian102       Multiphasic, WNL            +----------+--------+-------+----------------+-------------------+   +---------+--------+--+--------+-+---------+  VertebralPSV cm/s54EDV cm/s8Antegrade  +---------+--------+--+--------+-+---------+      Left Carotid Findings:  +----------+--------+--------+--------+------------------+--------+       PSV cm/sEDV cm/sStenosisPlaque DescriptionComments  +----------+--------+--------+--------+------------------+--------+  CCA Prox 96   22                      +----------+--------+--------+--------+------------------+--------+  CCA Mid  83   18                       +----------+--------+--------+--------+------------------+--------+  CCA Distal68   16                      +----------+--------+--------+--------+------------------+--------+  ICA Prox 64   13   1-39%                 +----------+--------+--------+--------+------------------+--------+  ICA Mid  102   26                      +----------+--------+--------+--------+------------------+--------+  ICA Distal76   21                      +----------+--------+--------+--------+------------------+--------+  ECA    127   24                      +----------+--------+--------+--------+------------------+--------+   +----------+--------+--------+----------------+-------------------+       PSV cm/sEDV cm/sDescribe    Arm Pressure (mmHG)  +----------+--------+--------+----------------+-------------------+  [id number]       Multiphasic, WNL            +----------+--------+--------+----------------+-------------------+   +---------+--------+--+--------+--+---------+  VertebralPSV cm/s57EDV cm/s11Antegrade  +---------+--------+--+--------+--+---------+         Summary:  Right Carotid: Velocities in the right ICA are consistent with a 1-39%  stenosis.   Left Carotid: Velocities in the left ICA are consistent with a 1-39%  stenosis.   Vertebrals: Bilateral vertebral arteries demonstrate antegrade flow.  Subclavians: Normal flow hemodynamics were seen in bilateral subclavian        arteries.      Assessment/Plan:     70 year old female underwent left carotid endarterectomy for symptomatic disease while she was an inpatient.  CTA at that time demonstrated high-grade stenosis on the right as well this was not repeated by duplex today.  She will remain on aspirin statin and can follow-up in 1 year  with repeat carotid duplex.  Should she have further symptoms which we discussed the signs and symptoms of stroke and her husband being a former nurse there is very well and she will seek emergent  medical attention.     Kristine Sandy MD [facility]

## 2020-12-13 ENCOUNTER — Ambulatory Visit: Payer: PPO | Admitting: Adult Health

## 2020-12-13 ENCOUNTER — Encounter: Payer: Self-pay | Admitting: Adult Health

## 2020-12-13 VITALS — BP 155/84 | HR 77 | Ht 64.0 in | Wt 266.0 lb

## 2020-12-13 DIAGNOSIS — I6523 Occlusion and stenosis of bilateral carotid arteries: Secondary | ICD-10-CM | POA: Diagnosis not present

## 2020-12-13 DIAGNOSIS — G459 Transient cerebral ischemic attack, unspecified: Secondary | ICD-10-CM | POA: Diagnosis not present

## 2020-12-13 NOTE — Patient Instructions (Addendum)
Continue aspirin 81 mg daily and clopidogrel 75 mg daily  and atorvastatin 80 mg daily for secondary stroke prevention  Ensure follow-up with Dr. Donzetta Matters regarding use of both aspirin and Plavix and plans on repeating carotid ultrasound in 1 year as he recommended  Continue to follow up with PCP regarding cholesterol, blood pressure and diabetes management  Maintain strict control of hypertension with blood pressure goal between 130-150, diabetes with hemoglobin A1c goal below 7% and cholesterol with LDL cholesterol (bad cholesterol) goal below 70 mg/dL.       Followup in the future with me in 6 months or call earlier if needed       Thank you for coming to see [place] at [facility]. I hope we have been able to provide you high quality care today.  You may receive a patient satisfaction survey over the next few weeks. We would appreciate your feedback and comments so that we may continue to improve ourselves and the health of our patients.

## 2020-12-13 NOTE — Progress Notes (Signed)
[facility] [address] [place]. [place] [address] [phone]       OFFICE FOLLOW UP NOTE  [name] Date of Birth:  Feb 27, 1951 Medical Record Number:  [id number]   Reason for Referral: TIA follow up    SUBJECTIVE:   CHIEF COMPLAINT:  Chief Complaint  Patient presents with  . Follow-up    RM 14 alone Pt is well, just has some imbalance     HPI:   Today, 12/13/2020, [name] returns for TIA follow-up unaccompanied.  Doing well since prior visit without new or reoccurring stroke/TIA symptoms Chronic imbalance stable -continue to use a cane without any recent falls  Continues on aspirin and Plavix -denies side effects Continues on atorvastatin -denies side effects Blood pressure today 155/84 -does not routinely monitor at home. Per pt, nephrology recommended SBP goal >140s Glucose levels stable managed by endocrinology  Seen by Dr. Donzetta Matters [facility] 3/18 with CUS 1-39% b/l ICA stenosis - plans on repeat duplex 1 yr and per patient, was told to continue DAPT as well as statin Reports recent follow-up with ophthalmology - plans on undergoing cataract surgery this summer  No new concerns at this time     History provided for reference purposes only Initial visit 08/17/2020 JM: [name] is being seen for hospital follow-up unaccompanied.  She initially was doing well after discharge but had episode of vertigo resulting in a fall on 11/18 at 2 AM.  Upon walking to the bathroom, she reports dizziness type sensation and then had difficulty standing upright, was able to sit on the toilet but felt as though she was getting pulled to her left side and ended up falling.  She does have a large area of bruising on her left side but no other injury.  She will experience occasional dizziness upon standing which resolves quickly.  She has not experienced any additional vertigo episodes.  She does report sensation of dizziness/vertigo prior to her CEA but  resolved without any recurrence until 11/18.  She did not check her blood pressure during that time but did check BG which was normal.  Denies any other stroke/TIA symptoms.  She remains on aspirin 81 mg daily and Plavix with bruising post fall but denies bleeding.  Plans on follow-up with Dr. Donzetta Matters in 11/2020 to reevaluate carotid stenosis.  Also remains on atorvastatin 80 mg daily without myalgias.  Blood pressure today 147/77 monitors at home with SBP typically 140s.  Glucose levels have been stable currently managed by endocrinology Dr. Forde Dandy.  No further concerns at this time.  TIA admission 07/14/2020 [name] is a 70 y.o. female with history of HTN, DM, renal calculi, diverticulosis, arthritis and amenia who presented to [facility] 07/14/20 with transient acute R hand sensory numbness which progressed to acute onset aphasia - slurred speech and difficulty getting her words out which lasted 5 to 10 minutes.  Personally reviewed hospitalization pertinent progress notes, lab work and imaging with summary provided.  She was transferred to [facility] evaluated by Dr. Erlinda Hong with MRI negative for acute abnormality and likely L brain TIA secondary to symptomatic L ICA stenosis with CTA showing bilateral ICA 70% stenosis s/p CEA 02/40 without complication.  Recommended DAPT for 3 weeks then Plavix alone as on aspirin PTA.  HTN stable and recommend long-term BP goal 130-150 given right ICA stenosis.  LDL 123 and increased home dose atorvastatin 20 mg to 80 mg daily.  Uncontrolled DM with A1c 9.0.  Other stroke risk factors include advanced age and morbid obesity but no prior stroke history.  She was discharged home in stable condition without therapy needs.   L brainTIAd/t symptomatic L ICA stenosis  Code Stroke CT head No acute abnormality. Moderate Small vessel disease.   CTA head & neck no ELVO. B ICA bulky plaque 70%.Moderate short proximal R P2 stenosis. Fetal L PCA.  MRI  No  acuteabnormality. ModerateSmall vessel disease.   2D Echo EF 70-75%. No source of embolus   LDL 123  HgbA1c 9.0  VTE prophylaxis - SCDs   aspirin 162mg  daily prior to admission, now on aspirin 325 mg daily and clopidogrel 75 mg daily following plavix load. Continue DAPT for 3 weeks and the plavix alone if OK with [facility].   Therapy recommendations:  No therapy needs  Disposition:  Return home       ROS:   14 system review of systems performed and negative with exception of imbalance  PMH:  Past Medical History:  Diagnosis Date  . Anemia   . Arthritis 04-02-12   arthritis lower back and spurs  . Carotid artery occlusion   . Diabetes mellitus 04-02-12   oral med and insulin used  . Diverticulosis of colon 04-02-12   hx. of, no problems  . H/O nephrostomy 04-02-12   03-19-12 tube placed and remains lt. flank.  . Hypertension   . Kidney calculi 04-02-12   hx. kidney stones-left, past hx. kidney stones  . Obesity     PSH:  Past Surgical History:  Procedure Laterality Date  . CESAREAN SECTION  04-02-12   '86  . CHOLECYSTECTOMY    . DILATION AND CURETTAGE OF UTERUS  04-02-12  . ENDARTERECTOMY Left 07/16/2020   Procedure: ENDARTERECTOMY CAROTID;  Surgeon: Rosetta Posner, MD;  Location: [facility] OR;  Service: Vascular;  Laterality: Left;  . iriodotomy  04-02-12   bil. eyes  . NEPHROLITHOTOMY  04/09/2012   Procedure: NEPHROLITHOTOMY PERCUTANEOUS;  Surgeon: Fredricka Bonine, MD;  Location: [facility] ORS;  Service: Urology;  Laterality: Left;  . TONSILLECTOMY    . TUBAL LIGATION  04-02-12    '86    Social History:  Social History   Socioeconomic History  . Marital status: Married    Spouse name: Not on file  . Number of children: Not on file  . Years of education: Not on file  . Highest education level: Not on file  Occupational History  . Not on file  Tobacco Use  . Smoking status: Never Smoker  . Smokeless tobacco: Never Used  Vaping Use  . Vaping Use: Never used  Substance and  Sexual Activity  . Alcohol use: No  . Drug use: No  . Sexual activity: Yes  Other Topics Concern  . Not on file  Social History Narrative  . Not on file   Social Determinants of Health   Financial Resource Strain: Not on file  Food Insecurity: Not on file  Transportation Needs: Not on file  Physical Activity: Not on file  Stress: Not on file  Social Connections: Not on file  Intimate Partner Violence: Not on file    Family History:  Family History  Problem Relation Age of Onset  . Cancer Other   . Hypertension Other   . Hyperlipidemia Other   . Obesity Other   . Heart attack Other   . Breast cancer Maternal Aunt        in mid 60's    Medications:  Current Outpatient Medications on File Prior to Visit  Medication Sig Dispense Refill  . acetaminophen (TYLENOL) 650 MG CR tablet Take 650 mg by mouth every 8 (eight) hours as needed for pain.    Marland Kitchen allopurinol (ZYLOPRIM) 100 MG tablet Take 100 mg by mouth daily.     Marland Kitchen aspirin EC 81 MG tablet Take 81 mg by mouth daily. Swallow whole.    Marland Kitchen atorvastatin (LIPITOR) 80 MG tablet Take 1 tablet (80 mg total) by mouth daily. 90 tablet 4  . clopidogrel (PLAVIX) 75 MG tablet Take 75 mg by mouth daily.    . colchicine 0.6 MG tablet Take 1 tablet (0.6 mg total) by mouth daily as needed (gout flare). 30 tablet 0  . gabapentin (NEURONTIN) 300 MG capsule Take 300 mg by mouth daily.    . hydrochlorothiazide (HYDRODIURIL) 25 MG tablet Take 25 mg by mouth daily.    . insulin regular human CONCENTRATED (HUMULIN R) 500 UNIT/ML injection Inject 40-120 Units into the skin See admin instructions. 120 units when she wakes up ~ 12 noon , 70 units at 6 PM and 40-50 units at bedtime (0200-0400 AM)    . nebivolol (BYSTOLIC) 10 MG tablet Take 10 mg by mouth 2 (two) times daily.    . polycarbophil (FIBERCON) 625 MG tablet Take 1 tablet (625 mg total) by mouth daily. 30 tablet 0  . polyvinyl alcohol (LIQUIFILM TEARS) 1.4 % ophthalmic solution Place 1 drop  into both eyes as needed for dry eyes.    . vitamin B-12 (CYANOCOBALAMIN) 1000 MCG tablet Take 1,000 mcg by mouth daily.    Marland Kitchen VITAMIN D PO Take 1 capsule by mouth daily.    . vitamin E 400 UNIT capsule Take 400 Units by mouth daily.     No current facility-administered medications on file prior to visit.    Allergies:   Allergies  Allergen Reactions  . Penicillins Hives, Itching and Swelling  . Clindamycin/Lincomycin     Other reaction(s): Unknown Trouble breathing  . Dapagliflozin     Other reaction(s): Dizziness  . Valsartan Other (See Comments)    Bradycardia      OBJECTIVE:  Physical Exam  Vitals:   12/13/20 1430  BP: (!) 155/84  Pulse: 77  Weight: 266 lb (120.7 kg)  Height: 5\' 4"  (1.626 m)   Body mass index is 45.66 kg/m. No exam data present  General: Morbidly obese very pleasant elderly Caucasian female, seated, in no evident distress Head: head normocephalic and atraumatic.   Neck: supple with no carotid or supraclavicular bruits Cardiovascular: regular rate and rhythm, no murmurs Musculoskeletal: no deformity Skin:  no rash/petichiae Vascular:  Normal pulses all extremities   Neurologic Exam Mental Status: Awake and fully alert.  Fluent speech and language.  Oriented to place and time. Recent and remote memory intact. Attention span, concentration and fund of knowledge appropriate. Mood and affect appropriate.  Cranial Nerves: Pupils equal, briskly reactive to light. Extraocular movements full without nystagmus. Visual fields full to confrontation. Hearing intact. Facial sensation intact. Face, tongue, palate moves normally and symmetrically.  Motor: Normal bulk and tone. Normal strength in all tested extremity muscles. Sensory.: intact to touch , pinprick , position and vibratory sensation.  Coordination: Rapid alternating movements normal in all extremities. Finger-to-nose and heel-to-shin performed accurately bilaterally. Gait and Station: Arises from  chair without difficulty. Stance is normal. Gait demonstrates normal stride length and mild imbalance with use of cane Reflexes: 1+ and symmetric. Toes downgoing.  ASSESSMENT: Kristine Gray is a 70 y.o. year old female presented with transient acute right hand sensory numbness which progressed to aphasia and slurred speech lasting approximately 5 to 10 minutes on 07/14/2020 likely left brain TIA secondary to symptomatic left ICA stenosis s/p CEA. Vascular risk factors include carotid stenosis, HTN, HLD, uncontrolled DM, advanced age and morbid obesity.      PLAN:  1. TIA :  a. Denies new stroke/TIA symptoms b. Continue aspirin 81 mg daily and clopidogrel 75 mg daily  and atorvastatin for secondary stroke prevention and s/p CEA.  Advised to contact Dr. Donzetta Matters regarding continued use of DAPT - per his note review, mentioned continued use of aspirin but unable to verify continued use of Plavix. From a stroke standpoint, when able, would recommend monotherapy with Plavix as she was on aspirin routinely prior to TIA - advised possible need of ongoing DAPT based on [facility] recommendations c. Discussed secondary stroke prevention measures and importance of close PCP follow up for aggressive stroke risk factor management  2. Carotid stenosis: s/p L CEA; R ICA 70% stenosis; recent duplex b/l ICA 1 to 39% stenosis.  Plans on repeat CUS around 11/2021 per [facility]VS recommendations 3. HTN: BP goal 130-150 d/t carotid stenosis.  Stable on hydrochlorothiazide and Bystolic per PCP 4. HLD: LDL goal <70. Recent LDL 86 down from 123 on atorvastatin 80 mg daily per PCP 5. DMII: A1c goal<7.0. Recent A1c 8.7 (down from 9.0). On Humulin per PCP    Follow up in 6 months or call earlier if needed   CC:  [place] provider:  Dr. Orlena Sheldon, MD  (PCP) Waynetta Sandy, MD ([facility])  I spent 30 minutes of face-to-face and non-face-to-face time with patient.  This included previsit chart review, lab  review, study review, order entry, electronic health record documentation, patient education regarding TIA, carotid stenosis, importance of managing stroke risk factors and answered all other questions to patient satisfaction  Frann Rider, [facility]  [facility] [address] [place], [place] [address]  Phone [phone] Fax [phone] Note: This document was prepared with digital dictation and possible smart phrase technology. Any transcriptional errors that result from this process are unintentional.

## 2020-12-14 NOTE — Progress Notes (Signed)
I agree with the above plan 

## 2020-12-16 DIAGNOSIS — I6523 Occlusion and stenosis of bilateral carotid arteries: Secondary | ICD-10-CM | POA: Diagnosis not present

## 2020-12-16 DIAGNOSIS — I700 Atherosclerosis of aorta: Secondary | ICD-10-CM | POA: Diagnosis not present

## 2020-12-16 DIAGNOSIS — M109 Gout, unspecified: Secondary | ICD-10-CM | POA: Diagnosis not present

## 2020-12-16 DIAGNOSIS — Z794 Long term (current) use of insulin: Secondary | ICD-10-CM | POA: Diagnosis not present

## 2020-12-16 DIAGNOSIS — I129 Hypertensive chronic kidney disease with stage 1 through stage 4 chronic kidney disease, or unspecified chronic kidney disease: Secondary | ICD-10-CM | POA: Diagnosis not present

## 2020-12-16 DIAGNOSIS — N1832 Chronic kidney disease, stage 3b: Secondary | ICD-10-CM | POA: Diagnosis not present

## 2020-12-16 DIAGNOSIS — E049 Nontoxic goiter, unspecified: Secondary | ICD-10-CM | POA: Diagnosis not present

## 2020-12-16 DIAGNOSIS — E1129 Type 2 diabetes mellitus with other diabetic kidney complication: Secondary | ICD-10-CM | POA: Diagnosis not present

## 2020-12-16 DIAGNOSIS — I10 Essential (primary) hypertension: Secondary | ICD-10-CM | POA: Diagnosis not present

## 2020-12-16 DIAGNOSIS — E785 Hyperlipidemia, unspecified: Secondary | ICD-10-CM | POA: Diagnosis not present

## 2020-12-16 DIAGNOSIS — I5189 Other ill-defined heart diseases: Secondary | ICD-10-CM | POA: Diagnosis not present

## 2020-12-16 MED FILL — HUMULIN R 500 UNITS/ML KWIK: 500 | 88 days supply | Qty: 36 | Fill #1

## 2020-12-29 MED FILL — Allopurinol Tab 100 MG: ORAL | 90 days supply | Qty: 90 | Fill #0 | Status: AC

## 2020-12-30 ENCOUNTER — Other Ambulatory Visit (HOSPITAL_BASED_OUTPATIENT_CLINIC_OR_DEPARTMENT_OTHER): Payer: Self-pay

## 2020-12-31 ENCOUNTER — Other Ambulatory Visit (HOSPITAL_BASED_OUTPATIENT_CLINIC_OR_DEPARTMENT_OTHER): Payer: Self-pay

## 2020-12-31 ENCOUNTER — Other Ambulatory Visit (HOSPITAL_BASED_OUTPATIENT_CLINIC_OR_DEPARTMENT_OTHER): Payer: Self-pay | Admitting: Endocrinology

## 2020-12-31 MED FILL — Cyclobenzaprine HCl Tab 10 MG: ORAL | 90 days supply | Qty: 90 | Fill #0 | Status: AC

## 2020-12-31 MED FILL — Ondansetron Orally Disintegrating Tab 8 MG: ORAL | 30 days supply | Qty: 10 | Fill #0 | Status: AC

## 2020-12-31 MED FILL — Insulin Pen Needle 31 G X 5 MM (1/5" or 3/16"): 67 days supply | Qty: 200 | Fill #0 | Status: AC

## 2020-12-31 MED FILL — Gabapentin Cap 300 MG: ORAL | 90 days supply | Qty: 90 | Fill #0 | Status: AC

## 2021-01-03 ENCOUNTER — Other Ambulatory Visit: Payer: Self-pay | Admitting: Pharmacotherapy

## 2021-01-03 ENCOUNTER — Other Ambulatory Visit (HOSPITAL_BASED_OUTPATIENT_CLINIC_OR_DEPARTMENT_OTHER): Payer: Self-pay

## 2021-01-04 ENCOUNTER — Other Ambulatory Visit (HOSPITAL_BASED_OUTPATIENT_CLINIC_OR_DEPARTMENT_OTHER): Payer: Self-pay

## 2021-01-04 MED ORDER — FREESTYLE LIBRE 2 SENSOR MISC
3 refills | Status: DC
  Filled 2021-01-04: qty 6, 90d supply, fill #0
  Filled 2021-03-03: qty 6, 84d supply, fill #1
  Filled 2021-03-14: qty 6, 90d supply, fill #1
  Filled 2021-06-08: qty 6, 90d supply, fill #2
  Filled 2021-08-10 – 2021-08-29 (×2): qty 6, 90d supply, fill #3
  Filled 2021-11-15: qty 6, 90d supply, fill #4

## 2021-01-05 ENCOUNTER — Other Ambulatory Visit (HOSPITAL_BASED_OUTPATIENT_CLINIC_OR_DEPARTMENT_OTHER): Payer: Self-pay

## 2021-01-19 ENCOUNTER — Other Ambulatory Visit (HOSPITAL_BASED_OUTPATIENT_CLINIC_OR_DEPARTMENT_OTHER): Payer: Self-pay

## 2021-01-26 ENCOUNTER — Other Ambulatory Visit (HOSPITAL_BASED_OUTPATIENT_CLINIC_OR_DEPARTMENT_OTHER): Payer: Self-pay

## 2021-01-26 DIAGNOSIS — I129 Hypertensive chronic kidney disease with stage 1 through stage 4 chronic kidney disease, or unspecified chronic kidney disease: Secondary | ICD-10-CM | POA: Diagnosis not present

## 2021-01-26 DIAGNOSIS — E785 Hyperlipidemia, unspecified: Secondary | ICD-10-CM | POA: Diagnosis not present

## 2021-01-26 DIAGNOSIS — Z794 Long term (current) use of insulin: Secondary | ICD-10-CM | POA: Diagnosis not present

## 2021-01-26 DIAGNOSIS — I10 Essential (primary) hypertension: Secondary | ICD-10-CM | POA: Diagnosis not present

## 2021-01-26 DIAGNOSIS — E1129 Type 2 diabetes mellitus with other diabetic kidney complication: Secondary | ICD-10-CM | POA: Diagnosis not present

## 2021-01-26 DIAGNOSIS — N1832 Chronic kidney disease, stage 3b: Secondary | ICD-10-CM | POA: Diagnosis not present

## 2021-01-26 MED ORDER — COLCHICINE 0.6 MG PO TABS
ORAL_TABLET | ORAL | 3 refills | Status: DC
  Filled 2021-01-26: qty 90, 90d supply, fill #0
  Filled 2021-05-06: qty 90, 90d supply, fill #1
  Filled 2021-08-10: qty 90, 90d supply, fill #2

## 2021-01-28 MED FILL — Nebivolol HCl Tab 10 MG (Base Equivalent): ORAL | 90 days supply | Qty: 180 | Fill #0 | Status: CN

## 2021-01-28 MED FILL — Clopidogrel Bisulfate Tab 75 MG (Base Equiv): ORAL | 90 days supply | Qty: 90 | Fill #0 | Status: AC

## 2021-01-28 MED FILL — Atorvastatin Calcium Tab 80 MG (Base Equivalent): ORAL | 90 days supply | Qty: 90 | Fill #0 | Status: AC

## 2021-01-31 ENCOUNTER — Other Ambulatory Visit (HOSPITAL_BASED_OUTPATIENT_CLINIC_OR_DEPARTMENT_OTHER): Payer: Self-pay

## 2021-01-31 MED ORDER — NEBIVOLOL HCL 10 MG PO TABS
ORAL_TABLET | ORAL | 0 refills | Status: DC
  Filled 2021-01-31: qty 180, 90d supply, fill #0

## 2021-01-31 MED FILL — Nebivolol HCl Tab 10 MG (Base Equivalent): ORAL | 30 days supply | Qty: 60 | Fill #0 | Status: AC

## 2021-02-04 ENCOUNTER — Other Ambulatory Visit (HOSPITAL_BASED_OUTPATIENT_CLINIC_OR_DEPARTMENT_OTHER): Payer: Self-pay

## 2021-02-04 MED ORDER — CHLORHEXIDINE GLUCONATE 0.12 % MT SOLN
OROMUCOSAL | 2 refills | Status: DC
  Filled 2021-02-04: qty 473, 14d supply, fill #0

## 2021-02-04 MED ORDER — CEPHALEXIN 500 MG PO CAPS
ORAL_CAPSULE | ORAL | 0 refills | Status: DC
  Filled 2021-02-04: qty 40, 10d supply, fill #0

## 2021-02-04 MED ORDER — FLUCONAZOLE 150 MG PO TABS
ORAL_TABLET | ORAL | 2 refills | Status: DC
  Filled 2021-02-04: qty 1, 1d supply, fill #0

## 2021-02-04 MED ORDER — CLINDAMYCIN HCL 300 MG PO CAPS
ORAL_CAPSULE | ORAL | 0 refills | Status: DC
  Filled 2021-02-04: qty 56, 14d supply, fill #0

## 2021-02-14 ENCOUNTER — Other Ambulatory Visit (HOSPITAL_BASED_OUTPATIENT_CLINIC_OR_DEPARTMENT_OTHER): Payer: Self-pay

## 2021-02-14 MED FILL — Insulin Regular (Human) Soln Pen-Injector 500 Unit/ML: SUBCUTANEOUS | 88 days supply | Qty: 36 | Fill #0 | Status: CN

## 2021-02-28 DIAGNOSIS — H2513 Age-related nuclear cataract, bilateral: Secondary | ICD-10-CM | POA: Diagnosis not present

## 2021-02-28 DIAGNOSIS — H2589 Other age-related cataract: Secondary | ICD-10-CM | POA: Diagnosis not present

## 2021-02-28 DIAGNOSIS — H2511 Age-related nuclear cataract, right eye: Secondary | ICD-10-CM | POA: Diagnosis not present

## 2021-02-28 DIAGNOSIS — H25013 Cortical age-related cataract, bilateral: Secondary | ICD-10-CM | POA: Diagnosis not present

## 2021-02-28 DIAGNOSIS — H402231 Chronic angle-closure glaucoma, bilateral, mild stage: Secondary | ICD-10-CM | POA: Diagnosis not present

## 2021-03-01 ENCOUNTER — Other Ambulatory Visit (HOSPITAL_BASED_OUTPATIENT_CLINIC_OR_DEPARTMENT_OTHER): Payer: Self-pay

## 2021-03-01 MED ORDER — HUMULIN R U-500 KWIKPEN 500 UNIT/ML ~~LOC~~ SOPN
PEN_INJECTOR | SUBCUTANEOUS | 1 refills | Status: DC
  Filled 2021-03-01: qty 36, 88d supply, fill #0

## 2021-03-01 MED FILL — Insulin Regular (Human) Soln Pen-Injector 500 Unit/ML: SUBCUTANEOUS | 30 days supply | Qty: 12 | Fill #0 | Status: AC

## 2021-03-01 MED FILL — Insulin Regular (Human) Soln Pen-Injector 500 Unit/ML: SUBCUTANEOUS | Qty: 39 | Fill #0 | Status: CN

## 2021-03-01 MED FILL — Insulin Regular (Human) Soln Pen-Injector 500 Unit/ML: SUBCUTANEOUS | 30 days supply | Qty: 12 | Fill #0 | Status: CN

## 2021-03-02 ENCOUNTER — Other Ambulatory Visit (HOSPITAL_BASED_OUTPATIENT_CLINIC_OR_DEPARTMENT_OTHER): Payer: Self-pay

## 2021-03-02 MED ORDER — ALLOPURINOL 100 MG PO TABS
100.0000 mg | ORAL_TABLET | Freq: Every day | ORAL | 1 refills | Status: DC
  Filled 2021-03-02 – 2021-08-10 (×2): qty 90, 90d supply, fill #0
  Filled 2021-11-15: qty 90, 90d supply, fill #1

## 2021-03-02 MED FILL — Insulin Pen Needle 31 G X 5 MM (1/5" or 3/16"): 67 days supply | Qty: 200 | Fill #1 | Status: AC

## 2021-03-02 MED FILL — Nebivolol HCl Tab 10 MG (Base Equivalent): ORAL | 30 days supply | Qty: 60 | Fill #1 | Status: AC

## 2021-03-03 ENCOUNTER — Other Ambulatory Visit (HOSPITAL_BASED_OUTPATIENT_CLINIC_OR_DEPARTMENT_OTHER): Payer: Self-pay

## 2021-03-09 ENCOUNTER — Other Ambulatory Visit (HOSPITAL_BASED_OUTPATIENT_CLINIC_OR_DEPARTMENT_OTHER): Payer: Self-pay

## 2021-03-14 ENCOUNTER — Other Ambulatory Visit (HOSPITAL_BASED_OUTPATIENT_CLINIC_OR_DEPARTMENT_OTHER): Payer: Self-pay

## 2021-03-15 ENCOUNTER — Other Ambulatory Visit (HOSPITAL_BASED_OUTPATIENT_CLINIC_OR_DEPARTMENT_OTHER): Payer: Self-pay

## 2021-03-16 ENCOUNTER — Other Ambulatory Visit (HOSPITAL_BASED_OUTPATIENT_CLINIC_OR_DEPARTMENT_OTHER): Payer: Self-pay

## 2021-03-22 ENCOUNTER — Other Ambulatory Visit (HOSPITAL_BASED_OUTPATIENT_CLINIC_OR_DEPARTMENT_OTHER): Payer: Self-pay

## 2021-03-22 MED ORDER — HUMULIN R U-500 KWIKPEN 500 UNIT/ML ~~LOC~~ SOPN: PEN_INJECTOR | SUBCUTANEOUS | 3 refills | Status: DC

## 2021-03-22 MED ORDER — HUMULIN R U-500 KWIKPEN 500 UNIT/ML ~~LOC~~ SOPN
PEN_INJECTOR | SUBCUTANEOUS | 3 refills | Status: DC
  Filled 2021-03-22: qty 42, 76d supply, fill #0
  Filled 2021-03-22: qty 45, 82d supply, fill #0
  Filled 2021-06-08: qty 42, 76d supply, fill #1
  Filled 2021-08-29: qty 42, 76d supply, fill #2
  Filled 2021-11-15: qty 42, 76d supply, fill #3
  Filled 2022-02-01: qty 42, 76d supply, fill #4

## 2021-03-22 MED FILL — Insulin Regular (Human) Soln Pen-Injector 500 Unit/ML: SUBCUTANEOUS | 30 days supply | Qty: 12 | Fill #1 | Status: CN

## 2021-03-23 ENCOUNTER — Other Ambulatory Visit (HOSPITAL_BASED_OUTPATIENT_CLINIC_OR_DEPARTMENT_OTHER): Payer: Self-pay

## 2021-03-24 ENCOUNTER — Other Ambulatory Visit (HOSPITAL_BASED_OUTPATIENT_CLINIC_OR_DEPARTMENT_OTHER): Payer: Self-pay

## 2021-04-17 MED FILL — Nebivolol HCl Tab 10 MG (Base Equivalent): ORAL | 30 days supply | Qty: 60 | Fill #2 | Status: AC

## 2021-04-18 ENCOUNTER — Other Ambulatory Visit (HOSPITAL_BASED_OUTPATIENT_CLINIC_OR_DEPARTMENT_OTHER): Payer: Self-pay

## 2021-04-25 HISTORY — PX: CATARACT EXTRACTION, BILATERAL: SHX1313

## 2021-05-04 DIAGNOSIS — H2511 Age-related nuclear cataract, right eye: Secondary | ICD-10-CM | POA: Diagnosis not present

## 2021-05-04 DIAGNOSIS — H25811 Combined forms of age-related cataract, right eye: Secondary | ICD-10-CM | POA: Diagnosis not present

## 2021-05-05 DIAGNOSIS — I700 Atherosclerosis of aorta: Secondary | ICD-10-CM | POA: Diagnosis not present

## 2021-05-05 DIAGNOSIS — E1129 Type 2 diabetes mellitus with other diabetic kidney complication: Secondary | ICD-10-CM | POA: Diagnosis not present

## 2021-05-05 DIAGNOSIS — Z794 Long term (current) use of insulin: Secondary | ICD-10-CM | POA: Diagnosis not present

## 2021-05-05 DIAGNOSIS — N1832 Chronic kidney disease, stage 3b: Secondary | ICD-10-CM | POA: Diagnosis not present

## 2021-05-05 DIAGNOSIS — I129 Hypertensive chronic kidney disease with stage 1 through stage 4 chronic kidney disease, or unspecified chronic kidney disease: Secondary | ICD-10-CM | POA: Diagnosis not present

## 2021-05-06 ENCOUNTER — Other Ambulatory Visit (HOSPITAL_BASED_OUTPATIENT_CLINIC_OR_DEPARTMENT_OTHER): Payer: Self-pay

## 2021-05-06 MED FILL — Allopurinol Tab 100 MG: ORAL | 90 days supply | Qty: 90 | Fill #1 | Status: AC

## 2021-05-06 MED FILL — Cyclobenzaprine HCl Tab 10 MG: ORAL | 90 days supply | Qty: 90 | Fill #1 | Status: AC

## 2021-05-06 MED FILL — Gabapentin Cap 300 MG: ORAL | 90 days supply | Qty: 90 | Fill #1 | Status: AC

## 2021-05-06 MED FILL — Atorvastatin Calcium Tab 80 MG (Base Equivalent): ORAL | 90 days supply | Qty: 90 | Fill #1 | Status: AC

## 2021-05-06 MED FILL — Clopidogrel Bisulfate Tab 75 MG (Base Equiv): ORAL | 90 days supply | Qty: 90 | Fill #1 | Status: AC

## 2021-05-09 ENCOUNTER — Other Ambulatory Visit (HOSPITAL_BASED_OUTPATIENT_CLINIC_OR_DEPARTMENT_OTHER): Payer: Self-pay

## 2021-05-09 MED ORDER — NEBIVOLOL HCL 10 MG PO TABS
ORAL_TABLET | ORAL | 3 refills | Status: DC
  Filled 2021-05-09: qty 180, 90d supply, fill #0
  Filled 2021-05-10: qty 270, 90d supply, fill #0
  Filled 2021-05-10: qty 180, 90d supply, fill #0
  Filled 2021-08-10: qty 180, 90d supply, fill #1
  Filled 2021-11-15: qty 180, 90d supply, fill #2
  Filled 2022-02-01: qty 180, 90d supply, fill #3
  Filled 2022-05-07: qty 180, 90d supply, fill #4

## 2021-05-09 MED ORDER — HYDROCHLOROTHIAZIDE 25 MG PO TABS
ORAL_TABLET | ORAL | 3 refills | Status: DC
  Filled 2021-05-09: qty 90, 90d supply, fill #0
  Filled 2021-08-10: qty 90, 90d supply, fill #1
  Filled 2021-11-15: qty 90, 90d supply, fill #2

## 2021-05-10 ENCOUNTER — Other Ambulatory Visit (HOSPITAL_BASED_OUTPATIENT_CLINIC_OR_DEPARTMENT_OTHER): Payer: Self-pay

## 2021-05-10 DIAGNOSIS — H2512 Age-related nuclear cataract, left eye: Secondary | ICD-10-CM | POA: Diagnosis not present

## 2021-05-10 DIAGNOSIS — H25012 Cortical age-related cataract, left eye: Secondary | ICD-10-CM | POA: Diagnosis not present

## 2021-05-11 ENCOUNTER — Other Ambulatory Visit (HOSPITAL_BASED_OUTPATIENT_CLINIC_OR_DEPARTMENT_OTHER): Payer: Self-pay

## 2021-06-01 DIAGNOSIS — H2512 Age-related nuclear cataract, left eye: Secondary | ICD-10-CM | POA: Diagnosis not present

## 2021-06-01 DIAGNOSIS — H2589 Other age-related cataract: Secondary | ICD-10-CM | POA: Diagnosis not present

## 2021-06-01 DIAGNOSIS — H25012 Cortical age-related cataract, left eye: Secondary | ICD-10-CM | POA: Diagnosis not present

## 2021-06-01 DIAGNOSIS — H25812 Combined forms of age-related cataract, left eye: Secondary | ICD-10-CM | POA: Diagnosis not present

## 2021-06-08 MED FILL — Insulin Pen Needle 31 G X 5 MM (1/5" or 3/16"): 67 days supply | Qty: 200 | Fill #2 | Status: AC

## 2021-06-09 ENCOUNTER — Other Ambulatory Visit (HOSPITAL_BASED_OUTPATIENT_CLINIC_OR_DEPARTMENT_OTHER): Payer: Self-pay

## 2021-06-09 DIAGNOSIS — I152 Hypertension secondary to endocrine disorders: Secondary | ICD-10-CM | POA: Diagnosis not present

## 2021-06-09 DIAGNOSIS — E1169 Type 2 diabetes mellitus with other specified complication: Secondary | ICD-10-CM | POA: Diagnosis not present

## 2021-06-09 DIAGNOSIS — E1122 Type 2 diabetes mellitus with diabetic chronic kidney disease: Secondary | ICD-10-CM | POA: Diagnosis not present

## 2021-06-09 DIAGNOSIS — Z23 Encounter for immunization: Secondary | ICD-10-CM | POA: Diagnosis not present

## 2021-06-09 DIAGNOSIS — E1159 Type 2 diabetes mellitus with other circulatory complications: Secondary | ICD-10-CM | POA: Diagnosis not present

## 2021-06-09 DIAGNOSIS — E1165 Type 2 diabetes mellitus with hyperglycemia: Secondary | ICD-10-CM | POA: Diagnosis not present

## 2021-06-09 DIAGNOSIS — N183 Chronic kidney disease, stage 3 unspecified: Secondary | ICD-10-CM | POA: Diagnosis not present

## 2021-06-09 DIAGNOSIS — E785 Hyperlipidemia, unspecified: Secondary | ICD-10-CM | POA: Diagnosis not present

## 2021-06-09 DIAGNOSIS — Z794 Long term (current) use of insulin: Secondary | ICD-10-CM | POA: Diagnosis not present

## 2021-06-20 ENCOUNTER — Ambulatory Visit: Payer: PPO | Admitting: Adult Health

## 2021-06-20 ENCOUNTER — Encounter: Payer: Self-pay | Admitting: Adult Health

## 2021-06-20 VITALS — BP 118/72 | HR 66 | Ht 64.0 in | Wt 280.0 lb

## 2021-06-20 DIAGNOSIS — E1149 Type 2 diabetes mellitus with other diabetic neurological complication: Secondary | ICD-10-CM

## 2021-06-20 DIAGNOSIS — I6523 Occlusion and stenosis of bilateral carotid arteries: Secondary | ICD-10-CM

## 2021-06-20 DIAGNOSIS — G459 Transient cerebral ischemic attack, unspecified: Secondary | ICD-10-CM | POA: Diagnosis not present

## 2021-06-20 DIAGNOSIS — I10 Essential (primary) hypertension: Secondary | ICD-10-CM | POA: Diagnosis not present

## 2021-06-20 DIAGNOSIS — Z794 Long term (current) use of insulin: Secondary | ICD-10-CM | POA: Diagnosis not present

## 2021-06-20 DIAGNOSIS — E785 Hyperlipidemia, unspecified: Secondary | ICD-10-CM | POA: Diagnosis not present

## 2021-06-20 NOTE — Progress Notes (Signed)
[facility] [address] [place]. [place] [address] [phone]       OFFICE FOLLOW UP NOTE  [name] Date of Birth:  [birth year] Medical Record Number:  [id number]   Reason for Referral: TIA follow up    SUBJECTIVE:   CHIEF COMPLAINT:  Chief Complaint  Patient presents with   Follow-up    Rm 3 alone Pt is well and stable      HPI:   Kristine Gray is a 70 y.o. female with PMHx significant for HTN, HLD, DM and TIA in setting of right hand numbness and aphasia on 07/14/2020 likely symptomatic from left ICA stenosis s/p CEA.   Today, 06/20/2021, returns for TIA follow-up.  Overall stable.  Denies stroke/TIA symptoms. Reports improvement of balance since prior visit - limited use of cane (uses for more security) - no recent falls.  Compliant on aspirin, Plavix and atorvastatin without side effects.  Blood pressure today 118/72.  Recent A1c 8.5 (down from 8.7) routinely monitored by endocrinology Dr. Forde Dandy.  Reports recent cataract procedure with OS approx 2 weeks ago and OD back in August -reports improvement of vision -followed by Dr. Kathlen Mody.  No new concerns at this time.    History provided for reference purposes only Update 12/13/2020 JM: Kristine Gray returns for TIA follow-up unaccompanied.  Doing well since prior visit without new or reoccurring stroke/TIA symptoms Chronic imbalance stable -continue to use a cane without any recent falls  Continues on aspirin and Plavix -denies side effects Continues on atorvastatin -denies side effects Blood pressure today 155/84 -does not routinely monitor at home. Per pt, nephrology recommended SBP goal >140s Glucose levels stable managed by endocrinology  Seen by Dr. Donzetta Matters [facility] 3/18 with CUS 1-39% b/l ICA stenosis - plans on repeat duplex 1 yr and per patient, was told to continue DAPT as well as statin Reports recent follow-up with ophthalmology - plans on undergoing cataract surgery this  summer  No new concerns at this time  Initial visit 08/17/2020 JM: Kristine Gray is being seen for hospital follow-up unaccompanied.  She initially was doing well after discharge but had episode of vertigo resulting in a fall on 11/18 at 2 AM.  Upon walking to the bathroom, she reports dizziness type sensation and then had difficulty standing upright, was able to sit on the toilet but felt as though she was getting pulled to her left side and ended up falling.  She does have a large area of bruising on her left side but no other injury.  She will experience occasional dizziness upon standing which resolves quickly.  She has not experienced any additional vertigo episodes.  She does report sensation of dizziness/vertigo prior to her CEA but resolved without any recurrence until 11/18.  She did not check her blood pressure during that time but did check BG which was normal.  Denies any other stroke/TIA symptoms.  She remains on aspirin 81 mg daily and Plavix with bruising post fall but denies bleeding.  Plans on follow-up with Dr. Donzetta Matters in 11/2020 to reevaluate carotid stenosis.  Also remains on atorvastatin 80 mg daily without myalgias.  Blood pressure today 147/77 monitors at home with SBP typically 140s.  Glucose levels have been stable currently managed by endocrinology Dr. Forde Dandy.  No further concerns at this time.  TIA admission 07/14/2020 Kristine Gray is a 70 y.o. female with history of HTN, DM, renal calculi, diverticulosis, arthritis and amenia who presented to [facility] 07/14/20 with transient acute R hand sensory numbness which progressed to acute onset aphasia - slurred speech and difficulty getting her words out which lasted 5 to 10 minutes.  Personally reviewed hospitalization pertinent progress notes, lab work and imaging with summary provided.  She was transferred to [facility] evaluated by Dr. Erlinda Hong with MRI negative for acute abnormality and likely L brain TIA secondary to symptomatic  L ICA stenosis with CTA showing bilateral ICA 70% stenosis s/p CEA 53/64 without complication.  Recommended DAPT for 3 weeks then Plavix alone as on aspirin PTA.  HTN stable and recommend long-term BP goal 130-150 given right ICA stenosis.  LDL 123 and increased home dose atorvastatin 20 mg to 80 mg daily.  Uncontrolled DM with A1c 9.0.  Other stroke risk factors include advanced age and morbid obesity but no prior stroke history.  She was discharged home in stable condition without therapy needs.    L brain TIA d/t symptomatic L ICA stenosis  Code Stroke CT head No acute abnormality. Moderate Small vessel disease.  CTA head & neck no ELVO. B ICA bulky plaque 70%. Moderate short proximal R P2 stenosis. Fetal L PCA. MRI  No acute abnormality. Moderate Small vessel disease.  2D Echo EF 70-75%. No source of embolus  LDL 123 HgbA1c 9.0 VTE prophylaxis - SCDs  aspirin 162mg  daily prior to admission, now on aspirin 325 mg daily and clopidogrel 75 mg daily following plavix load. Continue DAPT for 3 weeks and the plavix alone if OK with [facility].  Therapy recommendations:  No therapy needs Disposition:  Return home       ROS:   14 system review of systems performed and negative with exception of those listed in HPI  PMH:  Past Medical History:  Diagnosis Date   Anemia    Arthritis 04-02-12   arthritis lower back and spurs   Carotid artery occlusion    Diabetes mellitus 04-02-12   oral med and insulin used   Diverticulosis of colon 04-02-12   hx. of, no problems   H/O nephrostomy 04-02-12   03-19-12 tube placed and remains lt. flank.   Hypertension    Kidney calculi 04-02-12   hx. kidney stones-left, past hx. kidney stones   Obesity     PSH:  Past Surgical History:  Procedure Laterality Date   CATARACT EXTRACTION, BILATERAL  04/2021   CESAREAN SECTION  04/02/2012   '86   CHOLECYSTECTOMY     DILATION AND CURETTAGE OF UTERUS  04/02/2012   ENDARTERECTOMY Left 07/16/2020   Procedure:  ENDARTERECTOMY CAROTID;  Surgeon: Rosetta Posner, MD;  Location: [facility] OR;  Service: Vascular;  Laterality: Left;   iriodotomy  04/02/2012   bil. eyes   NEPHROLITHOTOMY  04/09/2012   Procedure: NEPHROLITHOTOMY PERCUTANEOUS;  Surgeon: Fredricka Bonine, MD;  Location: [facility] ORS;  Service: Urology;  Laterality: Left;   TONSILLECTOMY     TUBAL LIGATION  04/02/2012   '86    Social History:  Social History   Socioeconomic History   Marital status: Married    Spouse name: Not on file   Number of children: Not on file   Years of education: Not on file   Highest education level: Not on file  Occupational History   Not on file  Tobacco Use   Smoking status: Never   Smokeless tobacco: Never  Vaping Use   Vaping Use: Never used  Substance and Sexual Activity   Alcohol use: No   Drug use: No  Sexual activity: Yes  Other Topics Concern   Not on file  Social History Narrative   Not on file   Social Determinants of Health   Financial Resource Strain: Not on file  Food Insecurity: Not on file  Transportation Needs: Not on file  Physical Activity: Not on file  Stress: Not on file  Social Connections: Not on file  Intimate Partner Violence: Not on file    Family History:  Family History  Problem Relation Age of Onset   Cancer Other    Hypertension Other    Hyperlipidemia Other    Obesity Other    Heart attack Other    Breast cancer Maternal Aunt        in mid 60's    Medications:   Current Outpatient Medications on File Prior to Visit  Medication Sig Dispense Refill   allopurinol (ZYLOPRIM) 100 MG tablet TAKE 1 TABLET BY MOUTH ONCE DAILY 90 tablet 1   atorvastatin (LIPITOR) 80 MG tablet TAKE 1 TABLET (80 MG TOTAL) BY MOUTH DAILY. 90 tablet 4   clopidogrel (PLAVIX) 75 MG tablet TAKE 1 TABLET (75 MG TOTAL) BY MOUTH DAILY. 90 tablet 4   colchicine 0.6 MG tablet take 1 tablet by mouth daily as needed 90 tablet 3   Continuous Blood Gluc Sensor (FREESTYLE LIBRE 2 SENSOR) MISC  use to check blood sugar and change every 14 days 9 each 3   cyclobenzaprine (FLEXERIL) 10 MG tablet TAKE 1 TABLET BY MOUTH ONCE DAILY 90 tablet 3   fluconazole (DIFLUCAN) 150 MG tablet TAKE 1 TABLET BY MOUTH ONCE. MAY REPEAT IN 2 DAYS. 1 tablet 2   gabapentin (NEURONTIN) 300 MG capsule TAKE 1 CAPSULE BY MOUTH ONCE DAILY 90 capsule 3   Glucagon 3 MG/DOSE POWD USE AS DIRECTED WHEN NEEDED FOR SEVERE HYPOGLYCEMIA 2 each 6   hydrochlorothiazide (HYDRODIURIL) 25 MG tablet take 1 tablet by mouth every morning. 90 tablet 3   Insulin Pen Needle 31G X 5 MM MISC USE AS DIRECTED 3 TIMES DAILY 300 each 3   insulin regular human CONCENTRATED (HUMULIN R U-500 KWIKPEN) 500 UNIT/ML KwikPen Inject 135 units under the skin every morning, inject 90 units at lunch time and inject 50 units with dinner as directed 45 mL 3   nebivolol (BYSTOLIC) 10 MG tablet take 1 tablet by mouth twice a day. 270 tablet 3   ondansetron (ZOFRAN-ODT) 8 MG disintegrating tablet DISSOLVE 1 TABLET UNDER THE TONGUE EVERY 6 HOURS AS NEEDED 100 tablet 0   polyvinyl alcohol (LIQUIFILM TEARS) 1.4 % ophthalmic solution Place 1 drop into both eyes as needed for dry eyes.     No current facility-administered medications on file prior to visit.    Allergies:   Allergies  Allergen Reactions   Penicillins Hives, Itching and Swelling   Clindamycin/Lincomycin     Other reaction(s): Unknown Trouble breathing   Dapagliflozin     Other reaction(s): Dizziness   Valsartan Other (See Comments)    Bradycardia      OBJECTIVE:  Physical Exam  Vitals:   06/20/21 1433  BP: 118/72  Pulse: 66  Weight: 280 lb (127 kg)  Height: 5\' 4"  (1.626 m)    Body mass index is 48.06 kg/m. No results found.  General: Morbidly obese very pleasant elderly Caucasian female, seated, in no evident distress Head: head normocephalic and atraumatic.   Neck: supple with no carotid or supraclavicular bruits Cardiovascular: regular rate and rhythm, no  murmurs Musculoskeletal: no deformity Skin:  no  rash/petichiae Vascular:  Normal pulses all extremities   Neurologic Exam Mental Status: Awake and fully alert.  Fluent speech and language.  Oriented to place and time. Recent and remote memory intact. Attention span, concentration and fund of knowledge appropriate. Mood and affect appropriate.  Cranial Nerves: Pupils equal, briskly reactive to light. Extraocular movements full without nystagmus. Visual fields full to confrontation. Hearing intact. Facial sensation intact. Face, tongue, palate moves normally and symmetrically.  Motor: Normal bulk and tone. Normal strength in all tested extremity muscles. Sensory.: intact to touch , pinprick , position and vibratory sensation.  Coordination: Rapid alternating movements normal in all extremities. Finger-to-nose and heel-to-shin performed accurately bilaterally. Gait and Station: Arises from chair without difficulty. Stance is normal. Gait demonstrates normal stride length and balance without use of assistive device Reflexes: 1+ and symmetric. Toes downgoing.        ASSESSMENT: Kristine Gray is a 70 y.o. year old female presented with transient acute right hand sensory numbness which progressed to aphasia and slurred speech lasting approximately 5 to 10 minutes on 07/14/2020 likely left brain TIA secondary to symptomatic left ICA stenosis s/p CEA. Vascular risk factors include carotid stenosis, HTN, HLD, uncontrolled DM, advanced age and morbid obesity.      PLAN:  TIA :  Continue clopidogrel 75 mg daily  and atorvastatin for secondary stroke prevention.  Advised to discontinue aspirin as ongoing/prolonged DAPT not indicated from stroke standpoint Discussed secondary stroke prevention measures and importance of close PCP follow up for aggressive stroke risk factor management  Carotid stenosis: s/p L CEA; R ICA 70% stenosis; recent duplex 11/2020 b/l ICA 1 to 39% stenosis.  Plans on repeat  CUS around 11/2021 per [facility] recommendations HTN: BP goal <130/90..  Stable on hydrochlorothiazide and Bystolic per PCP HLD: LDL goal <70.  LDL 77 (05/2021) on atorvastatin 80 mg daily per PCP DMII: A1c goal<7.0. A1c 8.5 (05/2021) down from 8.7. On Humulin per endocrinology    Overall stable from stroke standpoint routinely followed by PCP and endocrinology for stroke risk factor management to recommend follow-up on an as-needed basis which patient agreed with   CC:  [facility] provider:  Dr. Orlena Sheldon, MD  (PCP) Waynetta Sandy, MD ([facility])  I spent 32 minutes of face-to-face and non-face-to-face time with patient.  This included previsit chart review, lab review, study review, electronic health record documentation, patient education regarding hx of TIA, carotid stenosis, secondary stroke prevention measures and importance of managing stroke risk factors and answered all other questions to patient satisfaction  Frann Rider, AGNP-BC  [facility] [address] [place], [place] [address]  Phone [phone] Fax [phone] Note: This document was prepared with digital dictation and possible smart phrase technology. Any transcriptional errors that result from this process are unintentional.

## 2021-06-20 NOTE — Patient Instructions (Signed)
Continue clopidogrel 75 mg daily  and atorvastatin  for secondary stroke prevention  Please stop aspirin 81mg  daily as no longer needed   Continue to follow up with PCP/endo regarding cholesterol, blood pressure and diabetes management  Maintain strict control of hypertension with blood pressure goal below 130/90, diabetes with hemoglobin A1c goal below 7% and cholesterol with LDL cholesterol (bad cholesterol) goal below 70 mg/dL.        Thank you for coming to see [place] at [facility]. I hope we have been able to provide you high quality care today.  You may receive a patient satisfaction survey over the next few weeks. We would appreciate your feedback and comments so that we may continue to improve ourselves and the health of our patients.

## 2021-06-26 NOTE — Progress Notes (Signed)
I agree with the above plan 

## 2021-07-14 ENCOUNTER — Other Ambulatory Visit: Payer: Self-pay | Admitting: Family Medicine

## 2021-07-14 DIAGNOSIS — Z1231 Encounter for screening mammogram for malignant neoplasm of breast: Secondary | ICD-10-CM

## 2021-08-09 DIAGNOSIS — D649 Anemia, unspecified: Secondary | ICD-10-CM | POA: Diagnosis not present

## 2021-08-09 DIAGNOSIS — G459 Transient cerebral ischemic attack, unspecified: Secondary | ICD-10-CM | POA: Diagnosis not present

## 2021-08-09 DIAGNOSIS — N1832 Chronic kidney disease, stage 3b: Secondary | ICD-10-CM | POA: Diagnosis not present

## 2021-08-09 DIAGNOSIS — E785 Hyperlipidemia, unspecified: Secondary | ICD-10-CM | POA: Diagnosis not present

## 2021-08-09 DIAGNOSIS — I5189 Other ill-defined heart diseases: Secondary | ICD-10-CM | POA: Diagnosis not present

## 2021-08-09 DIAGNOSIS — I700 Atherosclerosis of aorta: Secondary | ICD-10-CM | POA: Diagnosis not present

## 2021-08-09 DIAGNOSIS — I6523 Occlusion and stenosis of bilateral carotid arteries: Secondary | ICD-10-CM | POA: Diagnosis not present

## 2021-08-09 DIAGNOSIS — I129 Hypertensive chronic kidney disease with stage 1 through stage 4 chronic kidney disease, or unspecified chronic kidney disease: Secondary | ICD-10-CM | POA: Diagnosis not present

## 2021-08-09 DIAGNOSIS — E1129 Type 2 diabetes mellitus with other diabetic kidney complication: Secondary | ICD-10-CM | POA: Diagnosis not present

## 2021-08-09 DIAGNOSIS — Z794 Long term (current) use of insulin: Secondary | ICD-10-CM | POA: Diagnosis not present

## 2021-08-10 ENCOUNTER — Other Ambulatory Visit: Payer: Self-pay | Admitting: Vascular Surgery

## 2021-08-10 ENCOUNTER — Other Ambulatory Visit (HOSPITAL_BASED_OUTPATIENT_CLINIC_OR_DEPARTMENT_OTHER): Payer: Self-pay

## 2021-08-10 MED ORDER — CLOPIDOGREL BISULFATE 75 MG PO TABS
75.0000 mg | ORAL_TABLET | Freq: Every day | ORAL | 4 refills | Status: DC
  Filled 2021-08-10: qty 90, 90d supply, fill #0
  Filled 2021-11-15: qty 90, 90d supply, fill #1
  Filled 2022-02-01: qty 90, 90d supply, fill #2
  Filled 2022-05-07: qty 90, 90d supply, fill #3
  Filled 2022-08-08: qty 90, 90d supply, fill #4

## 2021-08-10 MED ORDER — ATORVASTATIN CALCIUM 80 MG PO TABS
80.0000 mg | ORAL_TABLET | Freq: Every day | ORAL | 4 refills | Status: DC
Start: 2021-08-10 — End: 2022-11-13
  Filled 2021-08-10: qty 90, 90d supply, fill #0
  Filled 2021-11-15: qty 90, 90d supply, fill #1
  Filled 2022-02-01: qty 90, 90d supply, fill #2
  Filled 2022-05-07: qty 90, 90d supply, fill #3
  Filled 2022-08-08: qty 90, 90d supply, fill #4

## 2021-08-10 MED FILL — Insulin Pen Needle 31 G X 5 MM (1/5" or 3/16"): 90 days supply | Qty: 300 | Fill #0 | Status: AC

## 2021-08-22 ENCOUNTER — Other Ambulatory Visit: Payer: Self-pay

## 2021-08-22 ENCOUNTER — Ambulatory Visit
Admission: RE | Admit: 2021-08-22 | Discharge: 2021-08-22 | Disposition: A | Discharge status: Home / Self Care | Payer: PPO | Source: Ambulatory Visit | Attending: Family Medicine | Admitting: Family Medicine

## 2021-08-22 DIAGNOSIS — Z1231 Encounter for screening mammogram for malignant neoplasm of breast: Secondary | ICD-10-CM

## 2021-08-24 ENCOUNTER — Other Ambulatory Visit (HOSPITAL_BASED_OUTPATIENT_CLINIC_OR_DEPARTMENT_OTHER): Payer: Self-pay

## 2021-08-29 ENCOUNTER — Other Ambulatory Visit (HOSPITAL_BASED_OUTPATIENT_CLINIC_OR_DEPARTMENT_OTHER): Payer: Self-pay

## 2021-08-29 MED ORDER — GABAPENTIN 300 MG PO CAPS
300.0000 mg | ORAL_CAPSULE | Freq: Every day | ORAL | 3 refills | Status: DC
  Filled 2021-08-29: qty 90, 90d supply, fill #0
  Filled 2021-11-15: qty 90, 90d supply, fill #1
  Filled 2022-02-01: qty 90, 90d supply, fill #2
  Filled 2022-05-07: qty 90, 90d supply, fill #3

## 2021-08-29 MED ORDER — CYCLOBENZAPRINE HCL 10 MG PO TABS
10.0000 mg | ORAL_TABLET | Freq: Every day | ORAL | 3 refills | Status: DC
  Filled 2021-08-29: qty 90, 90d supply, fill #0
  Filled 2021-11-15: qty 90, 90d supply, fill #1
  Filled 2022-02-01: qty 90, 90d supply, fill #2
  Filled 2022-05-07: qty 90, 90d supply, fill #3

## 2021-08-31 ENCOUNTER — Other Ambulatory Visit (HOSPITAL_BASED_OUTPATIENT_CLINIC_OR_DEPARTMENT_OTHER): Payer: Self-pay

## 2021-09-02 ENCOUNTER — Other Ambulatory Visit (HOSPITAL_BASED_OUTPATIENT_CLINIC_OR_DEPARTMENT_OTHER): Payer: Self-pay

## 2021-09-02 MED ORDER — CEPHALEXIN 500 MG PO CAPS
500.0000 mg | ORAL_CAPSULE | Freq: Four times a day (QID) | ORAL | 0 refills | Status: DC
  Filled 2021-09-02: qty 40, 10d supply, fill #0

## 2021-10-06 DIAGNOSIS — I700 Atherosclerosis of aorta: Secondary | ICD-10-CM | POA: Diagnosis not present

## 2021-10-06 DIAGNOSIS — E1129 Type 2 diabetes mellitus with other diabetic kidney complication: Secondary | ICD-10-CM | POA: Diagnosis not present

## 2021-10-06 DIAGNOSIS — E785 Hyperlipidemia, unspecified: Secondary | ICD-10-CM | POA: Diagnosis not present

## 2021-10-06 DIAGNOSIS — I129 Hypertensive chronic kidney disease with stage 1 through stage 4 chronic kidney disease, or unspecified chronic kidney disease: Secondary | ICD-10-CM | POA: Diagnosis not present

## 2021-10-06 DIAGNOSIS — N1832 Chronic kidney disease, stage 3b: Secondary | ICD-10-CM | POA: Diagnosis not present

## 2021-10-06 DIAGNOSIS — E049 Nontoxic goiter, unspecified: Secondary | ICD-10-CM | POA: Diagnosis not present

## 2021-10-06 DIAGNOSIS — E559 Vitamin D deficiency, unspecified: Secondary | ICD-10-CM | POA: Diagnosis not present

## 2021-10-06 DIAGNOSIS — Z794 Long term (current) use of insulin: Secondary | ICD-10-CM | POA: Diagnosis not present

## 2021-11-08 ENCOUNTER — Other Ambulatory Visit (HOSPITAL_BASED_OUTPATIENT_CLINIC_OR_DEPARTMENT_OTHER): Payer: Self-pay

## 2021-11-08 MED ORDER — CHLORHEXIDINE GLUCONATE 0.12 % MT SOLN
OROMUCOSAL | 2 refills | Status: DC
  Filled 2021-11-08: qty 473, 14d supply, fill #0

## 2021-11-08 MED ORDER — CEPHALEXIN 500 MG PO CAPS
500.0000 mg | ORAL_CAPSULE | Freq: Four times a day (QID) | ORAL | 0 refills | Status: DC
  Filled 2021-11-08: qty 40, 10d supply, fill #0

## 2021-11-15 ENCOUNTER — Other Ambulatory Visit: Payer: Self-pay | Admitting: Pharmacotherapy

## 2021-11-16 ENCOUNTER — Other Ambulatory Visit (HOSPITAL_BASED_OUTPATIENT_CLINIC_OR_DEPARTMENT_OTHER): Payer: Self-pay

## 2021-11-16 MED ORDER — INSULIN PEN NEEDLE 31G X 5 MM MISC
3 refills | Status: DC
  Filled 2021-11-16: qty 300, 100d supply, fill #0
  Filled 2022-02-01: qty 300, 100d supply, fill #1
  Filled 2022-05-07 – 2022-05-08 (×2): qty 300, 100d supply, fill #2
  Filled 2022-11-13: qty 300, 100d supply, fill #3

## 2021-11-26 ENCOUNTER — Emergency Department (HOSPITAL_COMMUNITY): Payer: PPO

## 2021-11-26 ENCOUNTER — Inpatient Hospital Stay (HOSPITAL_COMMUNITY)
Admission: EM | Admit: 2021-11-26 | Discharge: 2021-11-30 | DRG: 690 | Disposition: A | Discharge status: Home / Self Care | Payer: PPO | Attending: Internal Medicine | Admitting: Internal Medicine

## 2021-11-26 ENCOUNTER — Encounter (HOSPITAL_COMMUNITY): Payer: Self-pay | Admitting: Internal Medicine

## 2021-11-26 ENCOUNTER — Other Ambulatory Visit: Payer: Self-pay

## 2021-11-26 DIAGNOSIS — M47816 Spondylosis without myelopathy or radiculopathy, lumbar region: Secondary | ICD-10-CM | POA: Diagnosis present

## 2021-11-26 DIAGNOSIS — M109 Gout, unspecified: Secondary | ICD-10-CM | POA: Clinically undetermined

## 2021-11-26 DIAGNOSIS — Z881 Allergy status to other antibiotic agents status: Secondary | ICD-10-CM | POA: Diagnosis not present

## 2021-11-26 DIAGNOSIS — Z8673 Personal history of transient ischemic attack (TIA), and cerebral infarction without residual deficits: Secondary | ICD-10-CM | POA: Diagnosis not present

## 2021-11-26 DIAGNOSIS — Z043 Encounter for examination and observation following other accident: Secondary | ICD-10-CM | POA: Diagnosis not present

## 2021-11-26 DIAGNOSIS — M7989 Other specified soft tissue disorders: Secondary | ICD-10-CM | POA: Diagnosis not present

## 2021-11-26 DIAGNOSIS — S060X0A Concussion without loss of consciousness, initial encounter: Secondary | ICD-10-CM | POA: Diagnosis not present

## 2021-11-26 DIAGNOSIS — R531 Weakness: Secondary | ICD-10-CM | POA: Diagnosis not present

## 2021-11-26 DIAGNOSIS — Z20822 Contact with and (suspected) exposure to covid-19: Secondary | ICD-10-CM | POA: Diagnosis present

## 2021-11-26 DIAGNOSIS — Z880 Allergy status to penicillin: Secondary | ICD-10-CM

## 2021-11-26 DIAGNOSIS — M1711 Unilateral primary osteoarthritis, right knee: Secondary | ICD-10-CM | POA: Diagnosis not present

## 2021-11-26 DIAGNOSIS — N1832 Chronic kidney disease, stage 3b: Secondary | ICD-10-CM | POA: Diagnosis present

## 2021-11-26 DIAGNOSIS — T502X5A Adverse effect of carbonic-anhydrase inhibitors, benzothiadiazides and other diuretics, initial encounter: Secondary | ICD-10-CM | POA: Diagnosis not present

## 2021-11-26 DIAGNOSIS — R55 Syncope and collapse: Secondary | ICD-10-CM | POA: Diagnosis present

## 2021-11-26 DIAGNOSIS — N390 Urinary tract infection, site not specified: Secondary | ICD-10-CM | POA: Diagnosis not present

## 2021-11-26 DIAGNOSIS — Y92019 Unspecified place in single-family (private) house as the place of occurrence of the external cause: Secondary | ICD-10-CM | POA: Diagnosis not present

## 2021-11-26 DIAGNOSIS — Z794 Long term (current) use of insulin: Secondary | ICD-10-CM

## 2021-11-26 DIAGNOSIS — E119 Type 2 diabetes mellitus without complications: Secondary | ICD-10-CM

## 2021-11-26 DIAGNOSIS — Z79899 Other long term (current) drug therapy: Secondary | ICD-10-CM

## 2021-11-26 DIAGNOSIS — Z7902 Long term (current) use of antithrombotics/antiplatelets: Secondary | ICD-10-CM

## 2021-11-26 DIAGNOSIS — S0012XA Contusion of left eyelid and periocular area, initial encounter: Secondary | ICD-10-CM | POA: Diagnosis present

## 2021-11-26 DIAGNOSIS — E1122 Type 2 diabetes mellitus with diabetic chronic kidney disease: Secondary | ICD-10-CM | POA: Diagnosis present

## 2021-11-26 DIAGNOSIS — I700 Atherosclerosis of aorta: Secondary | ICD-10-CM | POA: Diagnosis not present

## 2021-11-26 DIAGNOSIS — E7841 Elevated Lipoprotein(a): Secondary | ICD-10-CM | POA: Diagnosis not present

## 2021-11-26 DIAGNOSIS — E785 Hyperlipidemia, unspecified: Secondary | ICD-10-CM | POA: Diagnosis present

## 2021-11-26 DIAGNOSIS — S0990XA Unspecified injury of head, initial encounter: Secondary | ICD-10-CM | POA: Diagnosis not present

## 2021-11-26 DIAGNOSIS — Z888 Allergy status to other drugs, medicaments and biological substances status: Secondary | ICD-10-CM | POA: Diagnosis not present

## 2021-11-26 DIAGNOSIS — E1149 Type 2 diabetes mellitus with other diabetic neurological complication: Secondary | ICD-10-CM | POA: Diagnosis not present

## 2021-11-26 DIAGNOSIS — Z803 Family history of malignant neoplasm of breast: Secondary | ICD-10-CM | POA: Diagnosis not present

## 2021-11-26 DIAGNOSIS — E860 Dehydration: Secondary | ICD-10-CM | POA: Diagnosis present

## 2021-11-26 DIAGNOSIS — E669 Obesity, unspecified: Secondary | ICD-10-CM | POA: Diagnosis present

## 2021-11-26 DIAGNOSIS — W1830XA Fall on same level, unspecified, initial encounter: Secondary | ICD-10-CM | POA: Diagnosis present

## 2021-11-26 DIAGNOSIS — S0011XA Contusion of right eyelid and periocular area, initial encounter: Secondary | ICD-10-CM | POA: Diagnosis present

## 2021-11-26 DIAGNOSIS — G459 Transient cerebral ischemic attack, unspecified: Secondary | ICD-10-CM | POA: Diagnosis not present

## 2021-11-26 DIAGNOSIS — M160 Bilateral primary osteoarthritis of hip: Secondary | ICD-10-CM | POA: Diagnosis not present

## 2021-11-26 DIAGNOSIS — I959 Hypotension, unspecified: Secondary | ICD-10-CM | POA: Diagnosis not present

## 2021-11-26 DIAGNOSIS — Z6841 Body Mass Index (BMI) 40.0 and over, adult: Secondary | ICD-10-CM | POA: Diagnosis not present

## 2021-11-26 DIAGNOSIS — S0003XA Contusion of scalp, initial encounter: Secondary | ICD-10-CM | POA: Diagnosis not present

## 2021-11-26 DIAGNOSIS — Z8249 Family history of ischemic heart disease and other diseases of the circulatory system: Secondary | ICD-10-CM | POA: Diagnosis not present

## 2021-11-26 DIAGNOSIS — I213 ST elevation (STEMI) myocardial infarction of unspecified site: Secondary | ICD-10-CM | POA: Diagnosis not present

## 2021-11-26 DIAGNOSIS — I517 Cardiomegaly: Secondary | ICD-10-CM | POA: Diagnosis not present

## 2021-11-26 DIAGNOSIS — I129 Hypertensive chronic kidney disease with stage 1 through stage 4 chronic kidney disease, or unspecified chronic kidney disease: Secondary | ICD-10-CM | POA: Diagnosis not present

## 2021-11-26 DIAGNOSIS — E871 Hypo-osmolality and hyponatremia: Secondary | ICD-10-CM | POA: Diagnosis present

## 2021-11-26 DIAGNOSIS — M19011 Primary osteoarthritis, right shoulder: Secondary | ICD-10-CM | POA: Diagnosis not present

## 2021-11-26 DIAGNOSIS — W19XXXA Unspecified fall, initial encounter: Secondary | ICD-10-CM

## 2021-11-26 DIAGNOSIS — I10 Essential (primary) hypertension: Secondary | ICD-10-CM | POA: Diagnosis not present

## 2021-11-26 LAB — GLUCOSE, CAPILLARY
Glucose-Capillary: 235 mg/dL — ABNORMAL HIGH (ref 70–99)
Glucose-Capillary: 105 mg/dL — ABNORMAL HIGH (ref 70–99)

## 2021-11-26 LAB — URINALYSIS, ROUTINE W REFLEX MICROSCOPIC
Bilirubin Urine: NEGATIVE
Glucose, UA: >=500 mg/dL — AB
Ketones, ur: 5 mg/dL — AB
Nitrite: NEGATIVE
Protein, ur: 30 mg/dL — AB
Specific Gravity, Urine: 1.008 (ref 1.005–1.030)
WBC, UA: >50 WBC/hpf — ABNORMAL HIGH (ref 0–5)
pH: 6 (ref 5.0–8.0)

## 2021-11-26 LAB — CBC WITH DIFFERENTIAL/PLATELET
Abs Immature Granulocytes: 0.05 10*3/uL (ref 0.00–0.07)
Basophils Absolute: 0 10*3/uL (ref 0.0–0.1)
Basophils Relative: 0 %
Eosinophils Absolute: 0.1 10*3/uL (ref 0.0–0.5)
Eosinophils Relative: 0 %
HCT: 40.1 % (ref 36.0–46.0)
Hemoglobin: 13.9 g/dL (ref 12.0–15.0)
Immature Granulocytes: 0 %
Lymphocytes Relative: 19 %
Lymphs Abs: 2.4 10*3/uL (ref 0.7–4.0)
MCH: 31.7 pg (ref 26.0–34.0)
MCHC: 34.7 g/dL (ref 30.0–36.0)
MCV: 91.3 fL (ref 80.0–100.0)
Monocytes Absolute: 1.7 10*3/uL — ABNORMAL HIGH (ref 0.1–1.0)
Monocytes Relative: 14 %
Neutro Abs: 8.5 10*3/uL — ABNORMAL HIGH (ref 1.7–7.7)
Neutrophils Relative %: 67 %
Platelets: 235 10*3/uL (ref 150–400)
RBC: 4.39 MIL/uL (ref 3.87–5.11)
RDW: 14.1 % (ref 11.5–15.5)
WBC: 12.8 10*3/uL — ABNORMAL HIGH (ref 4.0–10.5)
nRBC: 0 % (ref 0.0–0.2)

## 2021-11-26 LAB — CBC
HCT: 40.7 % (ref 36.0–46.0)
Hemoglobin: 13.6 g/dL (ref 12.0–15.0)
MCH: 30.5 pg (ref 26.0–34.0)
MCHC: 33.4 g/dL (ref 30.0–36.0)
MCV: 91.3 fL (ref 80.0–100.0)
Platelets: 225 10*3/uL (ref 150–400)
RBC: 4.46 MIL/uL (ref 3.87–5.11)
RDW: 14.2 % (ref 11.5–15.5)
WBC: 13 10*3/uL — ABNORMAL HIGH (ref 4.0–10.5)
nRBC: 0 % (ref 0.0–0.2)

## 2021-11-26 LAB — BASIC METABOLIC PANEL
Anion gap: 16 — ABNORMAL HIGH (ref 5–15)
BUN: 16 mg/dL (ref 8–23)
CO2: 24 mmol/L (ref 22–32)
Calcium: 9 mg/dL (ref 8.9–10.3)
Chloride: 91 mmol/L — ABNORMAL LOW (ref 98–111)
Creatinine, Ser: 1.71 mg/dL — ABNORMAL HIGH (ref 0.44–1.00)
GFR, Estimated: 32 mL/min — ABNORMAL LOW (ref >60)
Glucose, Bld: 233 mg/dL — ABNORMAL HIGH (ref 70–99)
Potassium: 3.7 mmol/L (ref 3.5–5.1)
Sodium: 131 mmol/L — ABNORMAL LOW (ref 135–145)

## 2021-11-26 LAB — COMPREHENSIVE METABOLIC PANEL
ALT: 21 U/L (ref 0–44)
AST: 25 U/L (ref 15–41)
Albumin: 3.6 g/dL (ref 3.5–5.0)
Alkaline Phosphatase: 70 U/L (ref 38–126)
Anion gap: 14 (ref 5–15)
BUN: 17 mg/dL (ref 8–23)
CO2: 26 mmol/L (ref 22–32)
Calcium: 9 mg/dL (ref 8.9–10.3)
Chloride: 90 mmol/L — ABNORMAL LOW (ref 98–111)
Creatinine, Ser: 1.88 mg/dL — ABNORMAL HIGH (ref 0.44–1.00)
GFR, Estimated: 28 mL/min — ABNORMAL LOW (ref >60)
Glucose, Bld: 258 mg/dL — ABNORMAL HIGH (ref 70–99)
Potassium: 4.2 mmol/L (ref 3.5–5.1)
Sodium: 130 mmol/L — ABNORMAL LOW (ref 135–145)
Total Bilirubin: 1.4 mg/dL — ABNORMAL HIGH (ref 0.3–1.2)
Total Protein: 6.6 g/dL (ref 6.5–8.1)

## 2021-11-26 LAB — TROPONIN I (HIGH SENSITIVITY)
Troponin I (High Sensitivity): 9 ng/L (ref <18)
Troponin I (High Sensitivity): 9 ng/L (ref <18)

## 2021-11-26 LAB — CBG MONITORING, ED
Glucose-Capillary: 199 mg/dL — ABNORMAL HIGH (ref 70–99)
Glucose-Capillary: 236 mg/dL — ABNORMAL HIGH (ref 70–99)
Glucose-Capillary: 251 mg/dL — ABNORMAL HIGH (ref 70–99)

## 2021-11-26 LAB — HEMOGLOBIN A1C
Hgb A1c MFr Bld: 8.1 % — ABNORMAL HIGH (ref 4.8–5.6)
Mean Plasma Glucose: 185.77 mg/dL

## 2021-11-26 LAB — RESP PANEL BY RT-PCR (FLU A&B, COVID) ARPGX2
Influenza A by PCR: NEGATIVE
Influenza B by PCR: NEGATIVE
SARS Coronavirus 2 by RT PCR: NEGATIVE

## 2021-11-26 MED ORDER — NEBIVOLOL HCL 10 MG PO TABS
10.0000 mg | ORAL_TABLET | Freq: Two times a day (BID) | ORAL | Status: DC
Start: 2021-11-27 — End: 2021-11-26

## 2021-11-26 MED ORDER — ATORVASTATIN CALCIUM 80 MG PO TABS
80.0000 mg | ORAL_TABLET | Freq: Every evening | ORAL | Status: DC
  Administered 2021-11-26 – 2021-11-29 (×4): 80 mg via ORAL
  Filled 2021-11-26 (×4): qty 1

## 2021-11-26 MED ORDER — LACTATED RINGERS IV SOLN: INTRAVENOUS | Status: DC

## 2021-11-26 MED ORDER — NEBIVOLOL HCL 10 MG PO TABS
10.0000 mg | ORAL_TABLET | Freq: Two times a day (BID) | ORAL | Status: DC
Start: 2021-11-26 — End: 2021-11-26
  Filled 2021-11-26: qty 1

## 2021-11-26 MED ORDER — ACETAMINOPHEN 650 MG RE SUPP: 650.0000 mg | Freq: Four times a day (QID) | RECTAL | Status: DC | PRN

## 2021-11-26 MED ORDER — HYDRALAZINE HCL 20 MG/ML IJ SOLN
10.0000 mg | INTRAMUSCULAR | Status: DC | PRN
Start: 2021-11-26 — End: 2021-11-30
  Administered 2021-11-27 – 2021-11-30 (×2): 10 mg via INTRAVENOUS
  Filled 2021-11-26 (×2): qty 1

## 2021-11-26 MED ORDER — CYCLOBENZAPRINE HCL 10 MG PO TABS
10.0000 mg | ORAL_TABLET | Freq: Every day | ORAL | Status: DC
  Administered 2021-11-26 – 2021-11-30 (×5): 10 mg via ORAL
  Filled 2021-11-26 (×5): qty 1

## 2021-11-26 MED ORDER — INSULIN REGULAR HUMAN (CONC) 500 UNIT/ML ~~LOC~~ SOPN
50.0000 [IU] | PEN_INJECTOR | Freq: Three times a day (TID) | SUBCUTANEOUS | Status: DC
  Administered 2021-11-26 – 2021-11-30 (×13): 50 [IU] via SUBCUTANEOUS
  Filled 2021-11-26 (×2): qty 3

## 2021-11-26 MED ORDER — LACTATED RINGERS IV BOLUS
1000.0000 mL | Freq: Once | INTRAVENOUS | Status: AC
  Administered 2021-11-26: 1000 mL via INTRAVENOUS

## 2021-11-26 MED ORDER — CEFTRIAXONE SODIUM 1 G IJ SOLR
1.0000 g | Freq: Once | INTRAMUSCULAR | Status: AC
  Administered 2021-11-26: 1 g via INTRAVENOUS
  Filled 2021-11-26: qty 10

## 2021-11-26 MED ORDER — VITAMIN B-12 1000 MCG PO TABS
5000.0000 ug | ORAL_TABLET | Freq: Every day | ORAL | Status: DC
  Administered 2021-11-26 – 2021-11-30 (×5): 5000 ug via ORAL
  Filled 2021-11-26 (×5): qty 5

## 2021-11-26 MED ORDER — SODIUM CHLORIDE 0.9 % IV SOLN: 1.0000 g | INTRAVENOUS | Status: DC

## 2021-11-26 MED ORDER — SODIUM CHLORIDE 0.9 % IV SOLN
2.0000 g | INTRAVENOUS | Status: DC
  Administered 2021-11-27 – 2021-11-28 (×2): 2 g via INTRAVENOUS
  Filled 2021-11-26 (×2): qty 20

## 2021-11-26 MED ORDER — ACETAMINOPHEN 325 MG PO TABS
650.0000 mg | ORAL_TABLET | Freq: Four times a day (QID) | ORAL | Status: DC | PRN
  Administered 2021-11-27: 650 mg via ORAL
  Filled 2021-11-26: qty 2

## 2021-11-26 MED ORDER — NEBIVOLOL HCL 10 MG PO TABS
10.0000 mg | ORAL_TABLET | Freq: Two times a day (BID) | ORAL | Status: DC
  Administered 2021-11-26 (×2): 10 mg via ORAL
  Filled 2021-11-26 (×3): qty 1

## 2021-11-26 MED ORDER — INSULIN ASPART 100 UNIT/ML IJ SOLN
0.0000 [IU] | Freq: Three times a day (TID) | INTRAMUSCULAR | Status: DC
  Administered 2021-11-26: 3 [IU] via SUBCUTANEOUS
  Administered 2021-11-26: 2 [IU] via SUBCUTANEOUS
  Administered 2021-11-26 – 2021-11-27 (×2): 5 [IU] via SUBCUTANEOUS
  Administered 2021-11-27 – 2021-11-28 (×3): 3 [IU] via SUBCUTANEOUS
  Administered 2021-11-28 – 2021-11-29 (×2): 1 [IU] via SUBCUTANEOUS
  Administered 2021-11-29 – 2021-11-30 (×3): 2 [IU] via SUBCUTANEOUS
  Administered 2021-11-30: 3 [IU] via SUBCUTANEOUS

## 2021-11-26 MED ORDER — ALLOPURINOL 100 MG PO TABS
100.0000 mg | ORAL_TABLET | Freq: Every day | ORAL | Status: DC
Start: 2021-11-26 — End: 2021-11-30
  Administered 2021-11-26 – 2021-11-30 (×5): 100 mg via ORAL
  Filled 2021-11-26 (×5): qty 1

## 2021-11-26 NOTE — Care Management Obs Status (Addendum)
MEDICARE OBSERVATION STATUS NOTIFICATION ? ? ?Patient Details  ?Name: Kristine Gray ?MRN: [id number] ?Date of Birth: [birth year] ? ? ?Medicare Observation Status Notification Given:  Yes ?Copy attached to discharge patient instructions. ? ? ?Verdell Carmine, RN ?11/26/2021, 1:17 PM ?

## 2021-11-26 NOTE — Progress Notes (Signed)
PROGRESS NOTE    Kristine Gray  YTK:[id number] DOB: 1951/06/18 DOA: 11/26/2021 PCP: Bernerd Limbo, MD    Chief Complaint  Patient presents with   Fall    Brief Narrative:  Patient 71 year old female prior history of TIA, type 2 diabetes, hypertension, CKD stage III brought to the ED as she had difficulty getting up from the commode.  Patient's husband checked her blood pressure noted to be hypotensive with systolic in the 75T.  Patient noted the day prior to admission had a fall hit her face and sustained bruises on the scalp and around her eyes.  Patient also noted to have some episodes of nausea and vomiting.  Patient brought by EMS to the ED noted to have systolic blood pressures in the 100s improved with IV fluids.  Urinalysis concerning for UTI.  CT head and CT maxillofacial showing large scalp hematoma, periorbital hematoma.  CT head unremarkable.  CT C-spine negative.  Plain films negative for any fracture.  Patient placed on IV antibiotics, IV fluids and admitted for further evaluation and management.   Assessment & Plan:   Principal Problem:   Near syncope Active Problems:   DM2 (diabetes mellitus, type 2) (HCC)   HTN (hypertension)   Hyperlipidemia   TIA (transient ischemic attack)   UTI (urinary tract infection)  #1 near syncopal episode -Likely multifactorial secondary to dehydration from emesis, poor oral intake after full in the setting of probable UTI and diuretics. -Blood pressure responded to IV fluids. -CT head negative for any acute intracranial abnormalities. -Check urine cultures.  Check blood cultures. -COVID-19 PCR negative. -Continue IV fluids, IV Rocephin. -PT/OT.  2.  UTI -Check urine cultures. -Continue IV Rocephin.  3.  Dehydration -IV fluids.  4.  Hypertension -Continue to hold HCTZ. -Continue home regimen of Bystolic.  5.  Diabetes mellitus type 2 -Hemoglobin A1c 8.1 (11/26/2021) -Patient noted on insulin U-500. -Per RN patient noted  to have some bouts of hypoglycemia at home and as such we will decrease home dose of U-500 to 50 3 times daily. -Consult with diabetic coordinator.  6.  Nausea/vomiting -Likely secondary to concussion. -Abdomen benign. -IV fluids, IV antiemetics, supportive care.  7.  History of TIA -Patient noted to be on Plavix however currently on hold due to large scalp and periorbital hematoma. -PT/OT.  8.  CKD stage IIIb -Currently at baseline. -IV fluids.  9. mild hyponatremia -Likely secondary to hypovolemic hyponatremia in the setting of HCTZ. -HCTZ held. -IV fluids. -Follow.  10.  Scalp hematoma status post fall -Warm/cold compresses. -Continue to hold Plavix.   DVT prophylaxis: SCDs Code Status: Full Family Communication: Updated patient.  No family at bedside. Disposition:   Status is: Observation The patient remains OBS appropriate and will d/c before 2 midnights.         Consultants:  None  Procedures:  CT head 11/26/2021 CT maxillofacial 11/26/2021 CT C-spine 11/26/2021 Chest x-ray 11/26/2021 Plain films of the right ankle 11/26/2021 Plan.  The left forearm 11/26/2021 Plain films of the right knee 11/26/2021 Plain films of the pelvis 11/26/2021 Plain films of the right shoulder 11/26/2021  Antimicrobials:  IV Rocephin 11/26/2021>>>>   Subjective: Sitting up in gurney.  Overall feeling better.  No lightheadedness or dizziness no further syncope noted.  No chest pain no shortness of breath.  Objective: Vitals:   11/26/21 0715 11/26/21 0730 11/26/21 0800 11/26/21 0900  BP: (!) 170/77 (!) 155/77 (!) 164/74 100/76  Pulse: 73 73 71 80  Resp: 14 13  12 (!) 22  Temp:      TempSrc:      SpO2: 100% 98% 98% 97%  Weight:      Height:       No intake or output data in the 24 hours ending 11/26/21 1005 Filed Weights   11/26/21 0043  Weight: 122.9 kg    Examination:  General exam: Appears calm and comfortable.  Hematoma noted on the left head.  Ecchymosis noted around  eyes. Respiratory system: Clear to auscultation anterior lung fields.  No wheezes, no crackles, no rhonchi.  Respiratory effort normal. Cardiovascular system: S1 & S2 heard, RRR. No JVD, murmurs, rubs, gallops or clicks. No pedal edema. Gastrointestinal system: Abdomen is nondistended, soft and nontender. No organomegaly or masses felt. Normal bowel sounds heard. Central nervous system: Alert and oriented. No focal neurological deficits. Extremities: Symmetric 5 x 5 power. Skin: No rashes, lesions or ulcers Psychiatry: Judgement and insight appear normal. Mood & affect appropriate.     Data Reviewed: I have personally reviewed following labs and imaging studies  CBC: Recent Labs  Lab 11/26/21 0057 11/26/21 0356  WBC 12.8* 13.0*  NEUTROABS 8.5*  --   HGB 13.9 13.6  HCT 40.1 40.7  MCV 91.3 91.3  PLT 235 591    Basic Metabolic Panel: Recent Labs  Lab 11/26/21 0057 11/26/21 0356  NA 130* 131*  K 4.2 3.7  CL 90* 91*  CO2 26 24  GLUCOSE 258* 233*  BUN 17 16  CREATININE 1.88* 1.71*  CALCIUM 9.0 9.0    GFR: Estimated Creatinine Clearance: 39.1 mL/min (A) (by C-G formula based on SCr of 1.71 mg/dL (H)).  Liver Function Tests: Recent Labs  Lab 11/26/21 0057  AST 25  ALT 21  ALKPHOS 70  BILITOT 1.4*  PROT 6.6  ALBUMIN 3.6    CBG: Recent Labs  Lab 11/26/21 0050 11/26/21 0754  GLUCAP 236* 199*     Recent Results (from the past 240 hour(s))  Resp Panel by RT-PCR (Flu A&B, Covid) Nasopharyngeal Swab     Status: None   Collection Time: 11/26/21  5:52 AM   Specimen: Nasopharyngeal Swab; Nasopharyngeal(NP) swabs in vial transport medium  Result Value Ref Range Status   SARS Coronavirus 2 by RT PCR NEGATIVE NEGATIVE Final    Comment: (NOTE) SARS-CoV-2 target nucleic acids are NOT DETECTED.  The SARS-CoV-2 RNA is generally detectable in upper respiratory specimens during the acute phase of infection. The lowest concentration of SARS-CoV-2 viral copies this  assay can detect is 138 copies/mL. A negative result does not preclude SARS-Cov-2 infection and should not be used as the sole basis for treatment or other patient management decisions. A negative result may occur with  improper specimen collection/handling, submission of specimen other than nasopharyngeal swab, presence of viral mutation(s) within the areas targeted by this assay, and inadequate number of viral copies(<138 copies/mL). A negative result must be combined with clinical observations, patient history, and epidemiological information. The expected result is Negative.  Fact Sheet for Patients:  EntrepreneurPulse.com.au  Fact Sheet for Healthcare Providers:  IncredibleEmployment.be  This test is no t yet approved or cleared by the [place] FDA and  has been authorized for detection and/or diagnosis of SARS-CoV-2 by FDA under an Emergency Use Authorization (EUA). This EUA will remain  in effect (meaning this test can be used) for the duration of the COVID-19 declaration under Section 564(b)(1) of the Act, 21 U.S.C.section 360bbb-3(b)(1), unless the authorization is terminated  or revoked sooner.  Influenza A by PCR NEGATIVE NEGATIVE Final   Influenza B by PCR NEGATIVE NEGATIVE Final    Comment: (NOTE) The Xpert Xpress SARS-CoV-2/FLU/RSV plus assay is intended as an aid in the diagnosis of influenza from Nasopharyngeal swab specimens and should not be used as a sole basis for treatment. Nasal washings and aspirates are unacceptable for Xpert Xpress SARS-CoV-2/FLU/RSV testing.  Fact Sheet for Patients: EntrepreneurPulse.com.au  Fact Sheet for Healthcare Providers: IncredibleEmployment.be  This test is not yet approved or cleared by the [place] FDA and has been authorized for detection and/or diagnosis of SARS-CoV-2 by FDA under an Emergency Use Authorization (EUA). This EUA will  remain in effect (meaning this test can be used) for the duration of the COVID-19 declaration under Section 564(b)(1) of the Act, 21 U.S.C. section 360bbb-3(b)(1), unless the authorization is terminated or revoked.  Performed at [facility], [place] [address] [place], [place] [address]          Radiology Studies: DG Chest 2 View  Result Date: 11/26/2021 CLINICAL DATA:  Fall, rule out fracture. EXAM: CHEST - 2 VIEW COMPARISON:  03/20/2012. FINDINGS: The heart is enlarged and the mediastinal contour is stable. Atherosclerotic calcification of the aorta is noted. Both lungs are clear. No acute osseous abnormality. IMPRESSION: No active cardiopulmonary disease. Electronically Signed   By: Brett Fairy M.D.   On: 11/26/2021 02:02   DG Pelvis 1-2 Views  Result Date: 11/26/2021 CLINICAL DATA:  Fall, rule out fracture. EXAM: PELVIS - 1-2 VIEW COMPARISON:  None. FINDINGS: The cortical margins of the bony pelvis are intact. No fracture. Pubic symphysis and sacroiliac joints are congruent. Bilateral hip osteoarthritis. Both femoral heads are well-seated in the respective acetabula. IMPRESSION: No pelvic fracture. Electronically Signed   By: Keith Rake M.D.   On: 11/26/2021 02:02   DG Shoulder Right  Result Date: 11/26/2021 CLINICAL DATA:  Fall, rule out fracture. EXAM: RIGHT SHOULDER - 2+ VIEW COMPARISON:  None. FINDINGS: There is no evidence of fracture or dislocation. Acromioclavicular degenerative change with spurring. Soft tissues are unremarkable. IMPRESSION: No fracture or subluxation of the right shoulder. Electronically Signed   By: Keith Rake M.D.   On: 11/26/2021 02:02   DG Forearm Left  Result Date: 11/26/2021 CLINICAL DATA:  Fall, rule out fracture. EXAM: LEFT FOREARM - 2 VIEW COMPARISON:  None. FINDINGS: The cortical margins of the radius and ulna are intact. There is no evidence of fracture or other focal bone lesions. Wrist and elbow alignment are grossly maintained.  Soft tissues are unremarkable. IMPRESSION: No fracture of the left forearm. Electronically Signed   By: Keith Rake M.D.   On: 11/26/2021 02:00   DG Ankle Complete Right  Result Date: 11/26/2021 CLINICAL DATA:  Fall, concern for fracture. EXAM: RIGHT ANKLE - COMPLETE 3+ VIEW COMPARISON:  None. FINDINGS: There is no evidence of fracture, dislocation, or joint effusion. Degenerative changes are present in the midfoot and hindfoot. Large calcaneal spur. Enthesopathic changes are noted at the insertion site of the Achilles tendon. Vascular calcifications are present in the soft tissues. Mild soft tissue swelling is present ankle lateral and anteriorly. IMPRESSION: No acute fracture or dislocation. Electronically Signed   By: Brett Fairy M.D.   On: 11/26/2021 02:01   CT Head Wo Contrast  Result Date: 11/26/2021 CLINICAL DATA:  Fall EXAM: CT HEAD WITHOUT CONTRAST CT MAXILLOFACIAL WITHOUT CONTRAST CT CERVICAL SPINE WITHOUT CONTRAST TECHNIQUE: Multidetector CT imaging of the head, cervical spine, and maxillofacial structures were performed  using the standard protocol without intravenous contrast. Multiplanar CT image reconstructions of the cervical spine and maxillofacial structures were also generated. RADIATION DOSE REDUCTION: This exam was performed according to the departmental dose-optimization program which includes automated exposure control, adjustment of the mA and/or kV according to patient size and/or use of iterative reconstruction technique. COMPARISON:  None. FINDINGS: CT HEAD FINDINGS Brain: There is no mass, hemorrhage or extra-axial collection. The size and configuration of the ventricles and extra-axial CSF spaces are normal. There is hypoattenuation of the periventricular white matter, most commonly indicating chronic ischemic microangiopathy. Vascular: Atherosclerotic calcification of the internal carotid arteries at the skull base. No abnormal hyperdensity of the major intracranial arteries  or dural venous sinuses. Skull: Large left frontal scalp hematoma.  No skull fracture. CT MAXILLOFACIAL FINDINGS Osseous: --Complex facial fracture types: No LeFort, zygomaticomaxillary complex or nasoorbitoethmoidal fracture. --Simple fracture types: None. --Mandible: No fracture or dislocation. Orbits: The globes are intact. Normal appearance of the intra- and extraconal fat. Symmetric extraocular muscles and optic nerves. Sinuses: No fluid levels or advanced mucosal thickening. Soft tissues: Normal visualized extracranial soft tissues. CT CERVICAL SPINE FINDINGS Alignment: No static subluxation. Facets are aligned. Occipital condyles and the lateral masses of C1-C2 are aligned. Skull base and vertebrae: No acute fracture. Soft tissues and spinal canal: No prevertebral fluid or swelling. No visible canal hematoma. Disc levels: No advanced spinal canal or neural foraminal stenosis. Upper chest: No pneumothorax, pulmonary nodule or pleural effusion. Other: Normal visualized paraspinal cervical soft tissues. IMPRESSION: 1. No acute intracranial abnormality. 2. Large left frontal scalp hematoma without skull fracture or facial fracture. 3. No acute fracture or static subluxation of the cervical spine. Electronically Signed   By: Ulyses Jarred M.D.   On: 11/26/2021 02:31   CT Cervical Spine Wo Contrast  Result Date: 11/26/2021 CLINICAL DATA:  Fall EXAM: CT HEAD WITHOUT CONTRAST CT MAXILLOFACIAL WITHOUT CONTRAST CT CERVICAL SPINE WITHOUT CONTRAST TECHNIQUE: Multidetector CT imaging of the head, cervical spine, and maxillofacial structures were performed using the standard protocol without intravenous contrast. Multiplanar CT image reconstructions of the cervical spine and maxillofacial structures were also generated. RADIATION DOSE REDUCTION: This exam was performed according to the departmental dose-optimization program which includes automated exposure control, adjustment of the mA and/or kV according to patient  size and/or use of iterative reconstruction technique. COMPARISON:  None. FINDINGS: CT HEAD FINDINGS Brain: There is no mass, hemorrhage or extra-axial collection. The size and configuration of the ventricles and extra-axial CSF spaces are normal. There is hypoattenuation of the periventricular white matter, most commonly indicating chronic ischemic microangiopathy. Vascular: Atherosclerotic calcification of the internal carotid arteries at the skull base. No abnormal hyperdensity of the major intracranial arteries or dural venous sinuses. Skull: Large left frontal scalp hematoma.  No skull fracture. CT MAXILLOFACIAL FINDINGS Osseous: --Complex facial fracture types: No LeFort, zygomaticomaxillary complex or nasoorbitoethmoidal fracture. --Simple fracture types: None. --Mandible: No fracture or dislocation. Orbits: The globes are intact. Normal appearance of the intra- and extraconal fat. Symmetric extraocular muscles and optic nerves. Sinuses: No fluid levels or advanced mucosal thickening. Soft tissues: Normal visualized extracranial soft tissues. CT CERVICAL SPINE FINDINGS Alignment: No static subluxation. Facets are aligned. Occipital condyles and the lateral masses of C1-C2 are aligned. Skull base and vertebrae: No acute fracture. Soft tissues and spinal canal: No prevertebral fluid or swelling. No visible canal hematoma. Disc levels: No advanced spinal canal or neural foraminal stenosis. Upper chest: No pneumothorax, pulmonary nodule or pleural effusion. Other: Normal visualized  paraspinal cervical soft tissues. IMPRESSION: 1. No acute intracranial abnormality. 2. Large left frontal scalp hematoma without skull fracture or facial fracture. 3. No acute fracture or static subluxation of the cervical spine. Electronically Signed   By: Ulyses Jarred M.D.   On: 11/26/2021 02:31   DG Knee Complete 4 Views Right  Result Date: 11/26/2021 CLINICAL DATA:  Fall, rule out fracture. EXAM: RIGHT KNEE - COMPLETE 4+ VIEW  COMPARISON:  None. FINDINGS: No acute fracture. Normal alignment, no dislocation. Chondrocalcinosis with mild peripheral spurring of the medial tibiofemoral compartment. No significant knee joint effusion. Advanced vascular calcifications. Generalized soft tissue edema. Linear soft tissue calcifications in the calf may be dystrophic. IMPRESSION: 1. No acute fracture or subluxation. 2. Generalized soft tissue edema. 3. Chondrocalcinosis and mild degenerative change. Electronically Signed   By: Keith Rake M.D.   On: 11/26/2021 02:01   CT Maxillofacial Wo Contrast  Result Date: 11/26/2021 CLINICAL DATA:  Fall EXAM: CT HEAD WITHOUT CONTRAST CT MAXILLOFACIAL WITHOUT CONTRAST CT CERVICAL SPINE WITHOUT CONTRAST TECHNIQUE: Multidetector CT imaging of the head, cervical spine, and maxillofacial structures were performed using the standard protocol without intravenous contrast. Multiplanar CT image reconstructions of the cervical spine and maxillofacial structures were also generated. RADIATION DOSE REDUCTION: This exam was performed according to the departmental dose-optimization program which includes automated exposure control, adjustment of the mA and/or kV according to patient size and/or use of iterative reconstruction technique. COMPARISON:  None. FINDINGS: CT HEAD FINDINGS Brain: There is no mass, hemorrhage or extra-axial collection. The size and configuration of the ventricles and extra-axial CSF spaces are normal. There is hypoattenuation of the periventricular white matter, most commonly indicating chronic ischemic microangiopathy. Vascular: Atherosclerotic calcification of the internal carotid arteries at the skull base. No abnormal hyperdensity of the major intracranial arteries or dural venous sinuses. Skull: Large left frontal scalp hematoma.  No skull fracture. CT MAXILLOFACIAL FINDINGS Osseous: --Complex facial fracture types: No LeFort, zygomaticomaxillary complex or nasoorbitoethmoidal fracture.  --Simple fracture types: None. --Mandible: No fracture or dislocation. Orbits: The globes are intact. Normal appearance of the intra- and extraconal fat. Symmetric extraocular muscles and optic nerves. Sinuses: No fluid levels or advanced mucosal thickening. Soft tissues: Normal visualized extracranial soft tissues. CT CERVICAL SPINE FINDINGS Alignment: No static subluxation. Facets are aligned. Occipital condyles and the lateral masses of C1-C2 are aligned. Skull base and vertebrae: No acute fracture. Soft tissues and spinal canal: No prevertebral fluid or swelling. No visible canal hematoma. Disc levels: No advanced spinal canal or neural foraminal stenosis. Upper chest: No pneumothorax, pulmonary nodule or pleural effusion. Other: Normal visualized paraspinal cervical soft tissues. IMPRESSION: 1. No acute intracranial abnormality. 2. Large left frontal scalp hematoma without skull fracture or facial fracture. 3. No acute fracture or static subluxation of the cervical spine. Electronically Signed   By: Ulyses Jarred M.D.   On: 11/26/2021 02:31        Scheduled Meds:  allopurinol  100 mg Oral Daily   atorvastatin  80 mg Oral QPM   cyclobenzaprine  10 mg Oral Daily   insulin aspart  0-9 Units Subcutaneous TID WC   insulin regular human CONCENTRATED  50-135 Units Subcutaneous See admin instructions   [START ON 11/27/2021] nebivolol  10 mg Oral BID   vitamin B-12  5,000 mcg Oral Daily   Continuous Infusions:  [START ON 11/27/2021] cefTRIAXone (ROCEPHIN)  IV     lactated ringers       LOS: 0 days    Time spent:  35 minutes. No charge    Irine Seal, MD [facility]   To contact the attending provider between 7A-7P or the covering provider during after hours 7P-7A, please log into the web site [email or web address] and access using universal  password for that web site. If you do not have the password, please call the hospital operator.  11/26/2021, 10:05 AM

## 2021-11-26 NOTE — Progress Notes (Signed)
Inpatient Diabetes Program Recommendations ? ?AACE/ADA: New Consensus Statement on Inpatient Glycemic Control (2015) ? ?Target Ranges:  Prepandial:   less than 140 mg/dL ?     Peak postprandial:   less than 180 mg/dL (1-2 hours) ?     Critically ill patients:  140 - 180 mg/dL  ? ?Lab Results  ?Component Value Date  ? GLUCAP 199 (H) 11/26/2021  ? HGBA1C 9.0 (H) 07/16/2020  ? ? ?Review of Glycemic Control ? ?Diabetes history: DM2 ?Outpatient Diabetes medications: U-500 135-90-50 TID ?Current orders for Inpatient glycemic control: U-500 135-50; Novolog 0-9 units TID ? ?Updated HgbA1C pending ?Endo - Dr Forde Dandy. Last OV 08/16/21 ? ?Inpatient Diabetes Program Recommendations:   ? ?U-500 insulin - 50% of home dose while inpatient ?U-500 65 units at breakfast, 45 units at lunch and 25 units at dinner ?Add Novolog 0-5 HS correction ? ?Will follow glucose trends. ? ?Thank you. ?Lorenda Peck, RD, LDN, CDE ?Inpatient Diabetes Coordinator ?[phone]  ? ? ? ? ? ? ?

## 2021-11-26 NOTE — ED Notes (Signed)
Phlebotomy asked to obtain blood cultures.  Sts Pt is a hard stick and was only able to get one set of cultures.  ?

## 2021-11-26 NOTE — Discharge Instructions (Signed)
Medicare Outpatient Observation Notice ?  ?Patient name:  Kristine Gray Patient number:  [id number]  ?                                                                                                                                                                     ?You?re a hospital outpatient receiving observation services. You are not an inpatient because: ? ?  ?fall syncope ?  ?You require hospital care for evaluation and/or treatment.  It is expected you will need hospital care for less than a total of two days.  ?                                                                                                                                                                     ?  ?Being an outpatient may affect what you pay in a hospital: ?  ?When you?re a hospital outpatient, your observation stay is covered under Medicare Part B. ?  ?For Part B services, you generally pay: ?  ?A copayment for each outpatient hospital service you get. Part B copayments may vary by type of service. ?  ?20% of the Medicare-approved amount for most doctor services, after the Part B deductible. ?  ?Observation services may affect coverage and payment of your care after you leave the hospital: ? ?  ? ?If you need skilled nursing facility (SNF) care after you leave the hospital, Medicare Part A will only cover SNF care if you?ve had a 3-day minimum, medically necessary, inpatient hospital stay for a related illness or injury. An inpatient hospital stay begins the day the hospital admits you as an inpatient based on a doctor?s order and doesn?t include the day you?re discharged. ?  ?If you have Medicaid, a Medicare Advantage plan or other health plan, Medicaid or the plan may have different rules for SNF coverage after you leave the hospital. Check with Medicaid or your plan. ?  ?NOTE: Medicare Part A generally doesn?t cover outpatient hospital services,  like an observation stay. However, Part A will generally cover medically  necessary inpatient services if the hospital admits you as an inpatient based on a doctor?s order. In most cases, you?ll pay a one-time deductible for all of your inpatient hospital services for the first 60 days you?re in a hospital. ?                                                                                                                                                                     ?If you have any questions about your observation services, ask the hospital staff member giving you this notice or the doctor providing your hospital care. You can also ask to speak with someone from the hospital?s utilization or discharge planning department. ?  ?You can also call 1-800-MEDICARE ([phone]).  TT[employer] users should call [phone]. ?  ?Form CMS [id number]   Expiration 09/24/2021 OMB APPROVAL [id number]  ?  ?  ? ?  ? ?Your costs for medications: ? ?  ? ?Generally, prescription and over-the-counter drugs, including ?self-administered drugs,? you get in a hospital outpatient setting (like an emergency department) aren?t covered by Part B. ?Self- administered drugs? are drugs you?d normally take on your own. For safety reasons, many hospitals don?t allow you to take medications brought from home. If you have a Medicare prescription drug plan (Part D), your plan may help you pay for these drugs. You?ll likely need to pay out-of- pocket for these drugs and submit a claim to your drug plan for a refund. Contact your drug plan for more information. ?  ?                                                                                                                                                                     ?If you?re enrolled in a Medicare Advantage plan (like an HMO or PPO) or other Medicare health plan (Part C), your costs and coverage may be different. Check with your plan to find out about coverage  for outpatient observation services. ? ?  ?If you?re a Chief of Staff through your  state Medicaid program, you can?t be billed for Part A or Part B deductibles, coinsurance, and copayments. ? ?                                                                                                                                                                    ?Additional Information (Optional): ?  ?  ?  ?  ?  ?                                                                                                                                                                     ?Please sign below to show you received and understand this notice. ? ?  ? ?                                    Date: 11/26/21 / Time:1:13 PM ?  ?CMS does not discriminate in its programs and activities. To request this publication in alternative format, please call: 1-800-MEDICARE or email:[email or web address]. ?  ?Form CMS [id number]   Expiration 09/24/2021 OMB APPROVAL [id number]  ?  ? ? ?Patient ? ? ?Add ?No image attached ?Trace ?Slow ?Corrupt ?Edit Data ?Change Template ?Print ?On  ? ?

## 2021-11-26 NOTE — Progress Notes (Signed)
Physical Therapy Evaluation ?Patient Details ?Name: Kristine Gray ?MRN: [id number] ?DOB: 03/21/1951 ?Today's Date: 11/26/2021 ? ?History of Present Illness ? 71 yo female with onset of dizziness and near syncope was noted to have previously had a  fall at home, returned home yesterday from ED.  Now brought to hosp on 3/4, after recent fall was cleared for head and neck fractures but UTI suspected.  Has nephrostomy site, L flank.  Current concern after striking head of concussion, with mult episodes of N&V and husband having recorded low BP at home.  PMHx:  Falls, TIA, DM, HTN, CKD3, low BP  ?Clinical Impression ? Pt was seen for first attempts to move, and noted both nausea and light headed feelings to sit up.  Pt had BP of 133/66 sitting, sats were WFL and HR 80.  Pt is in pain on RLE to move with point tenderness on the posterior talofib ligament on R ankle.  Does not tolerate wb on RLE but also too nauseated to stand.  Follow along with her for rehab as goals are written, and did talk with her about concerns for having to climb a flight to the second floor in her house to get to a bed and bath.  Pt states she cannot manage on the first floor in her home.  Pt is concerned about the pain on RLE, and so recommending to go to rehab setting with SNF before home.  Pt is likely to decline this, wishing instead to return home with husband.  Frequency of therapy is set to manage her likely choice, but will downgrade if she elects to pursue rehab.     ?   ? ?Recommendations for follow up therapy are one component of a multi-disciplinary discharge planning process, led by the attending physician.  Recommendations may be updated based on patient status, additional functional criteria and insurance authorization. ? ?Follow Up Recommendations Skilled nursing-short term rehab (<3 hours/day) ? ?  ?Assistance Recommended at Discharge Frequent or constant Supervision/Assistance  ?Patient can return home with the following ? A lot  of help with walking and/or transfers;A little help with bathing/dressing/bathroom;Assistance with cooking/housework;Assist for transportation;Help with stairs or ramp for entrance ? ?  ?Equipment Recommendations Wheelchair (measurements PT);Wheelchair cushion (measurements PT)  ?Recommendations for Other Services ?    ?  ?Functional Status Assessment Patient has had a recent decline in their functional status and demonstrates the ability to make significant improvements in function in a reasonable and predictable amount of time.  ? ?  ?Precautions / Restrictions Precautions ?Precautions: Fall ?Precaution Comments: monitor for concussion symptoms, BP ?Restrictions ?Weight Bearing Restrictions: No  ? ?  ? ?Mobility ? Bed Mobility ?Overal bed mobility: Needs Assistance ?Bed Mobility: Sidelying to Sit, Sit to Sidelying, Rolling ?Rolling: Min guard ?Sidelying to sit: Mod assist ?  ?  ?Sit to sidelying: Mod assist ?General bed mobility comments: mod to support trunk up from bed and mod to return to bed due to pain and weakness on RLE ?  ? ?Transfers ?  ?  ?  ?  ?  ?  ?  ?  ?  ?General transfer comment: pt is unable to attempt standing, dizzy and nauseated ?  ? ?Ambulation/Gait ?  ?  ?  ?  ?  ?  ?  ?General Gait Details: unable to stand up ? ?Stairs ?  ?  ?  ?  ?  ? ?Wheelchair Mobility ?  ? ?Modified Rankin (Stroke Patients Only) ?  ? ?  ? ?  Balance Overall balance assessment: Needs assistance, History of Falls ?Sitting-balance support: Feet supported, Bilateral upper extremity supported ?Sitting balance-Leahy Scale: Fair ?  ?  ?  ?  ?  ?  ?  ?  ?  ?  ?  ?  ?  ?  ?  ?  ?   ? ? ? ?Pertinent Vitals/Pain Pain Assessment ?Pain Assessment: Faces ?Faces Pain Scale: Hurts a little bit ?Pain Location: head from fall and neck from jolting chronic pain ?Pain Descriptors / Indicators: Guarding, Grimacing ?Pain Intervention(s): Limited activity within patient's tolerance, Monitored during session, Repositioned, Premedicated before  session  ? ? ?Home Living Family/patient expects to be discharged to:: Private residence ?Living Arrangements: Spouse/significant other ?Available Help at Discharge: Family;Available 24 hours/day ?Type of Home: House ?Home Access: Stairs to enter ?Entrance Stairs-Rails: Right;Left ?Entrance Stairs-Number of Steps: 2 ?Alternate Level Stairs-Number of Steps: 13 ?Home Layout: Multi-level ?Home Equipment: Cane - single point;Grab bars - toilet;Grab bars - tub/shower ?Additional Comments: pt reports she has been using hurry cane more recently but cannot stand on joints on R knee and ankle  ?  ?Prior Function Prior Level of Function : Independent/Modified Independent ?  ?  ?  ?  ?  ?  ?Mobility Comments: used SPC with no issues ?ADLs Comments: I with self care ?  ? ? ?Hand Dominance  ? Dominant Hand: Right ? ?  ?Extremity/Trunk Assessment  ? Upper Extremity Assessment ?Upper Extremity Assessment: Overall WFL for tasks assessed ?  ? ?Lower Extremity Assessment ?Lower Extremity Assessment: RLE deficits/detail ?RLE Deficits / Details: pain to WB on R knee and ankle, with pain on lateral ligaments of R ankle esp post talofibular ligament ?  ? ?Cervical / Trunk Assessment ?Cervical / Trunk Assessment: Kyphotic (mild)  ?Communication  ? Communication: No difficulties  ?Cognition Arousal/Alertness: Awake/alert ?Behavior During Therapy: [facility] for tasks assessed/performed ?Overall Cognitive Status: Within Functional Limits for tasks assessed ?  ?  ?  ?  ?  ?  ?  ?  ?  ?  ?  ?  ?  ?  ?  ?  ?General Comments: able to give history details ?  ?  ? ?  ?General Comments General comments (skin integrity, edema, etc.): pt is up to side of bed with limited tolerance for movement both from pain on head and neck as well as nausea.  Pt is suspected of concussion and will need to follow up with her in this concern ? ?  ?Exercises    ? ?Assessment/Plan  ?  ?PT Assessment Patient needs continued PT services  ?PT Problem List Decreased  strength;Decreased range of motion;Decreased activity tolerance;Decreased balance;Decreased mobility;Decreased skin integrity;Pain ? ?   ?  ?PT Treatment Interventions DME instruction;Gait training;Stair training;Functional mobility training;Therapeutic activities;Therapeutic exercise;Balance training;Neuromuscular re-education;Patient/family education   ? ?PT Goals (Current goals can be found in the Care Plan section)  ?Acute Rehab PT Goals ?Patient Stated Goal: to go home with husband ASAP ?PT Goal Formulation: With patient ?Time For Goal Achievement: 12/10/21 ?Potential to Achieve Goals: Good ? ?  ?Frequency Min 3X/week ?  ? ? ?Co-evaluation   ?  ?  ?  ?  ? ? ?  ?AM-PAC PT "6 Clicks" Mobility  ?Outcome Measure Help needed turning from your back to your side while in a flat bed without using bedrails?: A Little ?Help needed moving from lying on your back to sitting on the side of a flat bed without using bedrails?: A Little ?Help needed moving  to and from a bed to a chair (including a wheelchair)?: A Little ?Help needed standing up from a chair using your arms (e.g., wheelchair or bedside chair)?: A Little ?Help needed to walk in hospital room?: Total ?Help needed climbing 3-5 steps with a railing? : Total ?6 Click Score: 14 ? ?  ?End of Session   ?Activity Tolerance: Patient limited by fatigue;Patient limited by pain;Treatment limited secondary to medical complications (Comment) ?Patient left: in bed;with call bell/phone within reach ?Nurse Communication: Mobility status;Other (comment) (BP sitting WFL but nauseated and light headed) ?PT Visit Diagnosis: Muscle weakness (generalized) (M62.81);Pain;History of falling (Z91.81);Difficulty in walking, not elsewhere classified (R26.2) ?Pain - Right/Left: Right ?Pain - part of body: Knee;Leg;Ankle and joints of foot ?  ? ?Time: 9417-4081 ?PT Time Calculation (min) (ACUTE ONLY): 27 min ? ? ?Charges:   PT Evaluation ?$PT Eval Moderate Complexity: 1 Mod ?PT  Treatments ?$Therapeutic Activity: 8-22 mins ?  ?   ? ?Ramond Dial ?11/26/2021, 1:21 PM ? ?Mee Hives, PT PhD ?Acute Rehab Dept. Number: [facility] [phone] and [place] [phone] ? ?

## 2021-11-26 NOTE — ED Provider Notes (Signed)
[facility] EMERGENCY DEPARTMENT Provider Note  CSN: [id number] Arrival date & time: 11/26/21 0036  Chief Complaint(s) Fall  HPI Kristine Gray is a 71 y.o. female who presents emergency department for evaluation of a fall and presyncope.  Patient states that 4 days ago she had an episode of presyncope after using the bathroom and fell forward into the wall striking her head and face against the wall.  She is on Xarelto and had resultant periorbital hematoma formation as well as a large scalp hematoma.  Patient states that she did not present to the emergency department because her husband was frequently checking on her and he works in the ICU.  Today, the patient had another episode of presyncope prompting evaluation in the emergency department.  Patient states that after her initial head trauma 4 days ago she had persistent nausea and vomiting but this is since improved.  She states that she feels unsteady on her feet and that her legs are going to give out on her.  Denies chest pain, shortness of breath, abdominal pain or other systemic symptoms.   Fall   Past Medical History Past Medical History:  Diagnosis Date   Anemia    Arthritis 04-02-12   arthritis lower back and spurs   Carotid artery occlusion    Diabetes mellitus 04-02-12   oral med and insulin used   Diverticulosis of colon 04-02-12   hx. of, no problems   H/O nephrostomy 04-02-12   03-19-12 tube placed and remains lt. flank.   Hypertension    Kidney calculi 04-02-12   hx. kidney stones-left, past hx. kidney stones   Obesity    Patient Active Problem List   Diagnosis Date Noted   TIA (transient ischemic attack) 07/14/2020   Pyelonephritis 03/19/2012   DM2 (diabetes mellitus, type 2) ([place]) 03/19/2012   HTN (hypertension) 03/19/2012   Hyperlipidemia 03/19/2012   Renal failure 03/19/2012   Sepsis(995.91) 03/19/2012   Home Medication(s) Prior to Admission medications   Medication Sig Start Date End  Date Taking? Authorizing Provider  allopurinol (ZYLOPRIM) 100 MG tablet TAKE 1 TABLET BY MOUTH ONCE DAILY 03/02/21     atorvastatin (LIPITOR) 80 MG tablet Take 1 tablet (80 mg total) by mouth daily. 08/10/21 08/10/22  Waynetta Sandy, MD  cephALEXin (KEFLEX) 500 MG capsule Take 1 capsule (500 mg total) by mouth every 6 (six) hours until gone. 11/08/21     chlorhexidine (PERIDEX) 0.12 % solution RINSE MOUTH WITH 15ML (1 CAPFUL) FOR 30 SECONDS IN THE MORNING AND EVENING AFTER BRUSHING. SPIT AFTER RINSING, DO NOT SWALLOW. 11/08/21     clopidogrel (PLAVIX) 75 MG tablet Take 1 tablet (75 mg total) by mouth daily. 08/10/21 08/10/22  Waynetta Sandy, MD  colchicine 0.6 MG tablet take 1 tablet by mouth daily as needed 01/26/21     Continuous Blood Gluc Sensor (FREESTYLE LIBRE 2 SENSOR) MISC use to check blood sugar and change every 14 days 01/04/21   Reynold Bowen, MD  cyclobenzaprine (FLEXERIL) 10 MG tablet TAKE 1 TABLET BY MOUTH ONCE DAILY 08/29/21     fluconazole (DIFLUCAN) 150 MG tablet TAKE 1 TABLET BY MOUTH ONCE. MAY REPEAT IN 2 DAYS. 02/04/21     gabapentin (NEURONTIN) 300 MG capsule TAKE 1 CAPSULE BY MOUTH ONCE DAILY 06/28/20 06/28/21  Reynold Bowen, MD  gabapentin (NEURONTIN) 300 MG capsule TAKE 1 CAPSULE BY MOUTH ONCE DAILY 08/29/21     hydrochlorothiazide (HYDRODIURIL) 25 MG tablet take 1 tablet by mouth every morning.  05/09/21     Insulin Pen Needle 31G X 5 MM MISC Use as directed 3 times a day 11/16/21     insulin regular human CONCENTRATED (HUMULIN R U-500 KWIKPEN) 500 UNIT/ML KwikPen Inject 135 units under the skin every morning, inject 90 units at lunch time and inject 50 units with dinner as directed 03/22/21     nebivolol (BYSTOLIC) 10 MG tablet take 1 tablet by mouth twice a day. 05/09/21     polyvinyl alcohol (LIQUIFILM TEARS) 1.4 % ophthalmic solution Place 1 drop into both eyes as needed for dry eyes.    [provider]                                                                                                                                     Past Surgical History Past Surgical History:  Procedure Laterality Date   CATARACT EXTRACTION, BILATERAL  04/2021   CESAREAN SECTION  04/02/2012   '86   CHOLECYSTECTOMY     DILATION AND CURETTAGE OF UTERUS  04/02/2012   ENDARTERECTOMY Left 07/16/2020   Procedure: ENDARTERECTOMY CAROTID;  Surgeon: Rosetta Posner, MD;  Location: [facility] OR;  Service: Vascular;  Laterality: Left;   iriodotomy  04/02/2012   bil. eyes   NEPHROLITHOTOMY  04/09/2012   Procedure: NEPHROLITHOTOMY PERCUTANEOUS;  Surgeon: Fredricka Bonine, MD;  Location: [facility] ORS;  Service: Urology;  Laterality: Left;   TONSILLECTOMY     TUBAL LIGATION  04/02/2012   '86   Family History Family History  Problem Relation Age of Onset   Cancer Other    Hypertension Other    Hyperlipidemia Other    Obesity Other    Heart attack Other    Breast cancer Maternal Aunt        in mid 13's    Social History Social History   Tobacco Use   Smoking status: Never   Smokeless tobacco: Never  Vaping Use   Vaping Use: Never used  Substance Use Topics   Alcohol use: No   Drug use: No   Allergies Penicillins, Clindamycin/lincomycin, Dapagliflozin, and Valsartan  Review of Systems Review of Systems  Neurological:  Positive for syncope.   Physical Exam Vital Signs  I have reviewed the triage vital signs BP 104/85    Pulse 70    Temp 97.9 F (36.6 C) (Oral)    Resp 12    Ht '5\' 4"'$  (1.626 m)    Wt 122.9 kg    SpO2 94%    BMI 46.52 kg/m   Physical Exam Vitals and nursing note reviewed.  Constitutional:      General: She is not in acute distress.    Appearance: She is well-developed.  HENT:     Head: Normocephalic.     Comments: Large scalp hematoma to the left frontal bone, left periorbital bruising Eyes:     Conjunctiva/sclera: Conjunctivae normal.  Cardiovascular:  Rate and Rhythm: Normal rate and regular rhythm.     Heart sounds: No  murmur heard. Pulmonary:     Effort: Pulmonary effort is normal. No respiratory distress.     Breath sounds: Normal breath sounds.  Abdominal:     Palpations: Abdomen is soft.     Tenderness: There is no abdominal tenderness.  Musculoskeletal:        General: No swelling.     Cervical back: Neck supple.     Comments: Right shoulder, right knee right ankle tenderness  Skin:    General: Skin is warm and dry.     Capillary Refill: Capillary refill takes less than 2 seconds.     Findings: Bruising (Left forearm) present.  Neurological:     Mental Status: She is alert.  Psychiatric:        Mood and Affect: Mood normal.    ED Results and Treatments Labs (all labs ordered are listed, but only abnormal results are displayed) Labs Reviewed  CBG MONITORING, ED - Abnormal; Notable for the following components:      Result Value   Glucose-Capillary 236 (*)    All other components within normal limits  COMPREHENSIVE METABOLIC PANEL  CBC WITH DIFFERENTIAL/PLATELET  URINALYSIS, ROUTINE W REFLEX MICROSCOPIC  TROPONIN I (HIGH SENSITIVITY)                                                                                                                          Radiology No results found.  Pertinent labs & imaging results that were available during my care of the patient were reviewed by me and considered in my medical decision making (see MDM for details).  Medications Ordered in ED Medications  lactated ringers bolus 1,000 mL (has no administration in time range)                                                                                                                                     Procedures Procedures  (including critical care time)  Medical Decision Making / ED Course   This patient presents to the ED for concern of presyncope, head trauma, this involves an extensive number of treatment options, and is a complaint that carries with it a high risk of complications and  morbidity.  The differential diagnosis includes hematoma, ICH, UTI, electrolyte abnormality, dehydration  MDM: Patient seen emergency department for evaluation of multiple  complaints as described above.  Physical exam reveals a left frontal hematoma as well as periorbital bruising on the left, tenderness to the right shoulder, right knee right ankle left forearm.  Laboratory valuation with hyponatremia 130, glucose 258, creatinine 1.88 which is a mild elevation for this patient.  Leukocytosis to 13.0.  Urinalysis concerning for cystitis with large leuk esterase, greater than 50 white blood cells.  Patient started on ceftriaxone as she has tolerated Keflex in the past without difficulty.  Patient then admitted for presyncope causing head trauma in the setting of UTI.   Additional history obtained:  -External records from outside source obtained and reviewed including: Chart review including previous notes, labs, imaging, consultation notes   Lab Tests: -I ordered, reviewed, and interpreted labs.   The pertinent results include:   Labs Reviewed  CBG MONITORING, ED - Abnormal; Notable for the following components:      Result Value   Glucose-Capillary 236 (*)    All other components within normal limits  COMPREHENSIVE METABOLIC PANEL  CBC WITH DIFFERENTIAL/PLATELET  URINALYSIS, ROUTINE W REFLEX MICROSCOPIC  TROPONIN I (HIGH SENSITIVITY)      EKG   EKG Interpretation  Date/Time:  Saturday November 26 2021 00:47:57 EST Ventricular Rate:  72 PR Interval:  143 QRS Duration: 97 QT Interval:  418 QTC Calculation: 458 R Axis:   32 Text Interpretation: Sinus rhythm Borderline T wave abnormalities Confirmed by [place] ([id number]) on 11/26/2021 7:16:09 AM         Imaging Studies ordered: I ordered imaging studies including CTH, CT face, multiple XRs I independently visualized and interpreted imaging. I agree with the radiologist interpretation   Medicines ordered and prescription  drug management: Meds ordered this encounter  Medications   lactated ringers bolus 1,000 mL    -I have reviewed the patients home medicines and have made adjustments as needed  Critical interventions none  Cardiac Monitoring: The patient was maintained on a cardiac monitor.  I personally viewed and interpreted the cardiac monitored which showed an underlying rhythm of: NSR  Social Determinants of Health:  Factors impacting patients care include: none   Reevaluation: After the interventions noted above, I reevaluated the patient and found that they have :improved  Co morbidities that complicate the patient evaluation  Past Medical History:  Diagnosis Date   Anemia    Arthritis 04-02-12   arthritis lower back and spurs   Carotid artery occlusion    Diabetes mellitus 04-02-12   oral med and insulin used   Diverticulosis of colon 04-02-12   hx. of, no problems   H/O nephrostomy 04-02-12   03-19-12 tube placed and remains lt. flank.   Hypertension    Kidney calculi 04-02-12   hx. kidney stones-left, past hx. kidney stones   Obesity       Dispostion: I considered admission for this patient, and due to multiple falls causing head trauma on a blood thinner with a current UTI she will be admitted.     Final Clinical Impression(s) / ED Diagnoses Final diagnoses:  None     '@PCDICTATION'$ @    Teressa Lower, MD 11/26/21 [phone]

## 2021-11-26 NOTE — ED Triage Notes (Signed)
Pt bib [facility] for a near syncopal episode happening tonight and husband found her bp to be 47'U sytolic. Thursday pt did have syncopal episode and hit her head. After fall she was having n/v. On eliqus - last does Wednesday.  ? ?BP 138/48, Spo2: 98%  ?

## 2021-11-26 NOTE — H&P (Signed)
History and Physical    Kristine Gray QQI:[id number] DOB: 05/26/1951 DOA: 11/26/2021  PCP: Bernerd Limbo, MD  Patient coming from: Home.  Chief Complaint: Low blood pressure.  HPI: Kristine Gray is a 71 y.o. female with history of TIA, diabetes mellitus type 2, hypertension, chronic kidney disease stage III was brought to the ER after patient was finding it difficult to get up from the commode.  Patient husband checked her blood pressure was found it to be in systolic 94R.  EMS was called and patient was brought to the ER.  A day prior to this patient did have a fall and hit her face and sustained bruises on her scalp and around her eyes.  At that time patient decided to come to the ER.  Denies any chest pain or shortness of breath.  Since patient's fall and hurting her head has been had at least 4 episodes of vomiting.  ED Course: In the ER patient initial blood pressure was in the 740 systolic which improved with fluid bolus.  At the time of my exam blood pressure systolic in the 814G.  UA is concerning for UTI.  CT scan of the head and maxillofacial done shows large scalp hematoma.  On exam patient also has periorbital hematoma.  X-rays do not show any fracture.  UA shows features concerning for UTI.  EKG shows normal sinus rhythm.  Patient admitted for further work-up and management.  COVID test is pending.  Review of Systems: As per HPI, rest all negative.   Past Medical History:  Diagnosis Date   Anemia    Arthritis 04-02-12   arthritis lower back and spurs   Carotid artery occlusion    Diabetes mellitus 04-02-12   oral med and insulin used   Diverticulosis of colon 04-02-12   hx. of, no problems   H/O nephrostomy 04-02-12   03-19-12 tube placed and remains lt. flank.   Hypertension    Kidney calculi 04-02-12   hx. kidney stones-left, past hx. kidney stones   Obesity     Past Surgical History:  Procedure Laterality Date   CATARACT EXTRACTION, BILATERAL  04/2021   CESAREAN  SECTION  04/02/2012   '86   CHOLECYSTECTOMY     DILATION AND CURETTAGE OF UTERUS  04/02/2012   ENDARTERECTOMY Left 07/16/2020   Procedure: ENDARTERECTOMY CAROTID;  Surgeon: Rosetta Posner, MD;  Location: [facility] OR;  Service: Vascular;  Laterality: Left;   iriodotomy  04/02/2012   bil. eyes   NEPHROLITHOTOMY  04/09/2012   Procedure: NEPHROLITHOTOMY PERCUTANEOUS;  Surgeon: Fredricka Bonine, MD;  Location: [facility] ORS;  Service: Urology;  Laterality: Left;   TONSILLECTOMY     TUBAL LIGATION  04/02/2012   '86     reports that she has never smoked. She has never used smokeless tobacco. She reports that she does not drink alcohol and does not use drugs.  Allergies  Allergen Reactions   Penicillins Hives, Itching and Swelling   Clindamycin/Lincomycin     Other reaction(s): Unknown Trouble breathing   Dapagliflozin     Other reaction(s): Dizziness   Valsartan Other (See Comments)    Bradycardia    Family History  Problem Relation Age of Onset   Cancer Other    Hypertension Other    Hyperlipidemia Other    Obesity Other    Heart attack Other    Breast cancer Maternal Aunt        in mid 60's    Prior to Admission medications  Medication Sig Start Date End Date Taking? Authorizing Provider  acetaminophen (TYLENOL) 650 MG CR tablet Take 650 mg by mouth every 8 (eight) hours as needed for pain.   Yes [provider]  allopurinol (ZYLOPRIM) 100 MG tablet TAKE 1 TABLET BY MOUTH ONCE DAILY 03/02/21  Yes   atorvastatin (LIPITOR) 80 MG tablet Take 1 tablet (80 mg total) by mouth daily. Patient taking differently: Take 80 mg by mouth every evening. 08/10/21 08/10/22 Yes Waynetta Sandy, MD  Cholecalciferol (NATURAL VITAMIN D-3) 125 MCG (5000 UT) TABS Take 5,000 Units by mouth daily.   Yes [provider]  clopidogrel (PLAVIX) 75 MG tablet Take 1 tablet (75 mg total) by mouth daily. Patient taking differently: Take 75 mg by mouth every evening. 08/10/21 08/10/22  Yes Waynetta Sandy, MD  colchicine 0.6 MG tablet take 1 tablet by mouth daily as needed Patient taking differently: Take 0.6 mg by mouth daily as needed (gout flare). 01/26/21  Yes   Continuous Blood Gluc Sensor (FREESTYLE LIBRE 2 SENSOR) MISC use to check blood sugar and change every 14 days 01/04/21  Yes Forde Dandy, Annie Main, MD  Cyanocobalamin (VITAMIN B-12) 5000 MCG TBDP Take 5,000 Units by mouth daily.   Yes [provider]  cyclobenzaprine (FLEXERIL) 10 MG tablet TAKE 1 TABLET BY MOUTH ONCE DAILY 08/29/21  Yes   gabapentin (NEURONTIN) 300 MG capsule TAKE 1 CAPSULE BY MOUTH ONCE DAILY Patient taking differently: Take 300 mg by mouth daily as needed (pain). 08/29/21  Yes   hydrochlorothiazide (HYDRODIURIL) 25 MG tablet take 1 tablet by mouth every morning. Patient taking differently: Take 25 mg by mouth daily. 05/09/21  Yes   Insulin Pen Needle 31G X 5 MM MISC Use as directed 3 times a day 11/16/21  Yes   insulin regular human CONCENTRATED (HUMULIN R U-500 KWIKPEN) 500 UNIT/ML KwikPen Inject 135 units under the skin every morning, inject 90 units at lunch time and inject 50 units with dinner as directed Patient taking differently: Inject 50-135 Units into the skin See admin instructions. 135 units in the morning 90 units at lunch 50 units with dinner 03/22/21  Yes   nebivolol (BYSTOLIC) 10 MG tablet take 1 tablet by mouth twice a day. Patient taking differently: Take 10 mg by mouth in the morning and at bedtime. 05/09/21  Yes   vitamin E 180 MG (400 UNITS) capsule Take 400 Units by mouth daily.   Yes [provider]  fluconazole (DIFLUCAN) 150 MG tablet TAKE 1 TABLET BY MOUTH ONCE. MAY REPEAT IN 2 DAYS. Patient not taking: Reported on 11/26/2021 02/04/21       Physical Exam: Constitutional: Moderately built and nourished. Vitals:   11/26/21 0047 11/26/21 0048 11/26/21 0048 11/26/21 0232  BP:   104/85 (!) 174/72  Pulse:  70  82  Resp:  12  20  Temp: 97.9 F (36.6 C)      TempSrc: Oral     SpO2:  94%  99%  Weight:      Height:       Eyes: Anicteric no pallor. ENMT: No discharge from the ears eyes nose and mouth. Neck: No mass felt.  No neck rigidity. Respiratory: No rhonchi or crepitations. Cardiovascular: S1-S2 heard. Abdomen: Soft nontender bowel sound present. Musculoskeletal: Scalp hematoma. Skin: Scalp hematoma and bruise around both eyes. Neurologic: Alert awake oriented to time place and person.  Moves all extremities. Psychiatric: Appears normal.  Normal affect.   Labs on Admission: I have personally reviewed following  labs and imaging studies  CBC: Recent Labs  Lab 11/26/21 0057  WBC 12.8*  NEUTROABS 8.5*  HGB 13.9  HCT 40.1  MCV 91.3  PLT 604   Basic Metabolic Panel: Recent Labs  Lab 11/26/21 0057  NA 130*  K 4.2  CL 90*  CO2 26  GLUCOSE 258*  BUN 17  CREATININE 1.88*  CALCIUM 9.0   GFR: Estimated Creatinine Clearance: 35.5 mL/min (A) (by C-G formula based on SCr of 1.88 mg/dL (H)). Liver Function Tests: Recent Labs  Lab 11/26/21 0057  AST 25  ALT 21  ALKPHOS 70  BILITOT 1.4*  PROT 6.6  ALBUMIN 3.6   No results for input(s): LIPASE, AMYLASE in the last 168 hours. No results for input(s): AMMONIA in the last 168 hours. Coagulation Profile: No results for input(s): INR, PROTIME in the last 168 hours. Cardiac Enzymes: No results for input(s): CKTOTAL, CKMB, CKMBINDEX, TROPONINI in the last 168 hours. BNP (last 3 results) No results for input(s): PROBNP in the last 8760 hours. HbA1C: No results for input(s): HGBA1C in the last 72 hours. CBG: Recent Labs  Lab 11/26/21 0050  GLUCAP 236*   Lipid Profile: No results for input(s): CHOL, HDL, LDLCALC, TRIG, CHOLHDL, LDLDIRECT in the last 72 hours. Thyroid Function Tests: No results for input(s): TSH, T4TOTAL, FREET4, T3FREE, THYROIDAB in the last 72 hours. Anemia Panel: No results for input(s): VITAMINB12, FOLATE, FERRITIN, TIBC, IRON, RETICCTPCT in the  last 72 hours. Urine analysis:    Component Value Date/Time   COLORURINE YELLOW 11/26/2021 0239   APPEARANCEUR HAZY (A) 11/26/2021 0239   LABSPEC 1.008 11/26/2021 0239   PHURINE 6.0 11/26/2021 0239   GLUCOSEU >=500 (A) 11/26/2021 0239   HGBUR SMALL (A) 11/26/2021 0239   BILIRUBINUR NEGATIVE 11/26/2021 0239   KETONESUR 5 (A) 11/26/2021 0239   PROTEINUR 30 (A) 11/26/2021 0239   UROBILINOGEN 0.2 03/18/2012 2039   NITRITE NEGATIVE 11/26/2021 0239   LEUKOCYTESUR LARGE (A) 11/26/2021 0239   Sepsis Labs: '@LABRCNTIP'$ (procalcitonin:4,lacticidven:4) )No results found for this or any previous visit (from the past 240 hour(s)).   Radiological Exams on Admission: DG Chest 2 View  Result Date: 11/26/2021 CLINICAL DATA:  Fall, rule out fracture. EXAM: CHEST - 2 VIEW COMPARISON:  03/20/2012. FINDINGS: The heart is enlarged and the mediastinal contour is stable. Atherosclerotic calcification of the aorta is noted. Both lungs are clear. No acute osseous abnormality. IMPRESSION: No active cardiopulmonary disease. Electronically Signed   By: Brett Fairy M.D.   On: 11/26/2021 02:02   DG Pelvis 1-2 Views  Result Date: 11/26/2021 CLINICAL DATA:  Fall, rule out fracture. EXAM: PELVIS - 1-2 VIEW COMPARISON:  None. FINDINGS: The cortical margins of the bony pelvis are intact. No fracture. Pubic symphysis and sacroiliac joints are congruent. Bilateral hip osteoarthritis. Both femoral heads are well-seated in the respective acetabula. IMPRESSION: No pelvic fracture. Electronically Signed   By: Keith Rake M.D.   On: 11/26/2021 02:02   DG Shoulder Right  Result Date: 11/26/2021 CLINICAL DATA:  Fall, rule out fracture. EXAM: RIGHT SHOULDER - 2+ VIEW COMPARISON:  None. FINDINGS: There is no evidence of fracture or dislocation. Acromioclavicular degenerative change with spurring. Soft tissues are unremarkable. IMPRESSION: No fracture or subluxation of the right shoulder. Electronically Signed   By: Keith Rake M.D.   On: 11/26/2021 02:02   DG Forearm Left  Result Date: 11/26/2021 CLINICAL DATA:  Fall, rule out fracture. EXAM: LEFT FOREARM - 2 VIEW COMPARISON:  None. FINDINGS: The cortical margins  of the radius and ulna are intact. There is no evidence of fracture or other focal bone lesions. Wrist and elbow alignment are grossly maintained. Soft tissues are unremarkable. IMPRESSION: No fracture of the left forearm. Electronically Signed   By: Keith Rake M.D.   On: 11/26/2021 02:00   DG Ankle Complete Right  Result Date: 11/26/2021 CLINICAL DATA:  Fall, concern for fracture. EXAM: RIGHT ANKLE - COMPLETE 3+ VIEW COMPARISON:  None. FINDINGS: There is no evidence of fracture, dislocation, or joint effusion. Degenerative changes are present in the midfoot and hindfoot. Large calcaneal spur. Enthesopathic changes are noted at the insertion site of the Achilles tendon. Vascular calcifications are present in the soft tissues. Mild soft tissue swelling is present ankle lateral and anteriorly. IMPRESSION: No acute fracture or dislocation. Electronically Signed   By: Brett Fairy M.D.   On: 11/26/2021 02:01   CT Head Wo Contrast  Result Date: 11/26/2021 CLINICAL DATA:  Fall EXAM: CT HEAD WITHOUT CONTRAST CT MAXILLOFACIAL WITHOUT CONTRAST CT CERVICAL SPINE WITHOUT CONTRAST TECHNIQUE: Multidetector CT imaging of the head, cervical spine, and maxillofacial structures were performed using the standard protocol without intravenous contrast. Multiplanar CT image reconstructions of the cervical spine and maxillofacial structures were also generated. RADIATION DOSE REDUCTION: This exam was performed according to the departmental dose-optimization program which includes automated exposure control, adjustment of the mA and/or kV according to patient size and/or use of iterative reconstruction technique. COMPARISON:  None. FINDINGS: CT HEAD FINDINGS Brain: There is no mass, hemorrhage or extra-axial collection. The size  and configuration of the ventricles and extra-axial CSF spaces are normal. There is hypoattenuation of the periventricular white matter, most commonly indicating chronic ischemic microangiopathy. Vascular: Atherosclerotic calcification of the internal carotid arteries at the skull base. No abnormal hyperdensity of the major intracranial arteries or dural venous sinuses. Skull: Large left frontal scalp hematoma.  No skull fracture. CT MAXILLOFACIAL FINDINGS Osseous: --Complex facial fracture types: No LeFort, zygomaticomaxillary complex or nasoorbitoethmoidal fracture. --Simple fracture types: None. --Mandible: No fracture or dislocation. Orbits: The globes are intact. Normal appearance of the intra- and extraconal fat. Symmetric extraocular muscles and optic nerves. Sinuses: No fluid levels or advanced mucosal thickening. Soft tissues: Normal visualized extracranial soft tissues. CT CERVICAL SPINE FINDINGS Alignment: No static subluxation. Facets are aligned. Occipital condyles and the lateral masses of C1-C2 are aligned. Skull base and vertebrae: No acute fracture. Soft tissues and spinal canal: No prevertebral fluid or swelling. No visible canal hematoma. Disc levels: No advanced spinal canal or neural foraminal stenosis. Upper chest: No pneumothorax, pulmonary nodule or pleural effusion. Other: Normal visualized paraspinal cervical soft tissues. IMPRESSION: 1. No acute intracranial abnormality. 2. Large left frontal scalp hematoma without skull fracture or facial fracture. 3. No acute fracture or static subluxation of the cervical spine. Electronically Signed   By: Ulyses Jarred M.D.   On: 11/26/2021 02:31   CT Cervical Spine Wo Contrast  Result Date: 11/26/2021 CLINICAL DATA:  Fall EXAM: CT HEAD WITHOUT CONTRAST CT MAXILLOFACIAL WITHOUT CONTRAST CT CERVICAL SPINE WITHOUT CONTRAST TECHNIQUE: Multidetector CT imaging of the head, cervical spine, and maxillofacial structures were performed using the standard  protocol without intravenous contrast. Multiplanar CT image reconstructions of the cervical spine and maxillofacial structures were also generated. RADIATION DOSE REDUCTION: This exam was performed according to the departmental dose-optimization program which includes automated exposure control, adjustment of the mA and/or kV according to patient size and/or use of iterative reconstruction technique. COMPARISON:  None. FINDINGS: CT HEAD FINDINGS  Brain: There is no mass, hemorrhage or extra-axial collection. The size and configuration of the ventricles and extra-axial CSF spaces are normal. There is hypoattenuation of the periventricular white matter, most commonly indicating chronic ischemic microangiopathy. Vascular: Atherosclerotic calcification of the internal carotid arteries at the skull base. No abnormal hyperdensity of the major intracranial arteries or dural venous sinuses. Skull: Large left frontal scalp hematoma.  No skull fracture. CT MAXILLOFACIAL FINDINGS Osseous: --Complex facial fracture types: No LeFort, zygomaticomaxillary complex or nasoorbitoethmoidal fracture. --Simple fracture types: None. --Mandible: No fracture or dislocation. Orbits: The globes are intact. Normal appearance of the intra- and extraconal fat. Symmetric extraocular muscles and optic nerves. Sinuses: No fluid levels or advanced mucosal thickening. Soft tissues: Normal visualized extracranial soft tissues. CT CERVICAL SPINE FINDINGS Alignment: No static subluxation. Facets are aligned. Occipital condyles and the lateral masses of C1-C2 are aligned. Skull base and vertebrae: No acute fracture. Soft tissues and spinal canal: No prevertebral fluid or swelling. No visible canal hematoma. Disc levels: No advanced spinal canal or neural foraminal stenosis. Upper chest: No pneumothorax, pulmonary nodule or pleural effusion. Other: Normal visualized paraspinal cervical soft tissues. IMPRESSION: 1. No acute intracranial abnormality. 2.  Large left frontal scalp hematoma without skull fracture or facial fracture. 3. No acute fracture or static subluxation of the cervical spine. Electronically Signed   By: Ulyses Jarred M.D.   On: 11/26/2021 02:31   DG Knee Complete 4 Views Right  Result Date: 11/26/2021 CLINICAL DATA:  Fall, rule out fracture. EXAM: RIGHT KNEE - COMPLETE 4+ VIEW COMPARISON:  None. FINDINGS: No acute fracture. Normal alignment, no dislocation. Chondrocalcinosis with mild peripheral spurring of the medial tibiofemoral compartment. No significant knee joint effusion. Advanced vascular calcifications. Generalized soft tissue edema. Linear soft tissue calcifications in the calf may be dystrophic. IMPRESSION: 1. No acute fracture or subluxation. 2. Generalized soft tissue edema. 3. Chondrocalcinosis and mild degenerative change. Electronically Signed   By: Keith Rake M.D.   On: 11/26/2021 02:01   CT Maxillofacial Wo Contrast  Result Date: 11/26/2021 CLINICAL DATA:  Fall EXAM: CT HEAD WITHOUT CONTRAST CT MAXILLOFACIAL WITHOUT CONTRAST CT CERVICAL SPINE WITHOUT CONTRAST TECHNIQUE: Multidetector CT imaging of the head, cervical spine, and maxillofacial structures were performed using the standard protocol without intravenous contrast. Multiplanar CT image reconstructions of the cervical spine and maxillofacial structures were also generated. RADIATION DOSE REDUCTION: This exam was performed according to the departmental dose-optimization program which includes automated exposure control, adjustment of the mA and/or kV according to patient size and/or use of iterative reconstruction technique. COMPARISON:  None. FINDINGS: CT HEAD FINDINGS Brain: There is no mass, hemorrhage or extra-axial collection. The size and configuration of the ventricles and extra-axial CSF spaces are normal. There is hypoattenuation of the periventricular white matter, most commonly indicating chronic ischemic microangiopathy. Vascular: Atherosclerotic  calcification of the internal carotid arteries at the skull base. No abnormal hyperdensity of the major intracranial arteries or dural venous sinuses. Skull: Large left frontal scalp hematoma.  No skull fracture. CT MAXILLOFACIAL FINDINGS Osseous: --Complex facial fracture types: No LeFort, zygomaticomaxillary complex or nasoorbitoethmoidal fracture. --Simple fracture types: None. --Mandible: No fracture or dislocation. Orbits: The globes are intact. Normal appearance of the intra- and extraconal fat. Symmetric extraocular muscles and optic nerves. Sinuses: No fluid levels or advanced mucosal thickening. Soft tissues: Normal visualized extracranial soft tissues. CT CERVICAL SPINE FINDINGS Alignment: No static subluxation. Facets are aligned. Occipital condyles and the lateral masses of C1-C2 are aligned. Skull base and vertebrae: No  acute fracture. Soft tissues and spinal canal: No prevertebral fluid or swelling. No visible canal hematoma. Disc levels: No advanced spinal canal or neural foraminal stenosis. Upper chest: No pneumothorax, pulmonary nodule or pleural effusion. Other: Normal visualized paraspinal cervical soft tissues. IMPRESSION: 1. No acute intracranial abnormality. 2. Large left frontal scalp hematoma without skull fracture or facial fracture. 3. No acute fracture or static subluxation of the cervical spine. Electronically Signed   By: Ulyses Jarred M.D.   On: 11/26/2021 02:31    EKG: Independently reviewed.  Normal sinus rhythm.  Assessment/Plan Principal Problem:   Near syncope Active Problems:   DM2 (diabetes mellitus, type 2) (HCC)   HTN (hypertension)   Hyperlipidemia   TIA (transient ischemic attack)   UTI (urinary tract infection)    Near syncopal episode.  Could be from the vomiting and poor oral intake after the fall.  We will gently hydrate get physical therapy consult we will monitor in telemetry.  Hold hydrochlorothiazide. Nausea vomiting could be from concussion.  Abdomen  appears benign.  Continue to monitor. Scalp hematoma status post fall with vomiting could be from concussion.  Closely monitor. UTI on ceftriaxone follow urine cultures. Hypertension we will continue beta-blockers hold hydrochlorothiazide for nausea and patient is receiving fluids.  As needed IV hydralazine for systolic more than 106. Diabetes mellitus type 2 on insulin 500.  Continue home regimen with sliding scale coverage. History of TIA takes Plavix with me on hold due to large scalp and periorbital hematoma.  Patient has not taken for last 2 days. Chronic kidney disease stage III creatinine appears to be at baseline. Mild hyponatremia could be from dehydration and also patient uses hydrochlorothiazide.  Gently hydrate follow metabolic panel hydrochlorothiazide on hold due to hypotensive episodes.  COVID test pending.   DVT prophylaxis: SCDs.  Since patient has large scalp hematoma and periorbital hematoma avoiding anticoagulation. Code Status: Full code. Family Communication: Discussed with patient. Disposition Plan: To be determined. Consults called: Physical therapy. Admission status: Observation.   Rise Patience MD [facility] Pager [phone].  If 7PM-7AM, please contact night-coverage [email or web address] Password [facility]  11/26/2021, 3:55 AM

## 2021-11-27 DIAGNOSIS — Z803 Family history of malignant neoplasm of breast: Secondary | ICD-10-CM | POA: Diagnosis not present

## 2021-11-27 DIAGNOSIS — M47816 Spondylosis without myelopathy or radiculopathy, lumbar region: Secondary | ICD-10-CM | POA: Diagnosis present

## 2021-11-27 DIAGNOSIS — Z7902 Long term (current) use of antithrombotics/antiplatelets: Secondary | ICD-10-CM | POA: Diagnosis not present

## 2021-11-27 DIAGNOSIS — M109 Gout, unspecified: Secondary | ICD-10-CM | POA: Diagnosis not present

## 2021-11-27 DIAGNOSIS — Y92019 Unspecified place in single-family (private) house as the place of occurrence of the external cause: Secondary | ICD-10-CM | POA: Diagnosis not present

## 2021-11-27 DIAGNOSIS — W1830XA Fall on same level, unspecified, initial encounter: Secondary | ICD-10-CM | POA: Diagnosis present

## 2021-11-27 DIAGNOSIS — Z8673 Personal history of transient ischemic attack (TIA), and cerebral infarction without residual deficits: Secondary | ICD-10-CM | POA: Diagnosis not present

## 2021-11-27 DIAGNOSIS — S0003XA Contusion of scalp, initial encounter: Secondary | ICD-10-CM | POA: Diagnosis present

## 2021-11-27 DIAGNOSIS — E860 Dehydration: Secondary | ICD-10-CM | POA: Diagnosis present

## 2021-11-27 DIAGNOSIS — S060X0A Concussion without loss of consciousness, initial encounter: Secondary | ICD-10-CM | POA: Diagnosis present

## 2021-11-27 DIAGNOSIS — Z888 Allergy status to other drugs, medicaments and biological substances status: Secondary | ICD-10-CM | POA: Diagnosis not present

## 2021-11-27 DIAGNOSIS — N1832 Chronic kidney disease, stage 3b: Secondary | ICD-10-CM | POA: Diagnosis present

## 2021-11-27 DIAGNOSIS — T502X5A Adverse effect of carbonic-anhydrase inhibitors, benzothiadiazides and other diuretics, initial encounter: Secondary | ICD-10-CM | POA: Diagnosis present

## 2021-11-27 DIAGNOSIS — Z794 Long term (current) use of insulin: Secondary | ICD-10-CM | POA: Diagnosis not present

## 2021-11-27 DIAGNOSIS — Z8249 Family history of ischemic heart disease and other diseases of the circulatory system: Secondary | ICD-10-CM | POA: Diagnosis not present

## 2021-11-27 DIAGNOSIS — I959 Hypotension, unspecified: Secondary | ICD-10-CM | POA: Diagnosis present

## 2021-11-27 DIAGNOSIS — R55 Syncope and collapse: Secondary | ICD-10-CM | POA: Diagnosis present

## 2021-11-27 DIAGNOSIS — Z6841 Body Mass Index (BMI) 40.0 and over, adult: Secondary | ICD-10-CM | POA: Diagnosis not present

## 2021-11-27 DIAGNOSIS — E871 Hypo-osmolality and hyponatremia: Secondary | ICD-10-CM | POA: Diagnosis present

## 2021-11-27 DIAGNOSIS — E669 Obesity, unspecified: Secondary | ICD-10-CM | POA: Diagnosis present

## 2021-11-27 DIAGNOSIS — Z880 Allergy status to penicillin: Secondary | ICD-10-CM | POA: Diagnosis not present

## 2021-11-27 DIAGNOSIS — N390 Urinary tract infection, site not specified: Secondary | ICD-10-CM | POA: Diagnosis present

## 2021-11-27 DIAGNOSIS — Z79899 Other long term (current) drug therapy: Secondary | ICD-10-CM | POA: Diagnosis not present

## 2021-11-27 DIAGNOSIS — E785 Hyperlipidemia, unspecified: Secondary | ICD-10-CM | POA: Diagnosis present

## 2021-11-27 DIAGNOSIS — E1149 Type 2 diabetes mellitus with other diabetic neurological complication: Secondary | ICD-10-CM | POA: Diagnosis not present

## 2021-11-27 DIAGNOSIS — Z881 Allergy status to other antibiotic agents status: Secondary | ICD-10-CM | POA: Diagnosis not present

## 2021-11-27 DIAGNOSIS — E1122 Type 2 diabetes mellitus with diabetic chronic kidney disease: Secondary | ICD-10-CM | POA: Diagnosis present

## 2021-11-27 DIAGNOSIS — Z20822 Contact with and (suspected) exposure to covid-19: Secondary | ICD-10-CM | POA: Diagnosis present

## 2021-11-27 DIAGNOSIS — I129 Hypertensive chronic kidney disease with stage 1 through stage 4 chronic kidney disease, or unspecified chronic kidney disease: Secondary | ICD-10-CM | POA: Diagnosis present

## 2021-11-27 LAB — CBC WITH DIFFERENTIAL/PLATELET
Abs Immature Granulocytes: 0.08 10*3/uL — ABNORMAL HIGH (ref 0.00–0.07)
Basophils Absolute: 0 10*3/uL (ref 0.0–0.1)
Basophils Relative: 0 %
Eosinophils Absolute: 0 10*3/uL (ref 0.0–0.5)
Eosinophils Relative: 0 %
HCT: 37.9 % (ref 36.0–46.0)
Hemoglobin: 13.2 g/dL (ref 12.0–15.0)
Immature Granulocytes: 1 %
Lymphocytes Relative: 13 %
Lymphs Abs: 2 10*3/uL (ref 0.7–4.0)
MCH: 31.1 pg (ref 26.0–34.0)
MCHC: 34.8 g/dL (ref 30.0–36.0)
MCV: 89.2 fL (ref 80.0–100.0)
Monocytes Absolute: 2.2 10*3/uL — ABNORMAL HIGH (ref 0.1–1.0)
Monocytes Relative: 14 %
Neutro Abs: 11.3 10*3/uL — ABNORMAL HIGH (ref 1.7–7.7)
Neutrophils Relative %: 72 %
Platelets: 235 10*3/uL (ref 150–400)
RBC: 4.25 MIL/uL (ref 3.87–5.11)
RDW: 14.3 % (ref 11.5–15.5)
WBC: 15.7 10*3/uL — ABNORMAL HIGH (ref 4.0–10.5)
nRBC: 0 % (ref 0.0–0.2)

## 2021-11-27 LAB — BASIC METABOLIC PANEL
Anion gap: 11 (ref 5–15)
BUN: 15 mg/dL (ref 8–23)
CO2: 28 mmol/L (ref 22–32)
Calcium: 8.9 mg/dL (ref 8.9–10.3)
Chloride: 93 mmol/L — ABNORMAL LOW (ref 98–111)
Creatinine, Ser: 1.43 mg/dL — ABNORMAL HIGH (ref 0.44–1.00)
GFR, Estimated: 39 mL/min — ABNORMAL LOW (ref >60)
Glucose, Bld: 164 mg/dL — ABNORMAL HIGH (ref 70–99)
Potassium: 3.8 mmol/L (ref 3.5–5.1)
Sodium: 132 mmol/L — ABNORMAL LOW (ref 135–145)

## 2021-11-27 LAB — GLUCOSE, CAPILLARY
Glucose-Capillary: 207 mg/dL — ABNORMAL HIGH (ref 70–99)
Glucose-Capillary: 238 mg/dL — ABNORMAL HIGH (ref 70–99)
Glucose-Capillary: 267 mg/dL — ABNORMAL HIGH (ref 70–99)
Glucose-Capillary: 234 mg/dL — ABNORMAL HIGH (ref 70–99)

## 2021-11-27 LAB — URINE CULTURE: Culture: NO GROWTH

## 2021-11-27 LAB — MAGNESIUM: Magnesium: 0.9 mg/dL — CL (ref 1.7–2.4)

## 2021-11-27 MED ORDER — MAGNESIUM OXIDE -MG SUPPLEMENT 400 (240 MG) MG PO TABS
400.0000 mg | ORAL_TABLET | Freq: Two times a day (BID) | ORAL | Status: DC
Start: 2021-11-27 — End: 2021-11-30
  Administered 2021-11-27 – 2021-11-30 (×7): 400 mg via ORAL
  Filled 2021-11-27 (×7): qty 1

## 2021-11-27 MED ORDER — PREDNISONE 20 MG PO TABS
40.0000 mg | ORAL_TABLET | Freq: Every day | ORAL | Status: DC
  Administered 2021-11-28 – 2021-11-30 (×3): 40 mg via ORAL
  Filled 2021-11-27 (×3): qty 2

## 2021-11-27 MED ORDER — MAGNESIUM SULFATE 4 GM/100ML IV SOLN
4.0000 g | Freq: Once | INTRAVENOUS | Status: AC
  Administered 2021-11-27: 4 g via INTRAVENOUS
  Filled 2021-11-27: qty 100

## 2021-11-27 MED ORDER — NEBIVOLOL HCL 10 MG PO TABS
10.0000 mg | ORAL_TABLET | Freq: Two times a day (BID) | ORAL | Status: DC
  Administered 2021-11-27 – 2021-11-30 (×7): 10 mg via ORAL
  Filled 2021-11-27 (×7): qty 1

## 2021-11-27 MED ORDER — METHYLPREDNISOLONE SODIUM SUCC 125 MG IJ SOLR
60.0000 mg | Freq: Once | INTRAMUSCULAR | Status: AC
  Administered 2021-11-27: 60 mg via INTRAVENOUS
  Filled 2021-11-27: qty 2

## 2021-11-27 NOTE — Progress Notes (Signed)
PROGRESS NOTE    Kristine YARBOROUGH  PNT:[id number] DOB: Jun 20, 1951 DOA: 11/26/2021 PCP: Bernerd Limbo, MD    Chief Complaint  Patient presents with   Fall    Brief Narrative:  Patient 71 year old female prior history of TIA, type 2 diabetes, hypertension, CKD stage III brought to the ED as she had difficulty getting up from the commode.  Patient's husband checked her blood pressure noted to be hypotensive with systolic in the 08Q.  Patient noted the day prior to admission had a fall hit her face and sustained bruises on the scalp and around her eyes.  Patient also noted to have some episodes of nausea and vomiting.  Patient brought by EMS to the ED noted to have systolic blood pressures in the 100s improved with IV fluids.  Urinalysis concerning for UTI.  CT head and CT maxillofacial showing large scalp hematoma, periorbital hematoma.  CT head unremarkable.  CT C-spine negative.  Plain films negative for any fracture.  Patient placed on IV antibiotics, IV fluids and admitted for further evaluation and management.   Assessment & Plan:   Principal Problem:   Near syncope Active Problems:   DM2 (diabetes mellitus, type 2) (HCC)   HTN (hypertension)   Hyperlipidemia   TIA (transient ischemic attack)   UTI (urinary tract infection)   Hypomagnesemia   Gout attack  #1 near syncopal episode -Likely multifactorial secondary to dehydration from emesis, poor oral intake after full in the setting of probable UTI and diuretics. -Blood pressure responded to IV fluids. -CT head negative for any acute intracranial abnormalities. -Patient pancultured with urine cultures and blood cultures pending.  -COVID-19 PCR negative. -Continue IV fluids, IV Rocephin. -PT/OT.  2.  UTI -Urine cultures pending  -Continue IV Rocephin.  3.  Dehydration -IV fluids.  4.  Hypertension -Continue to hold HCTZ. -Blood pressure borderline this morning and as such Bystolic discontinued. -Blood pressure  improved this afternoon and as such we will resume Bystolic. -Hydralazine as needed.  5.  Diabetes mellitus type 2 -Hemoglobin A1c 8.1 (11/26/2021) -Patient noted on insulin U-500. -Per RN patient noted to have some bouts of hypoglycemia at home and as such U-500 has been decreased to 50 units 3 times daily.  -Diabetic coordinator consulted.  6.  Nausea/vomiting -Likely secondary to concussion. -Abdomen benign. -Clinical improvement -IV fluids, IV antiemetics, supportive care.  7.  History of TIA -Patient noted to be on Plavix however currently on hold due to large scalp and periorbital hematoma. -PT/OT.  8.  CKD stage IIIb -Currently at baseline. -IV fluids.  9. mild hyponatremia -Likely secondary to hypovolemic hyponatremia in the setting of HCTZ. -Continue to hold HCTZ.   -Hyponatremia improving with hydration.  10.  Scalp hematoma status post fall -Continue warm/cold compresses. -Continue to hold Plavix.  11.  Acute gouty attack -Patient with complaints of gout attack in the right ankle which is exquisitely tender to palpation with some warmth but no erythema. -IV Solu-Medrol 60 mg x 1 and then subsequently prednisone 40 mg daily for 4 to 5 days. -We will need outpatient follow-up with PCP.  12.  Severe hypomagnesemia -Magnesium at 0.9. -Magnesium sulfate 4 g IV x1 ordered this morning. -Repeat labs in the AM.   DVT prophylaxis: SCDs Code Status: Full Family Communication: Updated patient.  No family at bedside. Disposition:   Status is: Observation The patient remains OBS appropriate and will d/c before 2 midnights.         Consultants:  None  Procedures:  CT head 11/26/2021 CT maxillofacial 11/26/2021 CT C-spine 11/26/2021 Chest x-ray 11/26/2021 Plain films of the right ankle 11/26/2021 Plan.  The left forearm 11/26/2021 Plain films of the right knee 11/26/2021 Plain films of the pelvis 11/26/2021 Plain films of the right shoulder 11/26/2021  Antimicrobials:   IV Rocephin 11/26/2021>>>>   Subjective: Laying in bed.  Stated had significant gout pain in the right ankle last night and had nothing to help with it.  States he usually uses colchicine but it was not ordered.  No chest pain.  No shortness of breath.  Dizziness and lightheadedness improved.  Overall feeling better.    Objective: Vitals:   11/26/21 2006 11/27/21 0759 11/27/21 1632 11/27/21 1726  BP: (!) 155/54 107/60 (!) 180/70 (!) 177/80  Pulse: 76 81 82 85  Resp: '18 18 18   '$ Temp: 99.4 F (37.4 C) 98.4 F (36.9 C) 99.1 F (37.3 C)   TempSrc: Oral Oral    SpO2: 100% 100% 94%   Weight: 124.6 kg     Height:        Intake/Output Summary (Last 24 hours) at 11/27/2021 1753 Last data filed at 11/27/2021 1742 Gross per 24 hour  Intake 2021.06 ml  Output --  Net 2021.06 ml   Filed Weights   11/26/21 0043 11/26/21 1610 11/26/21 2006  Weight: 122.9 kg 124.1 kg 124.6 kg    Examination:  General exam: NAD.  Hematoma noted on the left head.  Ecchymosis noted around eyes. Respiratory system: CTA B.  No wheezes, no crackles, no rhonchi.  Fair air movement.  Speaking in full sentences. Cardiovascular system: Regular rate rhythm no murmurs rubs or gallops.  No JVD.  No lower extremity edema. Gastrointestinal system: Abdomen is soft, nontender, nondistended, positive bowel sounds.  No rebound.  No guarding. Central nervous system: Alert and oriented. No focal neurological deficits. Extremities: Right ankle with some warmth, tenderness to palpation.  Skin: No rashes, lesions or ulcers Psychiatry: Judgement and insight appear normal. Mood & affect appropriate.     Data Reviewed: I have personally reviewed following labs and imaging studies  CBC: Recent Labs  Lab 11/26/21 0057 11/26/21 0356 11/27/21 0239  WBC 12.8* 13.0* 15.7*  NEUTROABS 8.5*  --  11.3*  HGB 13.9 13.6 13.2  HCT 40.1 40.7 37.9  MCV 91.3 91.3 89.2  PLT 235 225 035    Basic Metabolic Panel: Recent Labs  Lab  11/26/21 0057 11/26/21 0356 11/27/21 0239  NA 130* 131* 132*  K 4.2 3.7 3.8  CL 90* 91* 93*  CO2 '26 24 28  '$ GLUCOSE 258* 233* 164*  BUN '17 16 15  '$ CREATININE 1.88* 1.71* 1.43*  CALCIUM 9.0 9.0 8.9  MG  --   --  0.9*    GFR: Estimated Creatinine Clearance: 47.1 mL/min (A) (by C-G formula based on SCr of 1.43 mg/dL (H)).  Liver Function Tests: Recent Labs  Lab 11/26/21 0057  AST 25  ALT 21  ALKPHOS 70  BILITOT 1.4*  PROT 6.6  ALBUMIN 3.6    CBG: Recent Labs  Lab 11/26/21 1626 11/26/21 2158 11/27/21 0734 11/27/21 1120 11/27/21 1627  GLUCAP 235* 105* 207* 238* 267*     Recent Results (from the past 240 hour(s))  Resp Panel by RT-PCR (Flu A&B, Covid) Nasopharyngeal Swab     Status: None   Collection Time: 11/26/21  5:52 AM   Specimen: Nasopharyngeal Swab; Nasopharyngeal(NP) swabs in vial transport medium  Result Value Ref Range Status   SARS Coronavirus 2 by  RT PCR NEGATIVE NEGATIVE Final    Comment: (NOTE) SARS-CoV-2 target nucleic acids are NOT DETECTED.  The SARS-CoV-2 RNA is generally detectable in upper respiratory specimens during the acute phase of infection. The lowest concentration of SARS-CoV-2 viral copies this assay can detect is 138 copies/mL. A negative result does not preclude SARS-Cov-2 infection and should not be used as the sole basis for treatment or other patient management decisions. A negative result may occur with  improper specimen collection/handling, submission of specimen other than nasopharyngeal swab, presence of viral mutation(s) within the areas targeted by this assay, and inadequate number of viral copies(<138 copies/mL). A negative result must be combined with clinical observations, patient history, and epidemiological information. The expected result is Negative.  Fact Sheet for Patients:  EntrepreneurPulse.com.au  Fact Sheet for Healthcare Providers:  IncredibleEmployment.be  This test  is no t yet approved or cleared by the [place] FDA and  has been authorized for detection and/or diagnosis of SARS-CoV-2 by FDA under an Emergency Use Authorization (EUA). This EUA will remain  in effect (meaning this test can be used) for the duration of the COVID-19 declaration under Section 564(b)(1) of the Act, 21 U.S.C.section 360bbb-3(b)(1), unless the authorization is terminated  or revoked sooner.       Influenza A by PCR NEGATIVE NEGATIVE Final   Influenza B by PCR NEGATIVE NEGATIVE Final    Comment: (NOTE) The Xpert Xpress SARS-CoV-2/FLU/RSV plus assay is intended as an aid in the diagnosis of influenza from Nasopharyngeal swab specimens and should not be used as a sole basis for treatment. Nasal washings and aspirates are unacceptable for Xpert Xpress SARS-CoV-2/FLU/RSV testing.  Fact Sheet for Patients: EntrepreneurPulse.com.au  Fact Sheet for Healthcare Providers: IncredibleEmployment.be  This test is not yet approved or cleared by the [place] FDA and has been authorized for detection and/or diagnosis of SARS-CoV-2 by FDA under an Emergency Use Authorization (EUA). This EUA will remain in effect (meaning this test can be used) for the duration of the COVID-19 declaration under Section 564(b)(1) of the Act, 21 U.S.C. section 360bbb-3(b)(1), unless the authorization is terminated or revoked.  Performed at [facility], [place] [address], [place], [place] [address]   Urine Culture     Status: None   Collection Time: 11/26/21 10:17 AM   Specimen: Urine, Catheterized  Result Value Ref Range Status   Specimen Description URINE, CATHETERIZED  Final   Special Requests NONE  Final   Culture   Final    NO GROWTH Performed at [facility], [address], [place], [place] [address]    Report Status 11/27/2021 FINAL  Final  Culture, blood (Routine X 2) w Reflex to ID Panel     Status: None (Preliminary result)    Collection Time: 11/26/21 10:54 AM   Specimen: BLOOD RIGHT HAND  Result Value Ref Range Status   Specimen Description BLOOD RIGHT HAND  Final   Special Requests   Final    BOTTLES DRAWN AEROBIC AND ANAEROBIC Blood Culture results may not be optimal due to an inadequate volume of blood received in culture bottles   Culture   Final    NO GROWTH 1 DAY Performed at [facility], [place] [address] [place], [place] [address]    Report Status PENDING  Incomplete  Culture, blood (Routine X 2) w Reflex to ID Panel     Status: None (Preliminary result)   Collection Time: 11/26/21  4:14 PM   Specimen: BLOOD  Result Value Ref Range Status   Specimen Description BLOOD SITE NOT SPECIFIED  Final   Special Requests   Final    BOTTLES DRAWN AEROBIC ONLY Blood Culture results may not be optimal due to an inadequate volume of blood received in culture bottles   Culture   Final    NO GROWTH < 24 HOURS Performed at [facility] [address], [place], [place] [address]    Report Status PENDING  Incomplete         Radiology Studies: DG Chest 2 View  Result Date: 11/26/2021 CLINICAL DATA:  Fall, rule out fracture. EXAM: CHEST - 2 VIEW COMPARISON:  03/20/2012. FINDINGS: The heart is enlarged and the mediastinal contour is stable. Atherosclerotic calcification of the aorta is noted. Both lungs are clear. No acute osseous abnormality. IMPRESSION: No active cardiopulmonary disease. Electronically Signed   By: Brett Fairy M.D.   On: 11/26/2021 02:02   DG Pelvis 1-2 Views  Result Date: 11/26/2021 CLINICAL DATA:  Fall, rule out fracture. EXAM: PELVIS - 1-2 VIEW COMPARISON:  None. FINDINGS: The cortical margins of the bony pelvis are intact. No fracture. Pubic symphysis and sacroiliac joints are congruent. Bilateral hip osteoarthritis. Both femoral heads are well-seated in the respective acetabula. IMPRESSION: No pelvic fracture. Electronically Signed   By: Keith Rake M.D.   On: 11/26/2021  02:02   DG Shoulder Right  Result Date: 11/26/2021 CLINICAL DATA:  Fall, rule out fracture. EXAM: RIGHT SHOULDER - 2+ VIEW COMPARISON:  None. FINDINGS: There is no evidence of fracture or dislocation. Acromioclavicular degenerative change with spurring. Soft tissues are unremarkable. IMPRESSION: No fracture or subluxation of the right shoulder. Electronically Signed   By: Keith Rake M.D.   On: 11/26/2021 02:02   DG Forearm Left  Result Date: 11/26/2021 CLINICAL DATA:  Fall, rule out fracture. EXAM: LEFT FOREARM - 2 VIEW COMPARISON:  None. FINDINGS: The cortical margins of the radius and ulna are intact. There is no evidence of fracture or other focal bone lesions. Wrist and elbow alignment are grossly maintained. Soft tissues are unremarkable. IMPRESSION: No fracture of the left forearm. Electronically Signed   By: Keith Rake M.D.   On: 11/26/2021 02:00   DG Ankle Complete Right  Result Date: 11/26/2021 CLINICAL DATA:  Fall, concern for fracture. EXAM: RIGHT ANKLE - COMPLETE 3+ VIEW COMPARISON:  None. FINDINGS: There is no evidence of fracture, dislocation, or joint effusion. Degenerative changes are present in the midfoot and hindfoot. Large calcaneal spur. Enthesopathic changes are noted at the insertion site of the Achilles tendon. Vascular calcifications are present in the soft tissues. Mild soft tissue swelling is present ankle lateral and anteriorly. IMPRESSION: No acute fracture or dislocation. Electronically Signed   By: Brett Fairy M.D.   On: 11/26/2021 02:01   CT Head Wo Contrast  Result Date: 11/26/2021 CLINICAL DATA:  Fall EXAM: CT HEAD WITHOUT CONTRAST CT MAXILLOFACIAL WITHOUT CONTRAST CT CERVICAL SPINE WITHOUT CONTRAST TECHNIQUE: Multidetector CT imaging of the head, cervical spine, and maxillofacial structures were performed using the standard protocol without intravenous contrast. Multiplanar CT image reconstructions of the cervical spine and maxillofacial structures were  also generated. RADIATION DOSE REDUCTION: This exam was performed according to the departmental dose-optimization program which includes automated exposure control, adjustment of the mA and/or kV according to patient size and/or use of iterative reconstruction technique. COMPARISON:  None. FINDINGS: CT HEAD FINDINGS Brain: There is no mass, hemorrhage or extra-axial collection. The size and configuration of the  ventricles and extra-axial CSF spaces are normal. There is hypoattenuation of the periventricular white matter, most commonly indicating chronic ischemic microangiopathy. Vascular: Atherosclerotic calcification of the internal carotid arteries at the skull base. No abnormal hyperdensity of the major intracranial arteries or dural venous sinuses. Skull: Large left frontal scalp hematoma.  No skull fracture. CT MAXILLOFACIAL FINDINGS Osseous: --Complex facial fracture types: No LeFort, zygomaticomaxillary complex or nasoorbitoethmoidal fracture. --Simple fracture types: None. --Mandible: No fracture or dislocation. Orbits: The globes are intact. Normal appearance of the intra- and extraconal fat. Symmetric extraocular muscles and optic nerves. Sinuses: No fluid levels or advanced mucosal thickening. Soft tissues: Normal visualized extracranial soft tissues. CT CERVICAL SPINE FINDINGS Alignment: No static subluxation. Facets are aligned. Occipital condyles and the lateral masses of C1-C2 are aligned. Skull base and vertebrae: No acute fracture. Soft tissues and spinal canal: No prevertebral fluid or swelling. No visible canal hematoma. Disc levels: No advanced spinal canal or neural foraminal stenosis. Upper chest: No pneumothorax, pulmonary nodule or pleural effusion. Other: Normal visualized paraspinal cervical soft tissues. IMPRESSION: 1. No acute intracranial abnormality. 2. Large left frontal scalp hematoma without skull fracture or facial fracture. 3. No acute fracture or static subluxation of the cervical  spine. Electronically Signed   By: Ulyses Jarred M.D.   On: 11/26/2021 02:31   CT Cervical Spine Wo Contrast  Result Date: 11/26/2021 CLINICAL DATA:  Fall EXAM: CT HEAD WITHOUT CONTRAST CT MAXILLOFACIAL WITHOUT CONTRAST CT CERVICAL SPINE WITHOUT CONTRAST TECHNIQUE: Multidetector CT imaging of the head, cervical spine, and maxillofacial structures were performed using the standard protocol without intravenous contrast. Multiplanar CT image reconstructions of the cervical spine and maxillofacial structures were also generated. RADIATION DOSE REDUCTION: This exam was performed according to the departmental dose-optimization program which includes automated exposure control, adjustment of the mA and/or kV according to patient size and/or use of iterative reconstruction technique. COMPARISON:  None. FINDINGS: CT HEAD FINDINGS Brain: There is no mass, hemorrhage or extra-axial collection. The size and configuration of the ventricles and extra-axial CSF spaces are normal. There is hypoattenuation of the periventricular white matter, most commonly indicating chronic ischemic microangiopathy. Vascular: Atherosclerotic calcification of the internal carotid arteries at the skull base. No abnormal hyperdensity of the major intracranial arteries or dural venous sinuses. Skull: Large left frontal scalp hematoma.  No skull fracture. CT MAXILLOFACIAL FINDINGS Osseous: --Complex facial fracture types: No LeFort, zygomaticomaxillary complex or nasoorbitoethmoidal fracture. --Simple fracture types: None. --Mandible: No fracture or dislocation. Orbits: The globes are intact. Normal appearance of the intra- and extraconal fat. Symmetric extraocular muscles and optic nerves. Sinuses: No fluid levels or advanced mucosal thickening. Soft tissues: Normal visualized extracranial soft tissues. CT CERVICAL SPINE FINDINGS Alignment: No static subluxation. Facets are aligned. Occipital condyles and the lateral masses of C1-C2 are aligned.  Skull base and vertebrae: No acute fracture. Soft tissues and spinal canal: No prevertebral fluid or swelling. No visible canal hematoma. Disc levels: No advanced spinal canal or neural foraminal stenosis. Upper chest: No pneumothorax, pulmonary nodule or pleural effusion. Other: Normal visualized paraspinal cervical soft tissues. IMPRESSION: 1. No acute intracranial abnormality. 2. Large left frontal scalp hematoma without skull fracture or facial fracture. 3. No acute fracture or static subluxation of the cervical spine. Electronically Signed   By: Ulyses Jarred M.D.   On: 11/26/2021 02:31   DG Knee Complete 4 Views Right  Result Date: 11/26/2021 CLINICAL DATA:  Fall, rule out fracture. EXAM: RIGHT KNEE - COMPLETE 4+ VIEW COMPARISON:  None. FINDINGS:  No acute fracture. Normal alignment, no dislocation. Chondrocalcinosis with mild peripheral spurring of the medial tibiofemoral compartment. No significant knee joint effusion. Advanced vascular calcifications. Generalized soft tissue edema. Linear soft tissue calcifications in the calf may be dystrophic. IMPRESSION: 1. No acute fracture or subluxation. 2. Generalized soft tissue edema. 3. Chondrocalcinosis and mild degenerative change. Electronically Signed   By: Keith Rake M.D.   On: 11/26/2021 02:01   CT Maxillofacial Wo Contrast  Result Date: 11/26/2021 CLINICAL DATA:  Fall EXAM: CT HEAD WITHOUT CONTRAST CT MAXILLOFACIAL WITHOUT CONTRAST CT CERVICAL SPINE WITHOUT CONTRAST TECHNIQUE: Multidetector CT imaging of the head, cervical spine, and maxillofacial structures were performed using the standard protocol without intravenous contrast. Multiplanar CT image reconstructions of the cervical spine and maxillofacial structures were also generated. RADIATION DOSE REDUCTION: This exam was performed according to the departmental dose-optimization program which includes automated exposure control, adjustment of the mA and/or kV according to patient size and/or  use of iterative reconstruction technique. COMPARISON:  None. FINDINGS: CT HEAD FINDINGS Brain: There is no mass, hemorrhage or extra-axial collection. The size and configuration of the ventricles and extra-axial CSF spaces are normal. There is hypoattenuation of the periventricular white matter, most commonly indicating chronic ischemic microangiopathy. Vascular: Atherosclerotic calcification of the internal carotid arteries at the skull base. No abnormal hyperdensity of the major intracranial arteries or dural venous sinuses. Skull: Large left frontal scalp hematoma.  No skull fracture. CT MAXILLOFACIAL FINDINGS Osseous: --Complex facial fracture types: No LeFort, zygomaticomaxillary complex or nasoorbitoethmoidal fracture. --Simple fracture types: None. --Mandible: No fracture or dislocation. Orbits: The globes are intact. Normal appearance of the intra- and extraconal fat. Symmetric extraocular muscles and optic nerves. Sinuses: No fluid levels or advanced mucosal thickening. Soft tissues: Normal visualized extracranial soft tissues. CT CERVICAL SPINE FINDINGS Alignment: No static subluxation. Facets are aligned. Occipital condyles and the lateral masses of C1-C2 are aligned. Skull base and vertebrae: No acute fracture. Soft tissues and spinal canal: No prevertebral fluid or swelling. No visible canal hematoma. Disc levels: No advanced spinal canal or neural foraminal stenosis. Upper chest: No pneumothorax, pulmonary nodule or pleural effusion. Other: Normal visualized paraspinal cervical soft tissues. IMPRESSION: 1. No acute intracranial abnormality. 2. Large left frontal scalp hematoma without skull fracture or facial fracture. 3. No acute fracture or static subluxation of the cervical spine. Electronically Signed   By: Ulyses Jarred M.D.   On: 11/26/2021 02:31        Scheduled Meds:  allopurinol  100 mg Oral Daily   atorvastatin  80 mg Oral QPM   cyclobenzaprine  10 mg Oral Daily   insulin aspart   0-9 Units Subcutaneous TID WC   insulin regular human CONCENTRATED  50 Units Subcutaneous TID with meals   magnesium oxide  400 mg Oral BID   nebivolol  10 mg Oral BID   [START ON 11/28/2021] predniSONE  40 mg Oral QAC breakfast   vitamin B-12  5,000 mcg Oral Daily   Continuous Infusions:  cefTRIAXone (ROCEPHIN)  IV Stopped (11/27/21 6269)   lactated ringers Stopped (11/27/21 1330)     LOS: 0 days    Time spent: 35 minutes.    Irine Seal, MD [facility]   To contact the attending provider between 7A-7P or the covering provider during after hours 7P-7A, please log into the web site [email or web address] and access using universal [place] password for that web site. If you do not have the password, please call the hospital operator.  11/27/2021, 5:53  7:24 PM

## 2021-11-27 NOTE — Evaluation (Signed)
Occupational Therapy Evaluation Patient Details Name: Kristine Gray MRN: [id number] DOB: Jul 25, 1951 Today's Date: 11/27/2021   History of Present Illness 71 yo female with onset of dizziness and near syncope was noted to have previously had a  fall at home, returned home yesterday from ED.  Now brought to hosp on 3/4, after recent fall was cleared for head and neck fractures but UTI suspected.  Has nephrostomy site, L flank.  Current concern after striking head of concussion, with mult episodes of N&V and husband having recorded low BP at home.  PMHx:  Falls, TIA, DM, HTN, CKD3, low BP   Clinical Impression   Pt admitted for concerns listed above. PTA pt reported that she was independent with all ADL's and IADL's, however does have a history of falling/passing out. At this time, pt presents with dizziness in sitting/attempting to stand, increased pain in RLE, and global weakness. She is requiring mod A for bed mobility and is unable to stand at this time due to increased pain with WB in RLE. Pt also needs min-max A for all ADL's due to pain and weakness. Recommending SNF level therapies at this time to maximize her independence, however if pain reduces and she requires less assist, acute therapy may be able to assist her in returning home with [facility] therapies. OT will follow acutely.      Recommendations for follow up therapy are one component of a multi-disciplinary discharge planning process, led by the attending physician.  Recommendations may be updated based on patient status, additional functional criteria and insurance authorization.   Follow Up Recommendations  Skilled nursing-short term rehab (<3 hours/day)    Assistance Recommended at Discharge Frequent or constant Supervision/Assistance  Patient can return home with the following Two people to help with walking and/or transfers;Two people to help with bathing/dressing/bathroom;Assistance with cooking/housework;Direct supervision/assist  for medications management;Assist for transportation;Help with stairs or ramp for entrance    Functional Status Assessment  Patient has had a recent decline in their functional status and demonstrates the ability to make significant improvements in function in a reasonable and predictable amount of time.  Equipment Recommendations  None recommended by OT    Recommendations for Other Services       Precautions / Restrictions Precautions Precautions: Fall Precaution Comments: monitor for concussion symptoms, BP Restrictions Weight Bearing Restrictions: No      Mobility Bed Mobility Overal bed mobility: Needs Assistance Bed Mobility: Sidelying to Sit, Sit to Sidelying, Rolling Rolling: Min assist Sidelying to sit: Mod assist     Sit to sidelying: Mod assist General bed mobility comments: mod to support trunk up from bed and mod to return to bed due to pain and weakness on RLE    Transfers Overall transfer level: Needs assistance                 General transfer comment: Pt attempted to stand, unable to tolerate anyweight on RLE      Balance Overall balance assessment: Needs assistance, History of Falls Sitting-balance support: Feet supported, Bilateral upper extremity supported Sitting balance-Leahy Scale: Fair                                     ADL either performed or assessed with clinical judgement   ADL Overall ADL's : Needs assistance/impaired Eating/Feeding: Set up;Sitting   Grooming: Set up;Sitting   Upper Body Bathing: Minimal assistance;Sitting   Lower Body  Bathing: Maximal assistance;Sitting/lateral leans;Sit to/from stand   Upper Body Dressing : Minimal assistance;Sitting   Lower Body Dressing: Maximal assistance;Sitting/lateral leans;Sit to/from stand   Toilet Transfer: Total assistance;+2 for physical assistance;+2 for safety/equipment;Stand-pivot   Toileting- Clothing Manipulation and Hygiene: Maximal  assistance;Sitting/lateral lean;Sit to/from stand         General ADL Comments: Pt unable to come to stand at this time, limited by increased pain in RLE.     Vision Baseline Vision/History: 0 No visual deficits Ability to See in Adequate Light: 0 Adequate Patient Visual Report: No change from baseline Vision Assessment?: No apparent visual deficits     Perception     Praxis      Pertinent Vitals/Pain Pain Assessment Pain Assessment: Faces Faces Pain Scale: Hurts even more Pain Location: R knee, ankle, and foot - unable to weight bear Pain Descriptors / Indicators: Guarding, Grimacing Pain Intervention(s): Limited activity within patient's tolerance, Monitored during session, Repositioned     Hand Dominance Right   Extremity/Trunk Assessment Upper Extremity Assessment Upper Extremity Assessment: Generalized weakness   Lower Extremity Assessment Lower Extremity Assessment: Defer to PT evaluation   Cervical / Trunk Assessment Cervical / Trunk Assessment: Kyphotic   Communication Communication Communication: No difficulties   Cognition Arousal/Alertness: Awake/alert Behavior During Therapy: WFL for tasks assessed/performed Overall Cognitive Status: Within Functional Limits for tasks assessed                                 General Comments: able to give history details     General Comments  VSS on RA    Exercises     Shoulder Instructions      Home Living Family/patient expects to be discharged to:: Private residence Living Arrangements: Spouse/significant other Available Help at Discharge: Family;Available 24 hours/day Type of Home: House Home Access: Stairs to enter [employer] of Steps: 2 Entrance Stairs-Rails: Right;Left Home Layout: Multi-level Alternate Level Stairs-Number of Steps: 13 Alternate Level Stairs-Rails: Right Bathroom Shower/Tub: Occupational psychologist: Standard     Home Equipment: Cane - single  point;Grab bars - toilet;Grab bars - tub/shower   Additional Comments: pt reports she has been using hurry cane more recently but cannot stand on joints on R knee and ankle      Prior Functioning/Environment Prior Level of Function : Independent/Modified Independent             Mobility Comments: used SPC with no issues ADLs Comments: I with self care        OT Problem List: Decreased strength;Decreased range of motion;Decreased activity tolerance;Impaired balance (sitting and/or standing);Decreased safety awareness;Obesity;Pain      OT Treatment/Interventions: Self-care/ADL training;Therapeutic exercise;Energy conservation;DME and/or AE instruction;Therapeutic activities;Patient/family education;Balance training    OT Goals(Current goals can be found in the care plan section) Acute Rehab OT Goals Patient Stated Goal: To go home OT Goal Formulation: With patient Time For Goal Achievement: 12/11/21 Potential to Achieve Goals: Good ADL Goals Pt Will Perform Lower Body Bathing: with mod assist;sitting/lateral leans;sit to/from stand Pt Will Perform Lower Body Dressing: with mod assist;sitting/lateral leans;sit to/from stand Pt Will Transfer to Toilet: with mod assist;stand pivot transfer Pt Will Perform Toileting - Clothing Manipulation and hygiene: with mod assist;sitting/lateral leans;sit to/from stand Additional ADL Goal #1: Pt will complete bed mobility with mod I as precursor to seated ADL's.  OT Frequency: Min 2X/week    Co-evaluation  AM-PAC OT "6 Clicks" Daily Activity     Outcome Measure Help from another person eating meals?: A Little Help from another person taking care of personal grooming?: A Little Help from another person toileting, which includes using toliet, bedpan, or urinal?: Total Help from another person bathing (including washing, rinsing, drying)?: A Lot Help from another person to put on and taking off regular upper body clothing?: A  Little Help from another person to put on and taking off regular lower body clothing?: A Lot 6 Click Score: 14   End of Session Equipment Utilized During Treatment: Gait belt;Rolling walker (2 wheels) Nurse Communication: Mobility status  Activity Tolerance: Patient limited by pain Patient left: in bed;with call bell/phone within reach;with bed alarm set  OT Visit Diagnosis: Unsteadiness on feet (R26.81);Other abnormalities of gait and mobility (R26.89);Muscle weakness (generalized) (M62.81);Pain Pain - Right/Left: Right Pain - part of body: Ankle and joints of foot;Knee                Time: 0093-8182 OT Time Calculation (min): 38 min Charges:  OT General Charges $OT Visit: 1 Visit OT Evaluation $OT Eval Moderate Complexity: 1 Mod OT Treatments $Self Care/Home Management : 8-22 mins $Therapeutic Activity: 8-22 mins  Alaynah Schutter H., OTR/L Acute Rehabilitation  Tabor Bartram Elane Marcoantonio Legault 11/27/2021, 1:02 PM

## 2021-11-27 NOTE — Progress Notes (Signed)
Patient Magnesium level this morning is 0.9. MD made aware and 4g Mag order was placed.  ?

## 2021-11-28 LAB — CBC WITH DIFFERENTIAL/PLATELET
Abs Immature Granulocytes: 0.17 10*3/uL — ABNORMAL HIGH (ref 0.00–0.07)
Basophils Absolute: 0 10*3/uL (ref 0.0–0.1)
Basophils Relative: 0 %
Eosinophils Absolute: 0.2 10*3/uL (ref 0.0–0.5)
Eosinophils Relative: 1 %
HCT: 36.3 % (ref 36.0–46.0)
Hemoglobin: 13.2 g/dL (ref 12.0–15.0)
Immature Granulocytes: 1 %
Lymphocytes Relative: 9 %
Lymphs Abs: 1.7 10*3/uL (ref 0.7–4.0)
MCH: 31.9 pg (ref 26.0–34.0)
MCHC: 36.4 g/dL — ABNORMAL HIGH (ref 30.0–36.0)
MCV: 87.7 fL (ref 80.0–100.0)
Monocytes Absolute: 1.7 10*3/uL — ABNORMAL HIGH (ref 0.1–1.0)
Monocytes Relative: 9 %
Neutro Abs: 15 10*3/uL — ABNORMAL HIGH (ref 1.7–7.7)
Neutrophils Relative %: 80 %
Platelets: 205 10*3/uL (ref 150–400)
RBC: 4.14 MIL/uL (ref 3.87–5.11)
RDW: 14.2 % (ref 11.5–15.5)
WBC: 18.9 10*3/uL — ABNORMAL HIGH (ref 4.0–10.5)
nRBC: 0 % (ref 0.0–0.2)

## 2021-11-28 LAB — GLUCOSE, CAPILLARY
Glucose-Capillary: 94 mg/dL (ref 70–99)
Glucose-Capillary: 141 mg/dL — ABNORMAL HIGH (ref 70–99)
Glucose-Capillary: 250 mg/dL — ABNORMAL HIGH (ref 70–99)
Glucose-Capillary: 292 mg/dL — ABNORMAL HIGH (ref 70–99)

## 2021-11-28 LAB — MAGNESIUM: Magnesium: 1.6 mg/dL — ABNORMAL LOW (ref 1.7–2.4)

## 2021-11-28 LAB — BASIC METABOLIC PANEL
Anion gap: 10 (ref 5–15)
BUN: 17 mg/dL (ref 8–23)
CO2: 28 mmol/L (ref 22–32)
Calcium: 9.2 mg/dL (ref 8.9–10.3)
Chloride: 94 mmol/L — ABNORMAL LOW (ref 98–111)
Creatinine, Ser: 1.26 mg/dL — ABNORMAL HIGH (ref 0.44–1.00)
GFR, Estimated: 46 mL/min — ABNORMAL LOW (ref >60)
Glucose, Bld: 162 mg/dL — ABNORMAL HIGH (ref 70–99)
Potassium: 3.5 mmol/L (ref 3.5–5.1)
Sodium: 132 mmol/L — ABNORMAL LOW (ref 135–145)

## 2021-11-28 MED ORDER — CLOPIDOGREL BISULFATE 75 MG PO TABS
75.0000 mg | ORAL_TABLET | Freq: Every day | ORAL | Status: DC
  Administered 2021-11-29 – 2021-11-30 (×2): 75 mg via ORAL
  Filled 2021-11-28 (×2): qty 1

## 2021-11-28 MED ORDER — MAGNESIUM SULFATE 4 GM/100ML IV SOLN
4.0000 g | Freq: Once | INTRAVENOUS | Status: AC
  Administered 2021-11-28: 4 g via INTRAVENOUS
  Filled 2021-11-28: qty 100

## 2021-11-28 NOTE — [place] FL2 (Signed)
?[place] MEDICAID FL2 LEVEL OF CARE SCREENING TOOL  ?  ? ?IDENTIFICATION  ?Patient Name: ?Kristine Gray Birthdate: March 17, 1951 Sex: female Admission Date (Current Location): ?11/26/2021  ?[place] and [place] Number: ? [place] ?  Facility and Address:  ?[facility], [place] [address], [place], [place] [address] ?     Provider Number: ?[id number]  ?Attending Physician Name and Address:  ?Eugenie Filler, MD ? Relative Name and Phone Number:  ?Estelene Carmack [phone] ?   ?Current Level of Care: ?Hospital Recommended Level of Care: ?[place] Prior Approval Number: ?  ? ?Date Approved/Denied: ?  PASRR Number: ?[id number] ? ?Discharge Plan: ?SNF ?  ? ?Current Diagnoses: ?Patient Active Problem List  ? Diagnosis Date Noted  ? Hypomagnesemia 11/27/2021  ? Gout attack 11/27/2021  ? Near syncope 11/26/2021  ? UTI (urinary tract infection) 11/26/2021  ? TIA (transient ischemic attack) 07/14/2020  ? Pyelonephritis 03/19/2012  ? DM2 (diabetes mellitus, type 2) ([place]) 03/19/2012  ? HTN (hypertension) 03/19/2012  ? Hyperlipidemia 03/19/2012  ? Renal failure 03/19/2012  ? Sepsis(995.91) 03/19/2012  ? ? ?Orientation RESPIRATION BLADDER Height & Weight   ?  ?Self, Time, Situation, Place ? Normal Continent, External catheter Weight: 274 lb 11.1 oz (124.6 kg) ?Height:  '5\' 4"'$  (162.6 cm)  ?BEHAVIORAL SYMPTOMS/MOOD NEUROLOGICAL BOWEL NUTRITION STATUS  ?    Continent Diet (See DC summary)  ?AMBULATORY STATUS COMMUNICATION OF NEEDS Skin   ?Extensive Assist Verbally Skin abrasions (R knee, Bilateral Arm, Eye, and Face abraisions, Face bruising) ?  ?  ?  ?    ?     ?     ? ? ?Personal Care Assistance Level of Assistance  ?Bathing, Feeding, Dressing Bathing Assistance: Maximum assistance ?Feeding assistance: Limited assistance ?Dressing Assistance: Maximum assistance ?   ? ?Functional Limitations Info  ?Sight, Hearing, Speech Sight Info: Adequate ?Hearing Info: Adequate ?Speech Info: Adequate   ? ? ?SPECIAL CARE FACTORS FREQUENCY  ?PT (By licensed PT), OT (By licensed OT)   ?  ?PT Frequency: 5x week ?OT Frequency: 5x week ?  ?  ?  ?   ? ? ?Contractures Contractures Info: Not present  ? ? ?Additional Factors Info  ?Code Status, Allergies, Insulin Sliding Scale Code Status Info: Full ?Allergies Info: Penicillins   Clindamycin/lincomycin   Dapagliflozin   Valsartan ?  ?Insulin Sliding Scale Info: Insulin Aspart (Novolog) 0-9 U 3x daily w/ meals, Humulin R 50 U 3x daily w/ meals ?  ?   ? ?Current Medications (11/28/2021):  This is the current hospital active medication list ?Current Facility-Administered Medications  ?Medication Dose Route Frequency Provider Last Rate Last Admin  ? acetaminophen (TYLENOL) tablet 650 mg  650 mg Oral Q6H PRN Rise Patience, MD   650 mg at 11/27/21 2124  ? Or  ? acetaminophen (TYLENOL) suppository 650 mg  650 mg Rectal Q6H PRN Rise Patience, MD      ? allopurinol (ZYLOPRIM) tablet 100 mg  100 mg Oral Daily Rise Patience, MD   100 mg at 11/28/21 3354  ? atorvastatin (LIPITOR) tablet 80 mg  80 mg Oral QPM Rise Patience, MD   80 mg at 11/27/21 1707  ? cefTRIAXone (ROCEPHIN) 2 g in sodium chloride 0.9 % 100 mL IVPB  2 g Intravenous Q24H Eugenie Filler, MD 200 mL/hr at 11/28/21 1247 2 g at 11/28/21 1247  ? [START ON 11/29/2021] clopidogrel (PLAVIX) tablet 75 mg  75 mg Oral  Daily Eugenie Filler, MD      ? cyclobenzaprine (FLEXERIL) tablet 10 mg  10 mg Oral Daily Rise Patience, MD   10 mg at 11/28/21 7209  ? hydrALAZINE (APRESOLINE) injection 10 mg  10 mg Intravenous Q4H PRN Rise Patience, MD   10 mg at 11/27/21 1747  ? insulin aspart (novoLOG) injection 0-9 Units  0-9 Units Subcutaneous TID WC Rise Patience, MD   1 Units at 11/28/21 1219  ? insulin regular human CONCENTRATED (HUMULIN R) 500 UNIT/ML KwikPen 50 Units  50 Units Subcutaneous TID with meals Rise Patience, MD   50 Units at 11/28/21 1219  ? lactated ringers infusion    Intravenous Continuous Eugenie Filler, MD 100 mL/hr at 11/28/21 0841 Rate Change at 11/28/21 0841  ? magnesium oxide (MAG-OX) tablet 400 mg  400 mg Oral BID Eugenie Filler, MD   400 mg at 11/28/21 0845  ? nebivolol (BYSTOLIC) tablet 10 mg  10 mg Oral BID Eugenie Filler, MD   10 mg at 11/28/21 4709  ? predniSONE (DELTASONE) tablet 40 mg  40 mg Oral QAC breakfast Eugenie Filler, MD   40 mg at 11/28/21 6283  ? vitamin B-12 (CYANOCOBALAMIN) tablet 5,000 mcg  5,000 mcg Oral Daily Rise Patience, MD   5,000 mcg at 11/28/21 6629  ? ? ? ?Discharge Medications: ?Please see discharge summary for a list of discharge medications. ? ?Relevant Imaging Results: ? ?Relevant Lab Results: ? ? ?Additional Information ?SS# [id number] ? ?Coralee Pesa, LCSWA ? ? ? ? ?

## 2021-11-28 NOTE — Progress Notes (Addendum)
PROGRESS NOTE    Kristine Gray  OZD:[id number] DOB: 21-Jan-1951 DOA: 11/26/2021 PCP: Bernerd Limbo, MD    Chief Complaint  Patient presents with   Fall    Brief Narrative:  Patient 71 year old female prior history of TIA, type 2 diabetes, hypertension, CKD stage III brought to the ED as she had difficulty getting up from the commode.  Patient's husband checked her blood pressure noted to be hypotensive with systolic in the 25Z.  Patient noted the day prior to admission had a fall hit her face and sustained bruises on the scalp and around her eyes.  Patient also noted to have some episodes of nausea and vomiting.  Patient brought by EMS to the ED noted to have systolic blood pressures in the 100s improved with IV fluids.  Urinalysis concerning for UTI.  CT head and CT maxillofacial showing large scalp hematoma, periorbital hematoma.  CT head unremarkable.  CT C-spine negative.  Plain films negative for any fracture.  Patient placed on IV antibiotics, IV fluids and admitted for further evaluation and management.   Assessment & Plan:   Principal Problem:   Near syncope Active Problems:   DM2 (diabetes mellitus, type 2) (HCC)   HTN (hypertension)   Hyperlipidemia   TIA (transient ischemic attack)   UTI (urinary tract infection)   Hypomagnesemia   Gout attack  #1 near syncopal episode -Likely multifactorial secondary to dehydration from emesis, poor oral intake after full in the setting of probable UTI and diuretics. -Blood pressure responded to IV fluids. -CT head negative for any acute intracranial abnormalities. -Patient pancultured with urine cultures and blood cultures pending.  -COVID-19 PCR negative. -Blood cultures pending.  Urine cultures negative.  Decrease IV fluids to 100 cc an hour.   -Continue IV Rocephin for another 24 hours and transition to oral antibiotics to complete a 5-day course of treatment.   -PT/OT following.  2.  UTI -Urine cultures with no growth to  date.   -Patient afebrile, improving clinically.   -Continue IV Rocephin through today and transition to oral Vantin to complete a 5-day course of empiric antibiotic treatment.    3.  Dehydration -IV fluids.  4.  Hypertension -Continue to hold HCTZ. -Blood pressure improving.   -Continue home regimen Bystolic.   -Hydralazine as needed.    5.  Diabetes mellitus type 2 -Hemoglobin A1c 8.1 (11/26/2021) -Patient noted on insulin U-500. -Per RN patient noted to have some bouts of hypoglycemia at home and as such U-500 has been decreased to 50 units 3 times daily.  -Diabetic coordinator consulted.  6.  Nausea/vomiting -Likely secondary to concussion. -Abdomen benign. -Clinical improvement -IV fluids, IV antiemetics, supportive care.  7.  History of TIA -Patient noted to be on Plavix however currently on hold due to large scalp and periorbital hematoma. -Could likely trial of resumption of Plavix tomorrow. -PT/OT.  8.  CKD stage IIIb -Currently at baseline. -Decrease IV fluids to 100 cc/h.  9. mild hyponatremia -Likely secondary to hypovolemic hyponatremia in the setting of HCTZ. -HCTZ on hold.   -Hyponatremia improved with hydration.  10.  Scalp hematoma status post fall -Continue warm/cold compresses. -Continue to hold Plavix and could likely resume tomorrow..  11.  Acute gouty attack -Patient with complaints of gout attack in the right ankle which is exquisitely tender to palpation with some warmth but no erythema on 11/27/2021. -Improved clinically after IV Solu-Medrol 60 mg x 1 on 11/27/2021. -Continue oral prednisone 40 mg daily for the next 4  to 5 days. -Outpatient follow-up with PCP.  12.  Severe hypomagnesemia -Magnesium at 0.9 >>> 1.6 -Magnesium sulfate 4 g IV x1.   -Repeat labs in the AM.  13.  Leukocytosis -Likely multifactorial secondary to UTI, current steroid treatment.  Patient afebrile. -Urine cultures negative. -Blood cultures with no growth to  date. -Continue empiric IV antibiotics.    DVT prophylaxis: SCDs Code Status: Full Family Communication: Updated patient.  No family at bedside. Disposition: SNF versus home with home health.  Status is: Inpatient          Consultants:  None  Procedures:  CT head 11/26/2021 CT maxillofacial 11/26/2021 CT C-spine 11/26/2021 Chest x-ray 11/26/2021 Plain films of the right ankle 11/26/2021 Plan.  The left forearm 11/26/2021 Plain films of the right knee 11/26/2021 Plain films of the pelvis 11/26/2021 Plain films of the right shoulder 11/26/2021  Antimicrobials:  IV Rocephin 11/26/2021>>>>   Subjective: Patient laying in bed.  States improvement with dizziness and lightheadedness.  Improvement with right ankle pain.  No chest pain.  No shortness of breath.  Overall feeling better.   Objective: Vitals:   11/27/21 1845 11/27/21 2100 11/28/21 0401 11/28/21 0913  BP: (!) 160/68 (!) 149/75 132/67 129/75  Pulse: 83 84 66 66  Resp:  '18 17 16  '$ Temp:  98.9 F (37.2 C) 97.7 F (36.5 C) (!) 97.5 F (36.4 C)  TempSrc:  Oral  Oral  SpO2:  93% 95% 98%  Weight:      Height:        Intake/Output Summary (Last 24 hours) at 11/28/2021 1012 Last data filed at 11/28/2021 0601 Gross per 24 hour  Intake 3583.58 ml  Output 1500 ml  Net 2083.58 ml    Filed Weights   11/26/21 0043 11/26/21 1610 11/26/21 2006  Weight: 122.9 kg 124.1 kg 124.6 kg    Examination:  General exam: NAD.  Hematoma noted on the left head.  Ecchymosis noted around eyes. Respiratory system: Lungs clear to auscultation bilaterally.  No wheezes, no crackles, no rhonchi.  Speaking in full sentences.  Fair air movement.   Cardiovascular system: RRR no murmurs rubs or gallops.  No JVD.  No lower extremity edema. Gastrointestinal system: Abdomen is soft, nontender, nondistended, obese, positive bowel sounds.  No rebound.  No guarding.  Central nervous system: Alert and oriented. No focal neurological deficits. Extremities:  Right ankle decreased warmth, less tender to palpation.  Skin: No rashes, lesions or ulcers Psychiatry: Judgement and insight appear normal. Mood & affect appropriate.     Data Reviewed: I have personally reviewed following labs and imaging studies  CBC: Recent Labs  Lab 11/26/21 0057 11/26/21 0356 11/27/21 0239 11/28/21 0220  WBC 12.8* 13.0* 15.7* 18.9*  NEUTROABS 8.5*  --  11.3* 15.0*  HGB 13.9 13.6 13.2 13.2  HCT 40.1 40.7 37.9 36.3  MCV 91.3 91.3 89.2 87.7  PLT 235 225 235 205     Basic Metabolic Panel: Recent Labs  Lab 11/26/21 0057 11/26/21 0356 11/27/21 0239 11/28/21 0220  NA 130* 131* 132* 132*  K 4.2 3.7 3.8 3.5  CL 90* 91* 93* 94*  CO2 '26 24 28 28  '$ GLUCOSE 258* 233* 164* 162*  BUN '17 16 15 17  '$ CREATININE 1.88* 1.71* 1.43* 1.26*  CALCIUM 9.0 9.0 8.9 9.2  MG  --   --  0.9* 1.6*     GFR: Estimated Creatinine Clearance: 53.5 mL/min (A) (by C-G formula based on SCr of 1.26 mg/dL (H)).  Liver Function  Tests: Recent Labs  Lab 11/26/21 0057  AST 25  ALT 21  ALKPHOS 70  BILITOT 1.4*  PROT 6.6  ALBUMIN 3.6     CBG: Recent Labs  Lab 11/27/21 0734 11/27/21 1120 11/27/21 1627 11/27/21 2101 11/28/21 0721  GLUCAP 207* 238* 267* 234* 94      Recent Results (from the past 240 hour(s))  Resp Panel by RT-PCR (Flu A&B, Covid) Nasopharyngeal Swab     Status: None   Collection Time: 11/26/21  5:52 AM   Specimen: Nasopharyngeal Swab; Nasopharyngeal(NP) swabs in vial transport medium  Result Value Ref Range Status   SARS Coronavirus 2 by RT PCR NEGATIVE NEGATIVE Final    Comment: (NOTE) SARS-CoV-2 target nucleic acids are NOT DETECTED.  The SARS-CoV-2 RNA is generally detectable in upper respiratory specimens during the acute phase of infection. The lowest concentration of SARS-CoV-2 viral copies this assay can detect is 138 copies/mL. A negative result does not preclude SARS-Cov-2 infection and should not be used as the sole basis for treatment  or other patient management decisions. A negative result may occur with  improper specimen collection/handling, submission of specimen other than nasopharyngeal swab, presence of viral mutation(s) within the areas targeted by this assay, and inadequate number of viral copies(<138 copies/mL). A negative result must be combined with clinical observations, patient history, and epidemiological information. The expected result is Negative.  Fact Sheet for Patients:  EntrepreneurPulse.com.au  Fact Sheet for Healthcare Providers:  IncredibleEmployment.be  This test is no t yet approved or cleared by the [place] FDA and  has been authorized for detection and/or diagnosis of SARS-CoV-2 by FDA under an Emergency Use Authorization (EUA). This EUA will remain  in effect (meaning this test can be used) for the duration of the COVID-19 declaration under Section 564(b)(1) of the Act, 21 U.S.C.section 360bbb-3(b)(1), unless the authorization is terminated  or revoked sooner.       Influenza A by PCR NEGATIVE NEGATIVE Final   Influenza B by PCR NEGATIVE NEGATIVE Final    Comment: (NOTE) The Xpert Xpress SARS-CoV-2/FLU/RSV plus assay is intended as an aid in the diagnosis of influenza from Nasopharyngeal swab specimens and should not be used as a sole basis for treatment. Nasal washings and aspirates are unacceptable for Xpert Xpress SARS-CoV-2/FLU/RSV testing.  Fact Sheet for Patients: EntrepreneurPulse.com.au  Fact Sheet for Healthcare Providers: IncredibleEmployment.be  This test is not yet approved or cleared by the [place] FDA and has been authorized for detection and/or diagnosis of SARS-CoV-2 by FDA under an Emergency Use Authorization (EUA). This EUA will remain in effect (meaning this test can be used) for the duration of the COVID-19 declaration under Section 564(b)(1) of the Act, 21 U.S.C. section  360bbb-3(b)(1), unless the authorization is terminated or revoked.  Performed at [facility], [place] [address] [place], [place] [address]   Urine Culture     Status: None   Collection Time: 11/26/21 10:17 AM   Specimen: Urine, Catheterized  Result Value Ref Range Status   Specimen Description URINE, CATHETERIZED  Final   Special Requests NONE  Final   Culture   Final    NO GROWTH Performed at [facility], [address] [place], [place] [address]    Report Status 11/27/2021 FINAL  Final  Culture, blood (Routine X 2) w Reflex to ID Panel     Status: None (Preliminary result)   Collection Time: 11/26/21 10:54 AM   Specimen: BLOOD RIGHT HAND  Result Value  Ref Range Status   Specimen Description BLOOD RIGHT HAND  Final   Special Requests   Final    BOTTLES DRAWN AEROBIC AND ANAEROBIC Blood Culture results may not be optimal due to an inadequate volume of blood received in culture bottles   Culture   Final    NO GROWTH 2 DAYS Performed at [facility], [place] [address], [place], [place] [address]    Report Status PENDING  Incomplete  Culture, blood (Routine X 2) w Reflex to ID Panel     Status: None (Preliminary result)   Collection Time: 11/26/21  4:14 PM   Specimen: BLOOD  Result Value Ref Range Status   Specimen Description BLOOD SITE NOT SPECIFIED  Final   Special Requests   Final    BOTTLES DRAWN AEROBIC ONLY Blood Culture results may not be optimal due to an inadequate volume of blood received in culture bottles   Culture   Final    NO GROWTH 2 DAYS Performed at [facility], [place] [address], [place], [place] [address]    Report Status PENDING  Incomplete          Radiology Studies: No results found.      Scheduled Meds:  allopurinol  100 mg Oral Daily   atorvastatin  80 mg Oral QPM   cyclobenzaprine  10 mg Oral Daily   insulin aspart  0-9 Units Subcutaneous TID WC   insulin regular human CONCENTRATED  50 Units Subcutaneous TID with  meals   magnesium oxide  400 mg Oral BID   nebivolol  10 mg Oral BID   predniSONE  40 mg Oral QAC breakfast   vitamin B-12  5,000 mcg Oral Daily   Continuous Infusions:  cefTRIAXone (ROCEPHIN)  IV Stopped (11/27/21 0962)   lactated ringers 100 mL/hr at 11/28/21 0841   magnesium sulfate bolus IVPB 4 g (11/28/21 0840)     LOS: 1 day    Time spent: 35 minutes.    Irine Seal, MD [facility]   To contact the attending provider between 7A-7P or the covering provider during after hours 7P-7A, please log into the web site [email or web address] and access using universal [place] password for that web site. If you do not have the password, please call the hospital operator.  11/28/2021, 10:12 AM

## 2021-11-28 NOTE — Progress Notes (Signed)
Physical Therapy Treatment ?Patient Details ?Name: Kristine Gray ?MRN: [id number] ?DOB: 10/12/1950 ?Today's Date: 11/28/2021 ? ? ?History of Present Illness 71 yo female with onset of dizziness and near syncope was noted to have previously had a  fall at home, returned home yesterday from ED.  Now brought to hosp on 3/4, after recent fall was cleared for head and neck fractures but UTI suspected.  Has nephrostomy site, L flank.  Current concern after striking head of concussion, with mult episodes of N&V and husband having recorded low BP at home.  PMHx:  Falls, TIA, DM, HTN, CKD3, low BP ? ?  ?PT Comments  ? ? Continuing work on functional mobility and activity tolerance;  Obtained orthostatics, and while there was an 18 mmHg drop in SBP from lying to standing, the standing at 3 minutes BP rallied (See vitals flow sheet.); R foot and ankel pain limited walking today, but [name]; Good progress; [name]  ?Recommendations for follow up therapy are one component of a multi-disciplinary discharge planning process, led by the attending physician.  Recommendations may be updated based on patient status, additional functional criteria and insurance authorization. ? ?Follow Up Recommendations ? Skilled nursing-short term rehab (<3 hours/day) ?  ?  ?Assistance Recommended at Discharge Frequent or constant Supervision/Assistance  ?Patient can return home with the following A lot of help with walking and/or transfers;A little help with bathing/dressing/bathroom;Assistance with cooking/housework;Assist for transportation;Help with stairs or ramp for entrance ?  ?Equipment Recommendations ? Rolling walker (2 wheels)  ?  ?Recommendations for Other Services   ? ? ?  ?Precautions / Restrictions Precautions ?Precautions: Fall ?Precaution Comments: monitor for concussion symptoms, BP  ?  ? ?Mobility ? Bed  Mobility ?Overal bed mobility: Needs Assistance ?Bed Mobility: Supine to Sit ?  ?  ?Supine to sit: Min guard ?  ?  ?General bed mobility comments: Incr time and minguard assist fo rsafety ?  ? ?Transfers ?Overall transfer level: Needs assistance ?Equipment used: Rolling walker (2 wheels) ?Transfers: Sit to/from Stand ?Sit to Stand: Min assist ?  ?  ?  ?  ?  ?General transfer comment: Mod assist to steady RW; will need reinforcement of hand position ?  ? ?Ambulation/Gait ?Ambulation/Gait assistance: Min assist ?Gait Distance (Feet): 4 Feet ?Assistive device: Rolling walker (2 wheels) ?Gait Pattern/deviations: Decreased step length - left, Decreased stance time - right ?  ?  ?  ?General Gait Details: Antalgic and dependent on UE support ? ? ?Stairs ?  ?  ?  ?  ?  ? ? ?Wheelchair Mobility ?  ? ?Modified Rankin (Stroke Patients Only) ?  ? ? ?  ?Balance   ?  ?Sitting balance-Kristine Gray Scale: Fair ?  ?  ?  ?Standing balance-Kristine Gray Scale: Poor ?  ?  ?  ?  ?  ?  ?  ?  ?  ?  ?  ?  ?  ? ?  ?Cognition Arousal/Alertness: Awake/alert ?Behavior During Therapy: [facility] for tasks assessed/performed ?Overall Cognitive Status: Within Functional Limits for tasks assessed ?  ?  ?  ?  ?  ?  ?  ?  ?  ?  ?  ?  ?  ?  ?  ?  ?  ?  ?  ? ?  ?Exercises   ? ?  ?General Comments General comments (skin  integrity, edema, etc.): Orthostatic BPs obtained, and did not show a significant BP drop; no dizziness reported today ?  ?  ? ?Pertinent Vitals/Pain Pain Assessment ?Pain Assessment: 0-10 ?Pain Score: 4  ?Pain Location: R knee, ankle, and foot ?Pain Descriptors / Indicators: Guarding, Grimacing ?Pain Intervention(s): Monitored during session  ? ? ?Home Living   ?  ?  ?  ?  ?  ?  ?  ?  ?  ?   ?  ?Prior Function    ?  ?  ?   ? ?PT Goals (current goals can now be found in the care plan section) Acute Rehab PT Goals ?Patient Stated Goal: to go home with husband ASAP ?PT Goal Formulation: With patient ?Time For Goal Achievement: 12/10/21 ?Potential to Achieve  Goals: Good ?Progress towards PT goals: Progressing toward goals ? ?  ?Frequency ? ? ? Min 3X/week ? ? ? ?  ?PT Plan Current plan remains appropriate;Equipment recommendations need to be updated (will also monitor fo rprogress; may progress well enough to be able to dc home)  ? ? ?Co-evaluation   ?  ?  ?  ?  ? ?  ?AM-PAC PT "6 Clicks" Mobility   ?Outcome Measure ? Help needed turning from your back to your side while in a flat bed without using bedrails?: A Little ?Help needed moving from lying on your back to sitting on the side of a flat bed without using bedrails?: A Little ?Help needed moving to and from a bed to a chair (including a wheelchair)?: A Little ?Help needed standing up from a chair using your arms (e.g., wheelchair or bedside chair)?: A Little ?Help needed to walk in hospital room?: A Lot ?Help needed climbing 3-5 steps with a railing? : A Lot ?6 Click Score: 16 ? ?  ?End of Session   ?Activity Tolerance: Patient tolerated treatment well ?Patient left: in chair;with call bell/phone within reach ?Nurse Communication: Mobility status ?PT Visit Diagnosis: Muscle weakness (generalized) (M62.81);Pain;History of falling (Z91.81);Difficulty in walking, not elsewhere classified (R26.2) ?Pain - Right/Left: Right ?Pain - part of body: Knee;Leg;Ankle and joints of foot ?  ? ? ?Time: 1305-1330 ?PT Time Calculation (min) (ACUTE ONLY): 25 min ? ?Charges:  $Therapeutic Activity: 23-37 mins          ?          ? ?Roney Marion, PT  ?Acute Rehabilitation Services ?Pager [phone] ?Office [phone] ? ? ? ?[name] ?11/28/2021, 4:19 PM ? ?

## 2021-11-28 NOTE — TOC Initial Note (Signed)
Transition of Care (TOC) - Initial/Assessment Note  ? ? ?Patient Details  ?Name: Kristine Gray ?MRN: [id number] ?Date of Birth: 1951-08-04 ? ?Transition of Care (TOC) CM/SW Contact:    ?Coralee Pesa, LCSWA ?Phone Number: ?11/28/2021, 12:38 PM ? ?Clinical Narrative:                 ?CSW met with pt at bedside to discuss SNF recommendation. Pt states she is agreeable and has discussed it with her spouse. She has no preference, but does live near [facility] Pt has had covid vaccines with 3 boosters. Pt stated she would update her husband. CSW will send out referral's and TOC will continue to follow for DC needs. ? ?Expected Discharge Plan: [place] ?Barriers to Discharge: Ship broker, Continued Medical Work up, SNF Pending bed offer ? ? ?Patient Goals and CMS Choice ?Patient states their goals for this hospitalization and ongoing recovery are:: Pt states her goal is to be able to get home more independently. ?CMS Medicare.gov Compare Post Acute Care list provided to:: Patient ?Choice offered to / list presented to : Patient ? ?Expected Discharge Plan and Services ?Expected Discharge Plan: [place] ?  ?  ?Post Acute Care Choice: [place] ?Living arrangements for the past 2 months: [place] ?                ?  ?  ?  ?  ?  ?  ?  ?  ?  ?  ? ?Prior Living Arrangements/Services ?Living arrangements for the past 2 months: [place] ?Lives with:: Spouse ?Patient language and need for interpreter reviewed:: No ?Do you feel safe going back to the place where you live?: Yes      ?Need for Family Participation in Patient Care: Yes (Comment) ?Care giver support system in place?: No (comment) ?  ?Criminal Activity/Legal Involvement Pertinent to Current Situation/Hospitalization: No - Comment as needed ? ?Activities of Daily Living ?Home Assistive Devices/Equipment: Kasandra Knudsen (specify quad or straight), Grab bars in shower ?ADL Screening (condition at time  of admission) ?Patient's cognitive ability adequate to safely complete daily activities?: Yes ?Is the patient deaf or have difficulty hearing?: No ?Does the patient have difficulty seeing, even when wearing glasses/contacts?: No ?Does the patient have difficulty concentrating, remembering, or making decisions?: No ?Patient able to express need for assistance with ADLs?: Yes ?Does the patient have difficulty dressing or bathing?: No ?Independently performs ADLs?: Yes (appropriate for developmental age) ?Does the patient have difficulty walking or climbing stairs?: Yes ?Weakness of Legs: Right ?Weakness of Arms/Hands: None ? ?Permission Sought/Granted ?Permission sought to share information with : Family Supports ?Permission granted to share information with : No ?   ?   ?   ?   ? ?Emotional Assessment ?Appearance:: Appears stated age ?Attitude/Demeanor/Rapport: Engaged ?Affect (typically observed): Appropriate ?Orientation: : Oriented to Self, Oriented to Place, Oriented to  Time, Oriented to Situation ?Alcohol / Substance Use: Not Applicable ?Psych Involvement: No (comment) ? ?Admission diagnosis:  Fall [W19.XXXA] ?Near syncope [R55] ?Urinary tract infection without hematuria, site unspecified [N39.0] ?Syncope, unspecified syncope type [R55] ?Patient Active Problem List  ? Diagnosis Date Noted  ? Hypomagnesemia 11/27/2021  ? Gout attack 11/27/2021  ? Near syncope 11/26/2021  ? UTI (urinary tract infection) 11/26/2021  ? TIA (transient ischemic attack) 07/14/2020  ? Pyelonephritis 03/19/2012  ? DM2 (diabetes mellitus, type 2) ([place]) 03/19/2012  ? HTN (hypertension) 03/19/2012  ? Hyperlipidemia 03/19/2012  ? Renal  failure 03/19/2012  ? Sepsis(995.91) 03/19/2012  ? ?PCP:  Bernerd Limbo, MD ?Pharmacy:   ?[place] ?[address], [place] [address] ?Phone: [phone] Fax: [phone] ? ?[id number] [address], [address] AT [facility] [address] ?[place] ?[place] [address] ?Phone: [phone] Fax: [phone] ? ? ? ? ?Social Determinants of Health (SDOH) Interventions ?  ? ?Readmission Risk Interventions ?No flowsheet data found. ? ? ?

## 2021-11-29 LAB — BASIC METABOLIC PANEL
Anion gap: 12 (ref 5–15)
BUN: 21 mg/dL (ref 8–23)
CO2: 26 mmol/L (ref 22–32)
Calcium: 9 mg/dL (ref 8.9–10.3)
Chloride: 95 mmol/L — ABNORMAL LOW (ref 98–111)
Creatinine, Ser: 1.2 mg/dL — ABNORMAL HIGH (ref 0.44–1.00)
GFR, Estimated: 48 mL/min — ABNORMAL LOW (ref >60)
Glucose, Bld: 117 mg/dL — ABNORMAL HIGH (ref 70–99)
Potassium: 3.8 mmol/L (ref 3.5–5.1)
Sodium: 133 mmol/L — ABNORMAL LOW (ref 135–145)

## 2021-11-29 LAB — CBC
HCT: 35 % — ABNORMAL LOW (ref 36.0–46.0)
Hemoglobin: 11.9 g/dL — ABNORMAL LOW (ref 12.0–15.0)
MCH: 30.6 pg (ref 26.0–34.0)
MCHC: 34 g/dL (ref 30.0–36.0)
MCV: 90 fL (ref 80.0–100.0)
Platelets: 225 10*3/uL (ref 150–400)
RBC: 3.89 MIL/uL (ref 3.87–5.11)
RDW: 14.4 % (ref 11.5–15.5)
WBC: 17.4 10*3/uL — ABNORMAL HIGH (ref 4.0–10.5)
nRBC: 0 % (ref 0.0–0.2)

## 2021-11-29 LAB — GLUCOSE, CAPILLARY
Glucose-Capillary: 126 mg/dL — ABNORMAL HIGH (ref 70–99)
Glucose-Capillary: 181 mg/dL — ABNORMAL HIGH (ref 70–99)
Glucose-Capillary: 176 mg/dL — ABNORMAL HIGH (ref 70–99)
Glucose-Capillary: 198 mg/dL — ABNORMAL HIGH (ref 70–99)

## 2021-11-29 LAB — MAGNESIUM: Magnesium: 1.6 mg/dL — ABNORMAL LOW (ref 1.7–2.4)

## 2021-11-29 MED ORDER — CEFDINIR 300 MG PO CAPS
300.0000 mg | ORAL_CAPSULE | Freq: Two times a day (BID) | ORAL | Status: DC
  Administered 2021-11-29 – 2021-11-30 (×2): 300 mg via ORAL
  Filled 2021-11-29 (×4): qty 1

## 2021-11-29 MED ORDER — MAGNESIUM SULFATE 4 GM/100ML IV SOLN
4.0000 g | Freq: Once | INTRAVENOUS | Status: AC
  Administered 2021-11-29: 4 g via INTRAVENOUS
  Filled 2021-11-29: qty 100

## 2021-11-29 MED ORDER — AMLODIPINE BESYLATE 5 MG PO TABS
5.0000 mg | ORAL_TABLET | Freq: Every day | ORAL | Status: DC
  Administered 2021-11-29 – 2021-11-30 (×2): 5 mg via ORAL
  Filled 2021-11-29 (×2): qty 1

## 2021-11-29 NOTE — TOC Progression Note (Signed)
Transition of Care (TOC) - Progression Note  ? ? ?Patient Details  ?Name: Kristine Gray ?MRN: [id number] ?Date of Birth: 04-02-1951 ? ?Transition of Care (TOC) CM/SW Contact  ?Tom-Johnson, Renea Ee, RN ?Phone Number: ?11/29/2021, 3:54 PM ? ?Clinical Narrative:    ? ?CM notified by MD that patient would like to go home and do outpatient PT/OT instead of going to SNF. CM notified PT/OT to reassess and they recommended Outpatient PT/OT. CM spoke with patient and gave her list of facilities and she chose [facility] on [address]. Order and information on AVS. Bedside commode and rolling walker ordered from [employer] and Freda Munro to deliver at bedside. CM will continue to follow with needs. ? ?Expected Discharge Plan: [place] ?Barriers to Discharge: Ship broker, Continued Medical Work up, SNF Pending bed offer ? ?Expected Discharge Plan and Services ?Expected Discharge Plan: [place] ?  ?  ?Post Acute Care Choice: [place] ?Living arrangements for the past 2 months: [place] ?                ?  ?  ?  ?  ?  ?  ?  ?  ?  ?  ? ? ?Social Determinants of Health (SDOH) Interventions ?  ? ?Readmission Risk Interventions ?No flowsheet data found. ? ?

## 2021-11-29 NOTE — Progress Notes (Signed)
Inpatient Diabetes Program Recommendations ? ?AACE/ADA: New Consensus Statement on Inpatient Glycemic Control (2015) ? ?Target Ranges:  Prepandial:   less than 140 mg/dL ?     Peak postprandial:   less than 180 mg/dL (1-2 hours) ?     Critically ill patients:  140 - 180 mg/dL  ? ?Lab Results  ?Component Value Date  ? GLUCAP 181 (H) 11/29/2021  ? HGBA1C 8.1 (H) 11/26/2021  ? ? ?Review of Glycemic Control ? Latest Reference Range & Units 11/28/21 16:41 11/28/21 21:27 11/29/21 07:44 11/29/21 11:35  ?Glucose-Capillary 70 - 99 mg/dL 250 (H) 292 (H) 126 (H) 181 (H)  ?(H): Data is abnormally high ?Diabetes history: Type 2 DM ?Outpatient Diabetes medications: U-500 135 units B/ 90 units L/ 50 units with dinner ?Current orders for Inpatient glycemic control: U-500 50 units TID, Novolog 0-9 units TID ?Prednisone 40 mg QA ? ?Inpatient Diabetes Program Recommendations:   ? ?Consider increasing U-500 to 55 units with breakfast and lunch.  ? ?Thanks, ?Bronson Curb, MSN, RNC-OB ?Diabetes Coordinator ?[phone] (8a-5p) ? ? ? ? ? ?

## 2021-11-29 NOTE — Progress Notes (Signed)
Physical Therapy Treatment ?Patient Details ?Name: Kristine Gray ?MRN: [id number] ?DOB: 11-25-1950 ?Today's Date: 11/29/2021 ? ? ?History of Present Illness 71 yo female with onset of dizziness and near syncope was noted to have previously had a  fall at home, returned home yesterday from ED.  Now brought to hosp on 3/4, after recent fall was cleared for head and neck fractures but UTI suspected.  Has nephrostomy site, L flank.  Current concern after striking head of concussion, with mult episodes of N&V and husband having recorded low BP at home.  PMHx:  Falls, TIA, DM, HTN, CKD3, low BP ? ?  ?PT Comments  ? ? Continuing work on functional mobility and activity tolerance;  Session focused on progressive ambulation, and pt tolerated well; While gout pain was present, it was far less than previous sessions, and pt was able to walk household distances in hallway with ease; we discussed the stairs to access her bedroom, and pt will opt to stay on the first floor initially upon return home; Happy to update DC recommendations to going home with Outpt PT follow up   ?Recommendations for follow up therapy are one component of a multi-disciplinary discharge planning process, led by the attending physician.  Recommendations may be updated based on patient status, additional functional criteria and insurance authorization. ? ?Follow Up Recommendations ? Outpatient PT ?  ?  ?Assistance Recommended at Discharge Intermittent Supervision/Assistance  ?Patient can return home with the following A little help with bathing/dressing/bathroom;Help with stairs or ramp for entrance ?  ?Equipment Recommendations ? Rolling walker (2 wheels);Other (comment) (Shower seat)  ?  ?Recommendations for Other Services   ? ? ?  ?Precautions / Restrictions Precautions ?Precautions: Fall ?Precaution Comments: monitor for concussion symptoms  ?  ? ?Mobility ? Bed Mobility ?  ?  ?  ?  ?  ?  ?  ?  ?  ? ?Transfers ?Overall transfer level: Needs  assistance ?Equipment used: Rolling walker (2 wheels) ?Transfers: Sit to/from Stand ?Sit to Stand: Min guard (without physical contact) ?  ?  ?  ?  ?  ?General transfer comment: Slow rise and good push up from armrests of BSC; no physical assist needed ?  ? ?Ambulation/Gait ?Ambulation/Gait assistance: Supervision ?Gait Distance (Feet): 150 Feet ?Assistive device: Rolling walker (2 wheels) ?Gait Pattern/deviations: Decreased step length - left, Decreased stance time - right, Antalgic ?Gait velocity: slowed ?  ?  ?General Gait Details: Smoother steps; R foot stil painful, but improving ? ? ?Stairs ?  ?  ?  ?  ?General stair comments: We discussed the stairs she has in her home, and the option to use a shower seat with each side of legs at differing heights to give her an option to sit down and rest if she needs to ? ? ?Wheelchair Mobility ?  ? ?Modified Rankin (Stroke Patients Only) ?  ? ? ?  ?Balance   ?  ?Sitting balance-Leahy Scale: Fair ?  ?  ?  ?Standing balance-Leahy Scale: Poor (approaching Fair) ?  ?  ?  ?  ?  ?  ?  ?  ?  ?  ?  ?  ?  ? ?  ?Cognition Arousal/Alertness: Awake/alert ?Behavior During Therapy: [facility] for tasks assessed/performed ?Overall Cognitive Status: Within Functional Limits for tasks assessed ?  ?  ?  ?  ?  ?  ?  ?  ?  ?  ?  ?  ?  ?  ?  ?  ?  ?  ?  ? ?  ?  Exercises   ? ?  ?General Comments General comments (skin integrity, edema, etc.): Gout pain is improving; pt opted to end session back on the [facility] to further attempt to move bowels ?  ?  ? ?Pertinent Vitals/Pain Pain Assessment ?Pain Assessment: 0-10 ?Pain Score: 3  ?Faces Pain Scale: Hurts a little bit ?Pain Location: R foot, lateral aspect, with weight bearing ?Pain Descriptors / Indicators: Grimacing ?Pain Intervention(s): Monitored during session  ? ? ?Home Living   ?  ?  ?  ?  ?  ?  ?  ?  ?  ?   ?  ?Prior Function    ?  ?  ?   ? ?PT Goals (current goals can now be found in the care plan section) Acute Rehab PT Goals ?Patient Stated Goal:  to go home with husband ASAP ?PT Goal Formulation: With patient ?Time For Goal Achievement: 12/13/21 ?Potential to Achieve Goals: Good ?Progress towards PT goals: Goals met and updated - see care plan ? ?  ?Frequency ? ? ? Min 3X/week ? ? ? ?  ?PT Plan Discharge plan needs to be updated  ? ? ?Co-evaluation   ?  ?  ?  ?  ? ?  ?AM-PAC PT "6 Clicks" Mobility   ?Outcome Measure ? Help needed turning from your back to your side while in a flat bed without using bedrails?: None ?Help needed moving from lying on your back to sitting on the side of a flat bed without using bedrails?: A Little ?Help needed moving to and from a bed to a chair (including a wheelchair)?: A Little ?Help needed standing up from a chair using your arms (e.g., wheelchair or bedside chair)?: None ?Help needed to walk in hospital room?: A Little ?Help needed climbing 3-5 steps with a railing? : A Little ?6 Click Score: 20 ? ?  ?End of Session   ?Activity Tolerance: Patient tolerated treatment well ?Patient left: with call bell/phone within reach (on [facility]) ?Nurse Communication: Mobility status ?PT Visit Diagnosis: Muscle weakness (generalized) (M62.81);Pain;History of falling (Z91.81);Difficulty in walking, not elsewhere classified (R26.2) ?Pain - Right/Left: Right ?Pain - part of body: Knee;Leg;Ankle and joints of foot ?  ? ? ?Time: 9012-2241 ?PT Time Calculation (min) (ACUTE ONLY): 24 min ? ?Charges:  $Gait Training: 23-37 mins          ?          ? ?Roney Marion, PT  ?Acute Rehabilitation Services ?Pager [phone] ?Office [phone] ? ? ? ?[name] ?11/29/2021, 1:27 PM ? ?

## 2021-11-29 NOTE — Progress Notes (Signed)
PROGRESS NOTE    Kristine Gray  WNU:[id number] DOB: 10-Mar-1951 DOA: 11/26/2021 PCP: Bernerd Limbo, MD    Chief Complaint  Patient presents with   Fall    Brief Narrative:  Patient 71 year old female prior history of TIA, type 2 diabetes, hypertension, CKD stage III brought to the ED as she had difficulty getting up from the commode.  Patient's husband checked her blood pressure noted to be hypotensive with systolic in the 03K.  Patient noted the day prior to admission had a fall hit her face and sustained bruises on the scalp and around her eyes.  Patient also noted to have some episodes of nausea and vomiting.  Patient brought by EMS to the ED noted to have systolic blood pressures in the 100s improved with IV fluids.  Urinalysis concerning for UTI.  CT head and CT maxillofacial showing large scalp hematoma, periorbital hematoma.  CT head unremarkable.  CT C-spine negative.  Plain films negative for any fracture.  Patient placed on IV antibiotics, IV fluids and admitted for further evaluation and management.   Assessment & Plan:   Principal Problem:   Near syncope Active Problems:   DM2 (diabetes mellitus, type 2) (HCC)   HTN (hypertension)   Hyperlipidemia   TIA (transient ischemic attack)   UTI (urinary tract infection)   Hypomagnesemia   Gout attack  #1 near syncopal episode -Likely multifactorial secondary to dehydration from emesis, poor oral intake after full in the setting of probable UTI and diuretics. -Blood pressure responded to IV fluids. -CT head negative for any acute intracranial abnormalities. -Patient pancultured with urine cultures and blood cultures pending.  -COVID-19 PCR negative. -Blood cultures pending.  Urine cultures negative.   -Saline lock IV fluids. -Transition from IV Rocephin to oral Omnicef to complete a 5-day course of antibiotic treatment.   -PT/OT to reassess.   2.  UTI -Urine cultures with no growth to date.   -Patient afebrile,  improving clinically.   -Transition from IV Rocephin to oral Omnicef to complete 5-day course of empiric antibiotic treatment.   3.  Dehydration -Patient hydrated with IV fluids. -Saline lock IV fluids.  4.  Hypertension -Continue to hold HCTZ. -Blood pressure improving.   -Continue home regimen Bystolic.   -Hydralazine as needed.   -We will likely not resume HCTZ on discharge. -If further blood pressure control is needed may consider starting Norvasc.  5.  Diabetes mellitus type 2 -Hemoglobin A1c 8.1 (11/26/2021) -Patient noted on insulin U-500. -Per RN patient noted to have some bouts of hypoglycemia at home and as such U-500 has been decreased to 50 units 3 times daily.  -CBG 126 this morning. -Diabetic coordinator consulted.  6.  Nausea/vomiting -Likely secondary to concussion. -Abdomen benign. -Clinical improvement -Continue IV antiemetics, supportive care.   -Saline lock IV fluids.    7.  History of TIA -Patient noted to be on Plavix however currently on hold due to large scalp and periorbital hematoma. -Resume Plavix today.   -PT/OT to reassess.   8.  CKD stage IIIb -Currently at baseline. -Saline lock IV fluids.   9. mild hyponatremia -Likely secondary to hypovolemic hyponatremia in the setting of HCTZ. -HCTZ on hold.   -Hyponatremia improved with hydration. -Saline lock IV fluids. -Repeat labs in the AM. -Likely will not resume HCTZ on discharge.  10.  Scalp hematoma status post fall -Continue warm/cold compresses. -Resume Plavix today and monitor for the next 24 hours.  11.  Acute gouty attack -Patient with complaints of  gout attack in the right ankle which is exquisitely tender to palpation with some warmth but no erythema on 11/27/2021. -Improved clinically after IV Solu-Medrol 60 mg x 1 on 11/27/2021. -Continue oral prednisone 40 mg daily for the next 4 to 5 days. -Outpatient follow-up with PCP.  12.  Severe hypomagnesemia -Magnesium at 0.9 >>> 1.6>>>  1.6. -Magnesium sulfate 4 g IV x1.   -Continue oral magnesium supplementation. -Repeat labs in the AM.  13.  Leukocytosis -Likely multifactorial secondary to UTI, current steroid treatment.  Patient afebrile. -Urine cultures negative. -Blood cultures with no growth to date. -Transition from IV Rocephin to oral Omnicef to complete a 5-day course of antibiotic treatment.    DVT prophylaxis: SCDs Code Status: Full Family Communication: Updated patient.  No family at bedside. Disposition: SNF versus outpatient PT.   Status is: Inpatient          Consultants:  None  Procedures:  CT head 11/26/2021 CT maxillofacial 11/26/2021 CT C-spine 11/26/2021 Chest x-ray 11/26/2021 Plain films of the right ankle 11/26/2021 Plan.  The left forearm 11/26/2021 Plain films of the right knee 11/26/2021 Plain films of the pelvis 11/26/2021 Plain films of the right shoulder 11/26/2021  Antimicrobials:  IV Rocephin 11/26/2021>>>> 11/29/2021 Omnicef 11/29/2021>>>>   Subjective: Sleeping but arousable.  Stated standing chair early on today.  Right ankle pain improving.  Dizziness and lightheadedness improvement.  No shortness of breath.  No chest pain.  Overall feeling better.  Interested in outpatient physical therapy instead of SNF.  Objective: Vitals:   11/28/21 1640 11/28/21 2056 11/29/21 0447 11/29/21 0934  BP: (!) 151/73 (!) 155/70 (!) 148/62 (!) 150/65  Pulse: 67 78 71 70  Resp: '16 18 20 17  '$ Temp: 97.7 F (36.5 C) 98.2 F (36.8 C) 98.2 F (36.8 C) 98.3 F (36.8 C)  TempSrc: Oral Oral Oral   SpO2: 99% 100% 100% 97%  Weight:      Height:        Intake/Output Summary (Last 24 hours) at 11/29/2021 1541 Last data filed at 11/29/2021 0900 Gross per 24 hour  Intake 3697.13 ml  Output --  Net 3697.13 ml    Filed Weights   11/26/21 0043 11/26/21 1610 11/26/21 2006  Weight: 122.9 kg 124.1 kg 124.6 kg    Examination:  General exam: NAD.  Hematoma noted left head.  Ecchymosis around  eyes. Respiratory system: CTA B.  No wheezes, no crackles, no rhonchi.  Normal respiratory effort.  Speaking in full sentences.  Cardiovascular system: Regular rate rhythm no murmurs rubs or gallops.  No JVD.  No lower extremity edema.  Gastrointestinal system: Abdomen is obese, soft, nontender, nondistended, positive bowel sounds.  No rebound.  No guarding.  Central nervous system: Alert and oriented. No focal neurological deficits. Extremities: Right ankle decreased warmth, less tender to palpation.  Skin: No rashes, lesions or ulcers Psychiatry: Judgement and insight appear normal. Mood & affect appropriate.     Data Reviewed: I have personally reviewed following labs and imaging studies  CBC: Recent Labs  Lab 11/26/21 0057 11/26/21 0356 11/27/21 0239 11/28/21 0220 11/29/21 0438  WBC 12.8* 13.0* 15.7* 18.9* 17.4*  NEUTROABS 8.5*  --  11.3* 15.0*  --   HGB 13.9 13.6 13.2 13.2 11.9*  HCT 40.1 40.7 37.9 36.3 35.0*  MCV 91.3 91.3 89.2 87.7 90.0  PLT 235 225 235 205 225     Basic Metabolic Panel: Recent Labs  Lab 11/26/21 0057 11/26/21 0356 11/27/21 0239 11/28/21 0220 11/29/21 3151  NA 130* 131* 132* 132* 133*  K 4.2 3.7 3.8 3.5 3.8  CL 90* 91* 93* 94* 95*  CO2 '26 24 28 28 26  '$ GLUCOSE 258* 233* 164* 162* 117*  BUN '17 16 15 17 21  '$ CREATININE 1.88* 1.71* 1.43* 1.26* 1.20*  CALCIUM 9.0 9.0 8.9 9.2 9.0  MG  --   --  0.9* 1.6* 1.6*     GFR: Estimated Creatinine Clearance: 56.1 mL/min (A) (by C-G formula based on SCr of 1.2 mg/dL (H)).  Liver Function Tests: Recent Labs  Lab 11/26/21 0057  AST 25  ALT 21  ALKPHOS 70  BILITOT 1.4*  PROT 6.6  ALBUMIN 3.6     CBG: Recent Labs  Lab 11/28/21 1112 11/28/21 1641 11/28/21 2127 11/29/21 0744 11/29/21 1135  GLUCAP 141* 250* 292* 126* 181*      Recent Results (from the past 240 hour(s))  Resp Panel by RT-PCR (Flu A&B, Covid) Nasopharyngeal Swab     Status: None   Collection Time: 11/26/21  5:52 AM    Specimen: Nasopharyngeal Swab; Nasopharyngeal(NP) swabs in vial transport medium  Result Value Ref Range Status   SARS Coronavirus 2 by RT PCR NEGATIVE NEGATIVE Final    Comment: (NOTE) SARS-CoV-2 target nucleic acids are NOT DETECTED.  The SARS-CoV-2 RNA is generally detectable in upper respiratory specimens during the acute phase of infection. The lowest concentration of SARS-CoV-2 viral copies this assay can detect is 138 copies/mL. A negative result does not preclude SARS-Cov-2 infection and should not be used as the sole basis for treatment or other patient management decisions. A negative result may occur with  improper specimen collection/handling, submission of specimen other than nasopharyngeal swab, presence of viral mutation(s) within the areas targeted by this assay, and inadequate number of viral copies(<138 copies/mL). A negative result must be combined with clinical observations, patient history, and epidemiological information. The expected result is Negative.  Fact Sheet for Patients:  EntrepreneurPulse.com.au  Fact Sheet for Healthcare Providers:  IncredibleEmployment.be  This test is no t yet approved or cleared by the [place] FDA and  has been authorized for detection and/or diagnosis of SARS-CoV-2 by FDA under an Emergency Use Authorization (EUA). This EUA will remain  in effect (meaning this test can be used) for the duration of the COVID-19 declaration under Section 564(b)(1) of the Act, 21 U.S.C.section 360bbb-3(b)(1), unless the authorization is terminated  or revoked sooner.       Influenza A by PCR NEGATIVE NEGATIVE Final   Influenza B by PCR NEGATIVE NEGATIVE Final    Comment: (NOTE) The Xpert Xpress SARS-CoV-2/FLU/RSV plus assay is intended as an aid in the diagnosis of influenza from Nasopharyngeal swab specimens and should not be used as a sole basis for treatment. Nasal washings and aspirates are  unacceptable for Xpert Xpress SARS-CoV-2/FLU/RSV testing.  Fact Sheet for Patients: EntrepreneurPulse.com.au  Fact Sheet for Healthcare Providers: IncredibleEmployment.be  This test is not yet approved or cleared by the [place] FDA and has been authorized for detection and/or diagnosis of SARS-CoV-2 by FDA under an Emergency Use Authorization (EUA). This EUA will remain in effect (meaning this test can be used) for the duration of the COVID-19 declaration under Section 564(b)(1) of the Act, 21 U.S.C. section 360bbb-3(b)(1), unless the authorization is terminated or revoked.  Performed at [facility], [place] [address] [place], [place] [address]   Urine Culture     Status: None   Collection Time: 11/26/21 10:17 AM   Specimen: Urine,  Catheterized  Result Value Ref Range Status   Specimen Description URINE, CATHETERIZED  Final   Special Requests NONE  Final   Culture   Final    NO GROWTH Performed at [facility], [address], [place], [place] [address]    Report Status 11/27/2021 FINAL  Final  Culture, blood (Routine X 2) w Reflex to ID Panel     Status: None (Preliminary result)   Collection Time: 11/26/21 10:54 AM   Specimen: BLOOD RIGHT HAND  Result Value Ref Range Status   Specimen Description BLOOD RIGHT HAND  Final   Special Requests   Final    BOTTLES DRAWN AEROBIC AND ANAEROBIC Blood Culture results may not be optimal due to an inadequate volume of blood received in culture bottles   Culture   Final    NO GROWTH 3 DAYS Performed at [facility], [place] [address], [place], [place] [address]    Report Status PENDING  Incomplete  Culture, blood (Routine X 2) w Reflex to ID Panel     Status: None (Preliminary result)   Collection Time: 11/26/21  4:14 PM   Specimen: BLOOD  Result Value Ref Range Status   Specimen Description BLOOD SITE NOT SPECIFIED  Final   Special Requests   Final    BOTTLES DRAWN AEROBIC  ONLY Blood Culture results may not be optimal due to an inadequate volume of blood received in culture bottles   Culture   Final    NO GROWTH 3 DAYS Performed at [facility], [place] [address] [place], [place] [address]    Report Status PENDING  Incomplete          Radiology Studies: No results found.      Scheduled Meds:  allopurinol  100 mg Oral Daily   atorvastatin  80 mg Oral QPM   cefdinir  300 mg Oral Q12H   clopidogrel  75 mg Oral Daily   cyclobenzaprine  10 mg Oral Daily   insulin aspart  0-9 Units Subcutaneous TID WC   insulin regular human CONCENTRATED  50 Units Subcutaneous TID with meals   magnesium oxide  400 mg Oral BID   nebivolol  10 mg Oral BID   predniSONE  40 mg Oral QAC breakfast   vitamin B-12  5,000 mcg Oral Daily   Continuous Infusions:     LOS: 2 days    Time spent: 35 minutes.    Irine Seal, MD [facility]   To contact the attending provider between 7A-7P or the covering provider during after hours 7P-7A, please log into the web site [email or web address] and access using universal [place] password for that web site. If you do not have the password, please call the hospital operator.  11/29/2021, 3:41 PM

## 2021-11-29 NOTE — Progress Notes (Signed)
Occupational Therapy Treatment ?Patient Details ?Name: Kristine Gray ?MRN: [id number] ?DOB: 03-10-1951 ?Today's Date: 11/29/2021 ? ? ?History of present illness 71 yo female with onset of dizziness and near syncope was noted to have previously had a  fall at home, returned home yesterday from ED.  Now brought to hosp on 3/4, after recent fall was cleared for head and neck fractures but UTI suspected.  Has nephrostomy site, L flank.  Current concern after striking head of concussion, with mult episodes of N&V and husband having recorded low BP at home.  PMHx:  Falls, TIA, DM, HTN, CKD3, low BP ?  ?OT comments ? [name] is making excellent progress with her pain better controlled. Overall she was able to complete Adls and functional mobility with min guard A and cues for safety/compensatory techniques. Pt verbalized great understanding of RW use in the home, sponge bathing at the sink and to stay on the 1st level of their home initially for safety. She continues to be limited by poor insight to deficits and general weakness, however can d/c home with 24/7 direct assist from her husband. OT to continue to follow acutely.   ? ?Recommendations for follow up therapy are one component of a multi-disciplinary discharge planning process, led by the attending physician.  Recommendations may be updated based on patient status, additional functional criteria and insurance authorization. ?   ?Follow Up Recommendations ? Outpatient OT  ?  ?Assistance Recommended at Discharge Frequent or constant Supervision/Assistance  ?Patient can return home with the following ? A little help with walking and/or transfers;A little help with bathing/dressing/bathroom;Assistance with cooking/housework;Assist for transportation;Help with stairs or ramp for entrance ?  ?Equipment Recommendations ? BSC/3in1  ?  ?   ?Precautions / Restrictions Precautions ?Precautions: Fall ?Precaution Comments: monitor for concussion symptoms ?Restrictions ?Weight  Bearing Restrictions: No  ? ? ?  ? ?Mobility Bed Mobility ?  ?  ?  ?  ?  ?  ?  ?General bed mobility comments: in chair upon arrival ?  ? ?Transfers ?Overall transfer level: Needs assistance ?Equipment used: Rolling walker (2 wheels) ?Transfers: Sit to/from Stand ?Sit to Stand: Min guard ?  ?  ?  ?  ?  ?  ?  ?  ?Balance Overall balance assessment: Needs assistance, History of Falls ?Sitting-balance support: Feet supported, Bilateral upper extremity supported ?Sitting balance-Leahy Scale: Fair ?  ?  ?Standing balance support: Single extremity supported, During functional activity ?Standing balance-Leahy Scale: Fair ?  ?  ?  ?  ?  ?  ?  ?  ?  ?  ?  ?  ?   ? ?ADL either performed or assessed with clinical judgement  ? ?ADL Overall ADL's : Needs assistance/impaired ?  ?  ?Grooming: Min guard;Standing ?  ?  ?  ?  ?  ?  ?  ?  ?  ?Toilet Transfer: Min guard;Ambulation;BSC/3in1;Rolling walker (2 wheels) ?Toilet Transfer Details (indicate cue type and reason): cues for hand placement, BSC over toilet. incr time. deliberate for pain management ?  ?  ?  ?Tub/Shower Transfer Details (indicate cue type and reason): discussed option to sponge bathe at the sink for safety ?  ?  ?  ? ?Extremity/Trunk Assessment Upper Extremity Assessment ?Upper Extremity Assessment: Generalized weakness ?  ?Lower Extremity Assessment ?Lower Extremity Assessment: Defer to PT evaluation ?  ?  ?  ? ?Vision   ?Vision Assessment?: No apparent visual deficits ?  ?Perception Perception ?Perception: Within Functional Limits ?  ?Praxis  Praxis ?Praxis: Intact ?  ? ?Cognition Arousal/Alertness: Awake/alert ?Behavior During Therapy: [facility] for tasks assessed/performed ?Overall Cognitive Status: Within Functional Limits for tasks assessed ?  ?  ?  ?  ?  ?  ?  ?  ?  ?  ?  ?  ?  ?  ?  ?  ?General Comments: discusses home set up and good problem solving for safety and energy conservation ?  ?  ?   ?   ?   ?General Comments pain well managed this session   ? ? ?Pertinent Vitals/ Pain       Pain Assessment ?Pain Assessment: Faces ?Faces Pain Scale: Hurts a little bit ?Pain Location: R foot, lateral aspect, with weight bearing ?Pain Descriptors / Indicators: Grimacing ?Pain Intervention(s): Limited activity within patient's tolerance ? ? ?Frequency ? Min 2X/week  ? ? ? ? ?  ?Progress Toward Goals ? ?OT Goals(current goals can now be found in the care plan section) ? Progress towards OT goals: Progressing toward goals ? ?Acute Rehab OT Goals ?Patient Stated Goal: home ?OT Goal Formulation: With patient ?Time For Goal Achievement: 12/11/21 ?Potential to Achieve Goals: Good ?ADL Goals ?Pt Will Perform Lower Body Bathing: with mod assist;sitting/lateral leans;sit to/from stand ?Pt Will Perform Lower Body Dressing: with mod assist;sitting/lateral leans;sit to/from stand ?Pt Will Transfer to Toilet: with mod assist;stand pivot transfer ?Pt Will Perform Toileting - Clothing Manipulation and hygiene: with mod assist;sitting/lateral leans;sit to/from stand ?Additional ADL Goal #1: Pt will complete bed mobility with mod I as precursor to seated ADL's.  ?Plan Discharge plan needs to be updated   ? ?   ?AM-PAC OT "6 Clicks" Daily Activity     ?Outcome Measure ? ? Help from another person eating meals?: None ?Help from another person taking care of personal grooming?: A Little ?Help from another person toileting, which includes using toliet, bedpan, or urinal?: A Little ?Help from another person bathing (including washing, rinsing, drying)?: A Little ?Help from another person to put on and taking off regular upper body clothing?: None ?Help from another person to put on and taking off regular lower body clothing?: A Little ?6 Click Score: 20 ? ?  ?End of Session Equipment Utilized During Treatment: Gait belt;Rolling walker (2 wheels) ? ?OT Visit Diagnosis: Unsteadiness on feet (R26.81);Other abnormalities of gait and mobility (R26.89);Muscle weakness (generalized) (M62.81);Pain ?   ?Activity Tolerance Patient tolerated treatment well ?  ?Patient Left with call bell/phone within reach;in chair;with chair alarm set ?  ?Nurse Communication Mobility status ?  ? ?   ? ?Time: 4097-3532 ?OT Time Calculation (min): 20 min ? ?Charges: OT General Charges ?$OT Visit: 1 Visit ?OT Treatments ?$Self Care/Home Management : 8-22 mins ? ? ?Mykhia Danish A Ilze Roselli ?11/29/2021, 3:33 PM ?

## 2021-11-30 ENCOUNTER — Other Ambulatory Visit (HOSPITAL_BASED_OUTPATIENT_CLINIC_OR_DEPARTMENT_OTHER): Payer: Self-pay

## 2021-11-30 LAB — BASIC METABOLIC PANEL
Anion gap: 12 (ref 5–15)
BUN: 21 mg/dL (ref 8–23)
CO2: 25 mmol/L (ref 22–32)
Calcium: 9.3 mg/dL (ref 8.9–10.3)
Chloride: 94 mmol/L — ABNORMAL LOW (ref 98–111)
Creatinine, Ser: 1.13 mg/dL — ABNORMAL HIGH (ref 0.44–1.00)
GFR, Estimated: 52 mL/min — ABNORMAL LOW (ref >60)
Glucose, Bld: 225 mg/dL — ABNORMAL HIGH (ref 70–99)
Potassium: 4.3 mmol/L (ref 3.5–5.1)
Sodium: 131 mmol/L — ABNORMAL LOW (ref 135–145)

## 2021-11-30 LAB — CBC
HCT: 35.1 % — ABNORMAL LOW (ref 36.0–46.0)
Hemoglobin: 12.3 g/dL (ref 12.0–15.0)
MCH: 31.4 pg (ref 26.0–34.0)
MCHC: 35 g/dL (ref 30.0–36.0)
MCV: 89.5 fL (ref 80.0–100.0)
Platelets: 215 10*3/uL (ref 150–400)
RBC: 3.92 MIL/uL (ref 3.87–5.11)
RDW: 14.3 % (ref 11.5–15.5)
WBC: 12.9 10*3/uL — ABNORMAL HIGH (ref 4.0–10.5)
nRBC: 0 % (ref 0.0–0.2)

## 2021-11-30 LAB — MAGNESIUM: Magnesium: 1.7 mg/dL (ref 1.7–2.4)

## 2021-11-30 LAB — GLUCOSE, CAPILLARY
Glucose-Capillary: 225 mg/dL — ABNORMAL HIGH (ref 70–99)
Glucose-Capillary: 160 mg/dL — ABNORMAL HIGH (ref 70–99)

## 2021-11-30 MED ORDER — CEFDINIR 300 MG PO CAPS
300.0000 mg | ORAL_CAPSULE | Freq: Two times a day (BID) | ORAL | 0 refills | Status: DC
  Filled 2021-11-30: qty 2, 1d supply, fill #0

## 2021-11-30 MED ORDER — AMLODIPINE BESYLATE 5 MG PO TABS
5.0000 mg | ORAL_TABLET | Freq: Every day | ORAL | 0 refills | Status: DC
  Filled 2021-11-30: qty 30, 30d supply, fill #0

## 2021-11-30 MED ORDER — PREDNISONE 20 MG PO TABS
20.0000 mg | ORAL_TABLET | Freq: Every day | ORAL | 0 refills | Status: AC
Start: 2021-12-01 — End: ?
  Filled 2021-11-30: qty 3, 3d supply, fill #0

## 2021-11-30 MED ORDER — MAGNESIUM OXIDE -MG SUPPLEMENT 400 (240 MG) MG PO TABS
400.0000 mg | ORAL_TABLET | Freq: Two times a day (BID) | ORAL | 0 refills | Status: AC
Start: 2021-11-30 — End: ?
  Filled 2021-11-30: qty 120, 60d supply, fill #0

## 2021-11-30 NOTE — Plan of Care (Signed)
?  Problem: Education: ?Goal: Knowledge of General Education information will improve ?Description: Including pain rating scale, medication(s)/side effects and non-pharmacologic comfort measures ?11/30/2021 1524 by Ardelia Mems, RN ?Outcome: Adequate for Discharge ?11/30/2021 1157 by Ardelia Mems, RN ?Outcome: Progressing ?  ?Problem: Health Behavior/Discharge Planning: ?Goal: Ability to manage health-related needs will improve ?11/30/2021 1524 by Ardelia Mems, RN ?Outcome: Adequate for Discharge ?11/30/2021 1157 by Ardelia Mems, RN ?Outcome: Progressing ?  ?Problem: Clinical Measurements: ?Goal: Ability to maintain clinical measurements within normal limits will improve ?11/30/2021 1524 by Ardelia Mems, RN ?Outcome: Adequate for Discharge ?11/30/2021 1157 by Ardelia Mems, RN ?Outcome: Progressing ?Goal: Will remain free from infection ?11/30/2021 1524 by Ardelia Mems, RN ?Outcome: Adequate for Discharge ?11/30/2021 1157 by Ardelia Mems, RN ?Outcome: Progressing ?Goal: Diagnostic test results will improve ?11/30/2021 1524 by Ardelia Mems, RN ?Outcome: Adequate for Discharge ?11/30/2021 1157 by Ardelia Mems, RN ?Outcome: Progressing ?Goal: Respiratory complications will improve ?11/30/2021 1524 by Ardelia Mems, RN ?Outcome: Adequate for Discharge ?11/30/2021 1157 by Ardelia Mems, RN ?Outcome: Progressing ?Goal: Cardiovascular complication will be avoided ?11/30/2021 1524 by Ardelia Mems, RN ?Outcome: Adequate for Discharge ?11/30/2021 1157 by Ardelia Mems, RN ?Outcome: Progressing ?  ?Problem: Activity: ?Goal: Risk for activity intolerance will decrease ?11/30/2021 1524 by Ardelia Mems, RN ?Outcome: Adequate for Discharge ?11/30/2021 1157 by Ardelia Mems, RN ?Outcome: Progressing ?  ?Problem: Nutrition: ?Goal: Adequate nutrition will be maintained ?11/30/2021 1524 by Ardelia Mems, RN ?Outcome: Adequate for Discharge ?11/30/2021 1157 by  Ardelia Mems, RN ?Outcome: Progressing ?  ?Problem: Elimination: ?Goal: Will not experience complications related to bowel motility ?11/30/2021 1524 by Ardelia Mems, RN ?Outcome: Adequate for Discharge ?11/30/2021 1157 by Ardelia Mems, RN ?Outcome: Progressing ?Goal: Will not experience complications related to urinary retention ?11/30/2021 1524 by Ardelia Mems, RN ?Outcome: Adequate for Discharge ?11/30/2021 1157 by Ardelia Mems, RN ?Outcome: Progressing ?  ?Problem: Pain Managment: ?Goal: General experience of comfort will improve ?11/30/2021 1524 by Ardelia Mems, RN ?Outcome: Adequate for Discharge ?11/30/2021 1157 by Ardelia Mems, RN ?Outcome: Progressing ?  ?Problem: Safety: ?Goal: Ability to remain free from injury will improve ?11/30/2021 1524 by Ardelia Mems, RN ?Outcome: Adequate for Discharge ?11/30/2021 1157 by Ardelia Mems, RN ?Outcome: Progressing ?  ?Problem: Skin Integrity: ?Goal: Risk for impaired skin integrity will decrease ?11/30/2021 1524 by Ardelia Mems, RN ?Outcome: Adequate for Discharge ?11/30/2021 1157 by Ardelia Mems, RN ?Outcome: Progressing ?  ?

## 2021-11-30 NOTE — Care Management Important Message (Signed)
Important Message ? ?Patient Details  ?Name: Kristine Gray ?MRN: [id number] ?Date of Birth: 10-17-1950 ? ? ?Medicare Important Message Given:  Yes ? ? ? ? ?Kristine Gray ?11/30/2021, 1:53 PM ?

## 2021-11-30 NOTE — Progress Notes (Signed)
DME for discharge has not yet been delivered. Pt discharge completed otherwise.  ?

## 2021-11-30 NOTE — TOC Transition Note (Addendum)
Transition of Care (TOC) - CM/SW Discharge Note ? ? ?Patient Details  ?Name: Kristine Gray ?MRN: [id number] ?Date of Birth: Jul 15, 1951 ? ?Transition of Care (TOC) CM/SW Contact:  ?Tom-Johnson, Renea Ee, RN ?Phone Number: ?11/30/2021, 1:34 PM ? ? ?Clinical Narrative:    ? ?Patient is scheduled for discharge today. Outpatient PT/OT with [facility] and information on AVS. Bedside commode, shower seat and rolling walker at bedside. Family to transport at discharge. No further TOC needs noted ? ?Final next level of care: OP Rehab ?Barriers to Discharge: Barriers Resolved ? ? ?Patient Goals and CMS Choice ?Patient states their goals for this hospitalization and ongoing recovery are:: To return home ?CMS Medicare.gov Compare Post Acute Care list provided to:: Patient ?Choice offered to / list presented to : Patient ? ?Discharge Placement ?  ?           ?  ?  ?  ?  ? ?Discharge Plan and Services ?  ?  ?Post Acute Care Choice: [name]          ?DME Arranged: 3-N-1, Walker rolling ?DME Agency: [employer] ?Date DME Agency Contacted: 11/30/21 ?Time DME Agency Contacted: [id number] ?Representative spoke with at DME Agency: Freda Munro ?  ?[place] Agency: NA ?  ?  ?  ? ?Social Determinants of Health (SDOH) Interventions ?  ? ? ?Readmission Risk Interventions ?No flowsheet data found. ? ? ? ? ?

## 2021-11-30 NOTE — Plan of Care (Signed)
?  Problem: Education: Goal: Knowledge of General Education information will improve Description: Including pain rating scale, medication(s)/side effects and non-pharmacologic comfort measures Outcome: Progressing   Problem: Health Behavior/Discharge Planning: Goal: Ability to manage health-related needs will improve Outcome: Progressing   Problem: Clinical Measurements: Goal: Ability to maintain clinical measurements within normal limits will improve Outcome: Progressing Goal: Will remain free from infection Outcome: Progressing Goal: Diagnostic test results will improve Outcome: Progressing Goal: Respiratory complications will improve Outcome: Progressing Goal: Cardiovascular complication will be avoided Outcome: Progressing   Problem: Coping: Goal: Level of anxiety will decrease Outcome: Progressing   Problem: Elimination: Goal: Will not experience complications related to bowel motility Outcome: Progressing Goal: Will not experience complications related to urinary retention Outcome: Progressing   

## 2021-11-30 NOTE — Discharge Summary (Signed)
Physician Discharge Summary  Kristine Gray WIO:[id number] DOB: Oct 10, 1950 DOA: 11/26/2021  PCP: Bernerd Limbo, MD  Admit date: 11/26/2021 Discharge date: 11/30/2021  Admitted From: home Discharge disposition: home with home health   Recommendations for Outpatient Follow-Up:   Home health BMP 1 week   Discharge Diagnosis:   Principal Problem:   Near syncope Active Problems:   DM2 (diabetes mellitus, type 2) ([facility])   HTN (hypertension)   Hyperlipidemia   TIA (transient ischemic attack)   UTI (urinary tract infection)   Hypomagnesemia   Gout attack    Discharge Condition: Improved.  Diet recommendation: Low sodium, heart healthy.  Carbohydrate-modified.  Wound care: None.  Code status: Full.   History of Present Illness:   Kristine Gray is a 71 y.o. female with history of TIA, diabetes mellitus type 2, hypertension, chronic kidney disease stage III was brought to the ER after patient was finding it difficult to get up from the commode.  Patient husband checked her blood pressure was found it to be in systolic 38G.  EMS was called and patient was brought to the ER.  A day prior to this patient did have a fall and hit her face and sustained bruises on her scalp and around her eyes.  At that time patient decided to come to the ER.  Denies any chest pain or shortness of breath.  Since patient's fall and hurting her head has been had at least 4 episodes of vomiting.   ED Course: In the ER patient initial blood pressure was in the 536 systolic which improved with fluid bolus.  At the time of my exam blood pressure systolic in the 468E.  UA is concerning for UTI.  CT scan of the head and maxillofacial done shows large scalp hematoma.  On exam patient also has periorbital hematoma.  X-rays do not show any fracture.  UA shows features concerning for UTI.  EKG shows normal sinus rhythm.  Patient admitted for further work-up and management.  COVID test is  pending.   Hospital Course by Problem:   #1 near syncopal episode -Likely multifactorial secondary to dehydration from emesis, poor oral intake after full in the setting of probable UTI and diuretics. -Blood pressure responded to IV fluids. -CT head negative for any acute intracranial abnormalities. -Patient pancultured with urine cultures and blood cultures NGTD -COVID-19 PCR negative. -Blood cultures pending.  Urine cultures negative.   -Saline lock IV fluids. -Transition from IV Rocephin to oral Omnicef to complete a 5-day course of antibiotic treatment.   -PT/OT to reassess.   2.  UTI -Urine cultures with no growth to date.   -Patient afebrile, improving clinically.   -Transition from IV Rocephin to oral Omnicef to complete 5-day course of empiric antibiotic treatment.   3.  Dehydration -Patient hydrated with IV fluids. -Saline lock IV fluids.  4.  Hypertension -Continue to hold HCTZ. -Blood pressure improving.   -Continue home regimen Bystolic.   -Hydralazine as needed.   -We will likely not resume HCTZ on discharge. -If further blood pressure control is needed may consider starting Norvasc.  5.  Diabetes mellitus type 2 -Hemoglobin A1c 8.1 (11/26/2021) -Patient noted on insulin U-500. -resume home meds  6.  Nausea/vomiting -Likely secondary to concussion. -Abdomen benign. -Clinical improvement -Continue IV antiemetics, supportive care.   -Saline lock IV fluids.    7.  History of TIA -Patient noted to be on Plavix however currently on hold due to large scalp and  periorbital hematoma. -Resume Plavix  -PT/OT to reassess.- home health  8.  CKD stage IIIb -Currently at baseline. -Saline lock IV fluids.   9. mild hyponatremia -Likely secondary to hypovolemic hyponatremia in the setting of HCTZ. -HCTZ on hold.   -Hyponatremia improved with hydration. - will not resume HCTZ on discharge.  10.  Scalp hematoma status post fall -Continue warm/cold  compresses. -Resume Plavix   11.  Acute gouty attack -Patient with complaints of gout attack in the right ankle which is exquisitely tender to palpation with some warmth but no erythema on 11/27/2021. -Improved clinically after IV Solu-Medrol 60 mg x 1 on 11/27/2021. -Continue oral prednisone at a lower dose for the next 4 to 5 days. -Outpatient follow-up with PCP.  12.  Severe hypomagnesemia -Magnesium at 0.9 >>> 1.6>>> 1.6. -Magnesium sulfate 4 g IV x1.   -Continue oral magnesium supplementation. -outpateint follow up  13.  Leukocytosis -Likely multifactorial secondary to UTI, current steroid treatment.  Patient afebrile. -Urine cultures negative. -Blood cultures with no growth to date. -Transition from IV Rocephin to oral Omnicef to complete a 5-day course of antibiotic treatment.      Medical Consultants:      Discharge Exam:   Vitals:   11/30/21 0632 11/30/21 0658  BP: (!) 163/62 (!) 148/60  Pulse:    Resp:    Temp:    SpO2:     Vitals:   11/29/21 2048 11/30/21 0431 11/30/21 0632 11/30/21 0658  BP: (!) 167/76 (!) 163/62 (!) 163/62 (!) 148/60  Pulse: 67 77    Resp: 18 17    Temp: 98 F (36.7 C) 98.3 F (36.8 C)    TempSrc: Oral Oral    SpO2: 97% 97%    Weight:      Height:        General exam: Appears calm and comfortable.   The results of significant diagnostics from this hospitalization (including imaging, microbiology, ancillary and laboratory) are listed below for reference.     Procedures and Diagnostic Studies:   DG Chest 2 View  Result Date: 11/26/2021 CLINICAL DATA:  Fall, rule out fracture. EXAM: CHEST - 2 VIEW COMPARISON:  03/20/2012. FINDINGS: The heart is enlarged and the mediastinal contour is stable. Atherosclerotic calcification of the aorta is noted. Both lungs are clear. No acute osseous abnormality. IMPRESSION: No active cardiopulmonary disease. Electronically Signed   By: Brett Fairy M.D.   On: 11/26/2021 02:02   DG Pelvis 1-2  Views  Result Date: 11/26/2021 CLINICAL DATA:  Fall, rule out fracture. EXAM: PELVIS - 1-2 VIEW COMPARISON:  None. FINDINGS: The cortical margins of the bony pelvis are intact. No fracture. Pubic symphysis and sacroiliac joints are congruent. Bilateral hip osteoarthritis. Both femoral heads are well-seated in the respective acetabula. IMPRESSION: No pelvic fracture. Electronically Signed   By: Keith Rake M.D.   On: 11/26/2021 02:02   DG Shoulder Right  Result Date: 11/26/2021 CLINICAL DATA:  Fall, rule out fracture. EXAM: RIGHT SHOULDER - 2+ VIEW COMPARISON:  None. FINDINGS: There is no evidence of fracture or dislocation. Acromioclavicular degenerative change with spurring. Soft tissues are unremarkable. IMPRESSION: No fracture or subluxation of the right shoulder. Electronically Signed   By: Keith Rake M.D.   On: 11/26/2021 02:02   DG Forearm Left  Result Date: 11/26/2021 CLINICAL DATA:  Fall, rule out fracture. EXAM: LEFT FOREARM - 2 VIEW COMPARISON:  None. FINDINGS: The cortical margins of the radius and ulna are intact. There is no evidence of  fracture or other focal bone lesions. Wrist and elbow alignment are grossly maintained. Soft tissues are unremarkable. IMPRESSION: No fracture of the left forearm. Electronically Signed   By: Keith Rake M.D.   On: 11/26/2021 02:00   DG Ankle Complete Right  Result Date: 11/26/2021 CLINICAL DATA:  Fall, concern for fracture. EXAM: RIGHT ANKLE - COMPLETE 3+ VIEW COMPARISON:  None. FINDINGS: There is no evidence of fracture, dislocation, or joint effusion. Degenerative changes are present in the midfoot and hindfoot. Large calcaneal spur. Enthesopathic changes are noted at the insertion site of the Achilles tendon. Vascular calcifications are present in the soft tissues. Mild soft tissue swelling is present ankle lateral and anteriorly. IMPRESSION: No acute fracture or dislocation. Electronically Signed   By: Brett Fairy M.D.   On: 11/26/2021  02:01   CT Head Wo Contrast  Result Date: 11/26/2021 CLINICAL DATA:  Fall EXAM: CT HEAD WITHOUT CONTRAST CT MAXILLOFACIAL WITHOUT CONTRAST CT CERVICAL SPINE WITHOUT CONTRAST TECHNIQUE: Multidetector CT imaging of the head, cervical spine, and maxillofacial structures were performed using the standard protocol without intravenous contrast. Multiplanar CT image reconstructions of the cervical spine and maxillofacial structures were also generated. RADIATION DOSE REDUCTION: This exam was performed according to the departmental dose-optimization program which includes automated exposure control, adjustment of the mA and/or kV according to patient size and/or use of iterative reconstruction technique. COMPARISON:  None. FINDINGS: CT HEAD FINDINGS Brain: There is no mass, hemorrhage or extra-axial collection. The size and configuration of the ventricles and extra-axial CSF spaces are normal. There is hypoattenuation of the periventricular white matter, most commonly indicating chronic ischemic microangiopathy. Vascular: Atherosclerotic calcification of the internal carotid arteries at the skull base. No abnormal hyperdensity of the major intracranial arteries or dural venous sinuses. Skull: Large left frontal scalp hematoma.  No skull fracture. CT MAXILLOFACIAL FINDINGS Osseous: --Complex facial fracture types: No LeFort, zygomaticomaxillary complex or nasoorbitoethmoidal fracture. --Simple fracture types: None. --Mandible: No fracture or dislocation. Orbits: The globes are intact. Normal appearance of the intra- and extraconal fat. Symmetric extraocular muscles and optic nerves. Sinuses: No fluid levels or advanced mucosal thickening. Soft tissues: Normal visualized extracranial soft tissues. CT CERVICAL SPINE FINDINGS Alignment: No static subluxation. Facets are aligned. Occipital condyles and the lateral masses of C1-C2 are aligned. Skull base and vertebrae: No acute fracture. Soft tissues and spinal canal: No  prevertebral fluid or swelling. No visible canal hematoma. Disc levels: No advanced spinal canal or neural foraminal stenosis. Upper chest: No pneumothorax, pulmonary nodule or pleural effusion. Other: Normal visualized paraspinal cervical soft tissues. IMPRESSION: 1. No acute intracranial abnormality. 2. Large left frontal scalp hematoma without skull fracture or facial fracture. 3. No acute fracture or static subluxation of the cervical spine. Electronically Signed   By: Ulyses Jarred M.D.   On: 11/26/2021 02:31   CT Cervical Spine Wo Contrast  Result Date: 11/26/2021 CLINICAL DATA:  Fall EXAM: CT HEAD WITHOUT CONTRAST CT MAXILLOFACIAL WITHOUT CONTRAST CT CERVICAL SPINE WITHOUT CONTRAST TECHNIQUE: Multidetector CT imaging of the head, cervical spine, and maxillofacial structures were performed using the standard protocol without intravenous contrast. Multiplanar CT image reconstructions of the cervical spine and maxillofacial structures were also generated. RADIATION DOSE REDUCTION: This exam was performed according to the departmental dose-optimization program which includes automated exposure control, adjustment of the mA and/or kV according to patient size and/or use of iterative reconstruction technique. COMPARISON:  None. FINDINGS: CT HEAD FINDINGS Brain: There is no mass, hemorrhage or extra-axial collection. The size and  configuration of the ventricles and extra-axial CSF spaces are normal. There is hypoattenuation of the periventricular white matter, most commonly indicating chronic ischemic microangiopathy. Vascular: Atherosclerotic calcification of the internal carotid arteries at the skull base. No abnormal hyperdensity of the major intracranial arteries or dural venous sinuses. Skull: Large left frontal scalp hematoma.  No skull fracture. CT MAXILLOFACIAL FINDINGS Osseous: --Complex facial fracture types: No LeFort, zygomaticomaxillary complex or nasoorbitoethmoidal fracture. --Simple fracture  types: None. --Mandible: No fracture or dislocation. Orbits: The globes are intact. Normal appearance of the intra- and extraconal fat. Symmetric extraocular muscles and optic nerves. Sinuses: No fluid levels or advanced mucosal thickening. Soft tissues: Normal visualized extracranial soft tissues. CT CERVICAL SPINE FINDINGS Alignment: No static subluxation. Facets are aligned. Occipital condyles and the lateral masses of C1-C2 are aligned. Skull base and vertebrae: No acute fracture. Soft tissues and spinal canal: No prevertebral fluid or swelling. No visible canal hematoma. Disc levels: No advanced spinal canal or neural foraminal stenosis. Upper chest: No pneumothorax, pulmonary nodule or pleural effusion. Other: Normal visualized paraspinal cervical soft tissues. IMPRESSION: 1. No acute intracranial abnormality. 2. Large left frontal scalp hematoma without skull fracture or facial fracture. 3. No acute fracture or static subluxation of the cervical spine. Electronically Signed   By: Ulyses Jarred M.D.   On: 11/26/2021 02:31   DG Knee Complete 4 Views Right  Result Date: 11/26/2021 CLINICAL DATA:  Fall, rule out fracture. EXAM: RIGHT KNEE - COMPLETE 4+ VIEW COMPARISON:  None. FINDINGS: No acute fracture. Normal alignment, no dislocation. Chondrocalcinosis with mild peripheral spurring of the medial tibiofemoral compartment. No significant knee joint effusion. Advanced vascular calcifications. Generalized soft tissue edema. Linear soft tissue calcifications in the calf may be dystrophic. IMPRESSION: 1. No acute fracture or subluxation. 2. Generalized soft tissue edema. 3. Chondrocalcinosis and mild degenerative change. Electronically Signed   By: Keith Rake M.D.   On: 11/26/2021 02:01   CT Maxillofacial Wo Contrast  Result Date: 11/26/2021 CLINICAL DATA:  Fall EXAM: CT HEAD WITHOUT CONTRAST CT MAXILLOFACIAL WITHOUT CONTRAST CT CERVICAL SPINE WITHOUT CONTRAST TECHNIQUE: Multidetector CT imaging of the  head, cervical spine, and maxillofacial structures were performed using the standard protocol without intravenous contrast. Multiplanar CT image reconstructions of the cervical spine and maxillofacial structures were also generated. RADIATION DOSE REDUCTION: This exam was performed according to the departmental dose-optimization program which includes automated exposure control, adjustment of the mA and/or kV according to patient size and/or use of iterative reconstruction technique. COMPARISON:  None. FINDINGS: CT HEAD FINDINGS Brain: There is no mass, hemorrhage or extra-axial collection. The size and configuration of the ventricles and extra-axial CSF spaces are normal. There is hypoattenuation of the periventricular white matter, most commonly indicating chronic ischemic microangiopathy. Vascular: Atherosclerotic calcification of the internal carotid arteries at the skull base. No abnormal hyperdensity of the major intracranial arteries or dural venous sinuses. Skull: Large left frontal scalp hematoma.  No skull fracture. CT MAXILLOFACIAL FINDINGS Osseous: --Complex facial fracture types: No LeFort, zygomaticomaxillary complex or nasoorbitoethmoidal fracture. --Simple fracture types: None. --Mandible: No fracture or dislocation. Orbits: The globes are intact. Normal appearance of the intra- and extraconal fat. Symmetric extraocular muscles and optic nerves. Sinuses: No fluid levels or advanced mucosal thickening. Soft tissues: Normal visualized extracranial soft tissues. CT CERVICAL SPINE FINDINGS Alignment: No static subluxation. Facets are aligned. Occipital condyles and the lateral masses of C1-C2 are aligned. Skull base and vertebrae: No acute fracture. Soft tissues and spinal canal: No prevertebral fluid or swelling.  No visible canal hematoma. Disc levels: No advanced spinal canal or neural foraminal stenosis. Upper chest: No pneumothorax, pulmonary nodule or pleural effusion. Other: Normal visualized  paraspinal cervical soft tissues. IMPRESSION: 1. No acute intracranial abnormality. 2. Large left frontal scalp hematoma without skull fracture or facial fracture. 3. No acute fracture or static subluxation of the cervical spine. Electronically Signed   By: Ulyses Jarred M.D.   On: 11/26/2021 02:31     Labs:   Basic Metabolic Panel: Recent Labs  Lab 11/26/21 0356 11/27/21 0239 11/28/21 0220 11/29/21 0438 11/30/21 0322  NA 131* 132* 132* 133* 131*  K 3.7 3.8 3.5 3.8 4.3  CL 91* 93* 94* 95* 94*  CO2 '24 28 28 26 25  '$ GLUCOSE 233* 164* 162* 117* 225*  BUN '16 15 17 21 21  '$ CREATININE 1.71* 1.43* 1.26* 1.20* 1.13*  CALCIUM 9.0 8.9 9.2 9.0 9.3  MG  --  0.9* 1.6* 1.6* 1.7   GFR Estimated Creatinine Clearance: 59.6 mL/min (A) (by C-G formula based on SCr of 1.13 mg/dL (H)). Liver Function Tests: Recent Labs  Lab 11/26/21 0057  AST 25  ALT 21  ALKPHOS 70  BILITOT 1.4*  PROT 6.6  ALBUMIN 3.6   No results for input(s): LIPASE, AMYLASE in the last 168 hours. No results for input(s): AMMONIA in the last 168 hours. Coagulation profile No results for input(s): INR, PROTIME in the last 168 hours.  CBC: Recent Labs  Lab 11/26/21 0057 11/26/21 0356 11/27/21 0239 11/28/21 0220 11/29/21 0438 11/30/21 0322  WBC 12.8* 13.0* 15.7* 18.9* 17.4* 12.9*  NEUTROABS 8.5*  --  11.3* 15.0*  --   --   HGB 13.9 13.6 13.2 13.2 11.9* 12.3  HCT 40.1 40.7 37.9 36.3 35.0* 35.1*  MCV 91.3 91.3 89.2 87.7 90.0 89.5  PLT 235 225 235 205 225 215   Cardiac Enzymes: No results for input(s): CKTOTAL, CKMB, CKMBINDEX, TROPONINI in the last 168 hours. BNP: Invalid input(s): POCBNP CBG: Recent Labs  Lab 11/29/21 0744 11/29/21 1135 11/29/21 1631 11/29/21 2053 11/30/21 0725  GLUCAP 126* 181* 176* 198* 225*   D-Dimer No results for input(s): DDIMER in the last 72 hours. Hgb A1c No results for input(s): HGBA1C in the last 72 hours. Lipid Profile No results for input(s): CHOL, HDL, LDLCALC, TRIG,  CHOLHDL, LDLDIRECT in the last 72 hours. Thyroid function studies No results for input(s): TSH, T4TOTAL, T3FREE, THYROIDAB in the last 72 hours.  Invalid input(s): FREET3 Anemia work up No results for input(s): VITAMINB12, FOLATE, FERRITIN, TIBC, IRON, RETICCTPCT in the last 72 hours. Microbiology Recent Results (from the past 240 hour(s))  Resp Panel by RT-PCR (Flu A&B, Covid) Nasopharyngeal Swab     Status: None   Collection Time: 11/26/21  5:52 AM   Specimen: Nasopharyngeal Swab; Nasopharyngeal(NP) swabs in vial transport medium  Result Value Ref Range Status   SARS Coronavirus 2 by RT PCR NEGATIVE NEGATIVE Final    Comment: (NOTE) SARS-CoV-2 target nucleic acids are NOT DETECTED.  The SARS-CoV-2 RNA is generally detectable in upper respiratory specimens during the acute phase of infection. The lowest concentration of SARS-CoV-2 viral copies this assay can detect is 138 copies/mL. A negative result does not preclude SARS-Cov-2 infection and should not be used as the sole basis for treatment or other patient management decisions. A negative result may occur with  improper specimen collection/handling, submission of specimen other than nasopharyngeal swab, presence of viral mutation(s) within the areas targeted by this assay, and inadequate number of  viral copies(<138 copies/mL). A negative result must be combined with clinical observations, patient history, and epidemiological information. The expected result is Negative.  Fact Sheet for Patients:  EntrepreneurPulse.com.au  Fact Sheet for Healthcare Providers:  IncredibleEmployment.be  This test is no t yet approved or cleared by the [place] FDA and  has been authorized for detection and/or diagnosis of SARS-CoV-2 by FDA under an Emergency Use Authorization (EUA). This EUA will remain  in effect (meaning this test can be used) for the duration of the COVID-19 declaration under Section  564(b)(1) of the Act, 21 U.S.C.section 360bbb-3(b)(1), unless the authorization is terminated  or revoked sooner.       Influenza A by PCR NEGATIVE NEGATIVE Final   Influenza B by PCR NEGATIVE NEGATIVE Final    Comment: (NOTE) The Xpert Xpress SARS-CoV-2/FLU/RSV plus assay is intended as an aid in the diagnosis of influenza from Nasopharyngeal swab specimens and should not be used as a sole basis for treatment. Nasal washings and aspirates are unacceptable for Xpert Xpress SARS-CoV-2/FLU/RSV testing.  Fact Sheet for Patients: EntrepreneurPulse.com.au  Fact Sheet for Healthcare Providers: IncredibleEmployment.be  This test is not yet approved or cleared by the [place] FDA and has been authorized for detection and/or diagnosis of SARS-CoV-2 by FDA under an Emergency Use Authorization (EUA). This EUA will remain in effect (meaning this test can be used) for the duration of the COVID-19 declaration under Section 564(b)(1) of the Act, 21 U.S.C. section 360bbb-3(b)(1), unless the authorization is terminated or revoked.  Performed at [facility], [place] [address], [place], [place] [address]   Urine Culture     Status: None   Collection Time: 11/26/21 10:17 AM   Specimen: Urine, Catheterized  Result Value Ref Range Status   Specimen Description URINE, CATHETERIZED  Final   Special Requests NONE  Final   Culture   Final    NO GROWTH Performed at [facility], [address] [place], [place] [address]    Report Status 11/27/2021 FINAL  Final  Culture, blood (Routine X 2) w Reflex to ID Panel     Status: None (Preliminary result)   Collection Time: 11/26/21 10:54 AM   Specimen: BLOOD RIGHT HAND  Result Value Ref Range Status   Specimen Description BLOOD RIGHT HAND  Final   Special Requests   Final    BOTTLES DRAWN AEROBIC AND ANAEROBIC Blood Culture results may not be optimal due to an inadequate volume of blood received in  culture bottles   Culture   Final    NO GROWTH 3 DAYS Performed at [facility], [place] [address] [place], [place] [address]    Report Status PENDING  Incomplete  Culture, blood (Routine X 2) w Reflex to ID Panel     Status: None (Preliminary result)   Collection Time: 11/26/21  4:14 PM   Specimen: BLOOD  Result Value Ref Range Status   Specimen Description BLOOD SITE NOT SPECIFIED  Final   Special Requests   Final    BOTTLES DRAWN AEROBIC ONLY Blood Culture results may not be optimal due to an inadequate volume of blood received in culture bottles   Culture   Final    NO GROWTH 3 DAYS Performed at [facility], [place] [address] [place], [place] [address]    Report Status PENDING  Incomplete     Discharge Instructions:   Discharge Instructions     Ambulatory referral to Occupational Therapy   Complete  by: As directed    Ambulatory referral to Physical Therapy   Complete by: As directed    Ambulatory referral to Physical Therapy   Complete by: As directed    Diet - low sodium heart healthy   Complete by: As directed    Diet Carb Modified   Complete by: As directed    Increase activity slowly   Complete by: As directed       Allergies as of 11/30/2021       Reactions   Penicillins Hives, Itching, Swelling   Clindamycin/lincomycin    Other reaction(s): Unknown Trouble breathing   Dapagliflozin    Other reaction(s): Dizziness   Valsartan Other (See Comments)   Bradycardia        Medication List     STOP taking these medications    fluconazole 150 MG tablet Commonly known as: DIFLUCAN   hydrochlorothiazide 25 MG tablet Commonly known as: HYDRODIURIL       TAKE these medications    acetaminophen 650 MG CR tablet Commonly known as: TYLENOL Take 650 mg by mouth every 8 (eight) hours as needed for pain.   allopurinol 100 MG tablet Commonly known as: ZYLOPRIM TAKE 1 TABLET BY MOUTH ONCE DAILY   amLODipine 5 MG tablet Commonly known as:  NORVASC Take 1 tablet (5 mg total) by mouth daily.   atorvastatin 80 MG tablet Commonly known as: LIPITOR Take 1 tablet (80 mg total) by mouth daily. What changed: when to take this   cefdinir 300 MG capsule Commonly known as: OMNICEF Take 1 capsule (300 mg total) by mouth every 12 (twelve) hours.   clopidogrel 75 MG tablet Commonly known as: PLAVIX Take 1 tablet (75 mg total) by mouth daily. What changed: when to take this   colchicine 0.6 MG tablet take 1 tablet by mouth daily as needed What changed:  how much to take how to take this when to take this reasons to take this   cyclobenzaprine 10 MG tablet Commonly known as: FLEXERIL TAKE 1 TABLET BY MOUTH ONCE DAILY   FreeStyle Libre 2 Sensor Misc use to check blood sugar and change every 14 days   gabapentin 300 MG capsule Commonly known as: NEURONTIN TAKE 1 CAPSULE BY MOUTH ONCE DAILY What changed:  when to take this reasons to take this   HumuLIN R U-500 KwikPen 500 UNIT/ML KwikPen Generic drug: insulin regular human CONCENTRATED Inject 135 units under the skin every morning, inject 90 units at lunch time and inject 50 units with dinner as directed What changed:  how much to take when to take this additional instructions   magnesium oxide 400 (240 Mg) MG tablet Commonly known as: MAG-OX Take 1 tablet (400 mg total) by mouth 2 (two) times daily.   Natural Vitamin D-3 125 MCG (5000 UT) Tabs Generic drug: Cholecalciferol Take 5,000 Units by mouth daily.   nebivolol 10 MG tablet Commonly known as: Bystolic take 1 tablet by mouth twice a day. What changed:  how much to take how to take this when to take this   Pentips 31G X 5 MM Misc Generic drug: Insulin Pen Needle Use as directed 3 times a day   predniSONE 20 MG tablet Commonly known as: DELTASONE Take 1 tablet (20 mg total) by mouth daily before breakfast. Start taking on: December 01, 2021   Vitamin B-12 5000 MCG Tbdp Take 5,000 Units by mouth  daily.   vitamin E 180 MG (400 UNITS) capsule Generic drug: vitamin E Take  400 Units by mouth daily.               Durable Medical Equipment  (From admission, onward)           Start     Ordered   11/29/21 1543  For home use only DME 4 wheeled rolling walker with seat  Once       Question:  Patient needs a walker to treat with the following condition  Answer:  Debility   11/29/21 1542   11/29/21 1543  For home use only DME Shower stool  Once        11/29/21 1542   11/29/21 1541  For home use only DME 3 n 1  Once        11/29/21 1540   11/29/21 1339  For home use only DME Walker rolling  Once       Question Answer Comment  Walker: With [place] Wheels   Patient needs a walker to treat with the following condition Gait instability      11/29/21 1338            Follow-up Information     [facility] Follow up.   Specialty: Rehabilitation Why: Call to schedule first visit. Contact information: [address] [id number] mc [place] [phone]                 Time coordinating discharge: 4  Signed:  Geradine Girt DO  [facility] 11/30/2021, 8:27 AM

## 2021-12-01 LAB — CULTURE, BLOOD (ROUTINE X 2)
Culture: NO GROWTH
Culture: NO GROWTH

## 2021-12-05 DIAGNOSIS — E1169 Type 2 diabetes mellitus with other specified complication: Secondary | ICD-10-CM | POA: Diagnosis not present

## 2021-12-05 DIAGNOSIS — Z794 Long term (current) use of insulin: Secondary | ICD-10-CM | POA: Diagnosis not present

## 2021-12-05 DIAGNOSIS — S0083XD Contusion of other part of head, subsequent encounter: Secondary | ICD-10-CM | POA: Diagnosis not present

## 2021-12-05 DIAGNOSIS — E1122 Type 2 diabetes mellitus with diabetic chronic kidney disease: Secondary | ICD-10-CM | POA: Diagnosis not present

## 2021-12-05 DIAGNOSIS — E1159 Type 2 diabetes mellitus with other circulatory complications: Secondary | ICD-10-CM | POA: Diagnosis not present

## 2021-12-05 DIAGNOSIS — N183 Chronic kidney disease, stage 3 unspecified: Secondary | ICD-10-CM | POA: Diagnosis not present

## 2021-12-05 DIAGNOSIS — E785 Hyperlipidemia, unspecified: Secondary | ICD-10-CM | POA: Diagnosis not present

## 2021-12-05 DIAGNOSIS — I152 Hypertension secondary to endocrine disorders: Secondary | ICD-10-CM | POA: Diagnosis not present

## 2021-12-06 ENCOUNTER — Ambulatory Visit: Payer: PPO | Attending: Internal Medicine | Admitting: Physical Therapy

## 2021-12-06 ENCOUNTER — Other Ambulatory Visit: Payer: Self-pay

## 2021-12-06 VITALS — BP 165/89 | HR 76

## 2021-12-06 DIAGNOSIS — R2681 Unsteadiness on feet: Secondary | ICD-10-CM | POA: Insufficient documentation

## 2021-12-06 DIAGNOSIS — M25561 Pain in right knee: Secondary | ICD-10-CM | POA: Insufficient documentation

## 2021-12-06 DIAGNOSIS — R2689 Other abnormalities of gait and mobility: Secondary | ICD-10-CM | POA: Insufficient documentation

## 2021-12-06 DIAGNOSIS — W19XXXA Unspecified fall, initial encounter: Secondary | ICD-10-CM | POA: Diagnosis not present

## 2021-12-06 DIAGNOSIS — M6281 Muscle weakness (generalized): Secondary | ICD-10-CM | POA: Diagnosis not present

## 2021-12-06 DIAGNOSIS — N390 Urinary tract infection, site not specified: Secondary | ICD-10-CM | POA: Diagnosis not present

## 2021-12-06 DIAGNOSIS — X58XXXA Exposure to other specified factors, initial encounter: Secondary | ICD-10-CM | POA: Diagnosis not present

## 2021-12-06 NOTE — Therapy (Signed)
?OUTPATIENT PHYSICAL THERAPY LOWER EXTREMITY EVALUATION ? ? ?Patient Name: Kristine Gray ?MRN: [id number] ?DOB:Oct 17, 1950, 71 y.o., female ?Today's Date: 12/06/2021 ? ? PT End of Session - 12/06/21 1707   ? ? Visit Number 1   ? Date for PT Re-Evaluation 01/31/22   ? Authorization Type HTA - MCR   ? Progress Note Due on Visit 10   ? PT Start Time 1615   ? PT Stop Time 4944   ? PT Time Calculation (min) 37 min   ? ?  ?  ? ?  ? ? ?Past Medical History:  ?Diagnosis Date  ? Anemia   ? Arthritis 04-02-12  ? arthritis lower back and spurs  ? Carotid artery occlusion   ? Diabetes mellitus 04-02-12  ? oral med and insulin used  ? Diverticulosis of colon 04-02-12  ? hx. of, no problems  ? H/O nephrostomy 04-02-12  ? 03-19-12 tube placed and remains lt. flank.  ? Hypertension   ? Kidney calculi 04-02-12  ? hx. kidney stones-left, past hx. kidney stones  ? Obesity   ? ?Past Surgical History:  ?Procedure Laterality Date  ? CATARACT EXTRACTION, BILATERAL  04/2021  ? CESAREAN SECTION  04/02/2012  ? '86  ? CHOLECYSTECTOMY    ? DILATION AND CURETTAGE OF UTERUS  04/02/2012  ? ENDARTERECTOMY Left 07/16/2020  ? Procedure: ENDARTERECTOMY CAROTID;  Surgeon: Rosetta Posner, MD;  Location: [facility] OR;  Service: Vascular;  Laterality: Left;  ? iriodotomy  04/02/2012  ? bil. eyes  ? NEPHROLITHOTOMY  04/09/2012  ? Procedure: NEPHROLITHOTOMY PERCUTANEOUS;  Surgeon: Fredricka Bonine, MD;  Location: [facility] ORS;  Service: Urology;  Laterality: Left;  ? TONSILLECTOMY    ? TUBAL LIGATION  04/02/2012  ? '86  ? ?Patient Active Problem List  ? Diagnosis Date Noted  ? Hypomagnesemia 11/27/2021  ? Gout attack 11/27/2021  ? Near syncope 11/26/2021  ? UTI (urinary tract infection) 11/26/2021  ? TIA (transient ischemic attack) 07/14/2020  ? Pyelonephritis 03/19/2012  ? DM2 (diabetes mellitus, type 2) ([place]) 03/19/2012  ? HTN (hypertension) 03/19/2012  ? Hyperlipidemia 03/19/2012  ? Renal failure 03/19/2012  ? Sepsis(995.91) 03/19/2012  ? ? ?PCP: Bernerd Limbo,  MD ? ?REFERRING PROVIDER: Eugenie Filler, MD ? ?THERAPY DIAG:  ?Muscle weakness ? ?Unsteadiness on feet ? ?Other abnormalities of gait and mobility ? ?Right knee pain, unspecified chronicity ? ?REFERRING DIAG: Weakness [R53.1] ? ?SUBJECTIVE: ? ?PERTINENT PAST HISTORY:  ?Anemia, diabetes, HTN, FALLS, TIA ?     ?PRECAUTIONS: Fall, MONITOR BP ? ?WEIGHT BEARING RESTRICTIONS No ? ?FALLS:  ?Has patient fallen in last 6 months? No, Number of falls: 1 syncopal episode (3/2) ? ?LIVING ENVIRONMENT: ?Lives with: lives with their spouse ?Stairs: Yes; Internal: 10 steps; on right going up ? ?MOI/History of condition: ? ?Onset date: 11/24/21 ? ?Kristine Gray is a 71 y.o. female who presents to clinic with chief complaint of unsteadiness on feet and difficulty navigating steps.  She had a fall on 3/2 after going to the bathroom "I don't remember what happened".  She hit her head on a door frame during this episode.  The following day she went to the ER after throwing up.  She was admitted.  She was also found to to have UTI. ? ?From referring provider:  ? ?"Kristine Gray is a 71 y.o. female with history of TIA, diabetes mellitus type 2, hypertension, chronic kidney disease stage III was brought to the ER after patient was finding it difficult to  get up from the commode.  Patient husband checked her blood pressure was found it to be in systolic 82N.  EMS was called and patient was brought to the ER.  A day prior to this patient did have a fall and hit her face and sustained bruises on her scalp and around her eyes.  At that time patient decided to come to the ER.  Denies any chest pain or shortness of breath.  Since patient's fall and hurting her head has been had at least 4 episodes of vomiting. ?  ?ED Course: In the ER patient initial blood pressure was in the 003 systolic which improved with fluid bolus.  At the time of my exam blood pressure systolic in the 704U.  UA is concerning for UTI.  CT scan of the head and  maxillofacial done shows large scalp hematoma.  On exam patient also has periorbital hematoma.  X-rays do not show any fracture.  UA shows features concerning for UTI.  EKG shows normal sinus rhythm.  Patient admitted for further work-up and management.  COVID test is pending." ? ? Red flags:  ?denies  ? ?Pain:  ?Are you having pain? Yes ?Pain location: Dull pain from impact (periorbital hematoma) ?NPRS scale:  ?current 0/10  ?average 2/10  ?Aggravating factors: sitting after standing for long periods ? NPRS, highest: 3/10 ?Relieving factors: Rest ? NPRS: best: 3/10 ?Pain description: intermittent and throbbing ?Stage: Subacute ?Stability: getting better ? ?Occupation: retired ? ?Assistive Device: SPC cane ? ?Hand Dominance: R ? ?Patient Goals/Specific Activities: work on balance, step navigation ? ? ?PLOF: Independent ? ?DIAGNOSTIC FINDINGS: CT cleared for any red flags ? ? ?OBJECTIVE:  ? ? GENERAL OBSERVATION/GAIT: ?  Large hematoma on L forehead and L of L eye, reduced time in stance R LE ? ?SENSATION: ? Light touch: Appears intact ? ?PALPATION: ?TTP L knee peripatellar ? ?BP 165/80 ? ?LE MMT: ? ?MMT Right ?12/06/2021 Left ?12/06/2021  ?Hip flexion (L2, L3) 3+ 3+  ?Knee extension (L3) 4 4  ?Knee flexion 3+ 3+  ?Hip abduction    ?Hip extension    ?Hip external rotation    ?Hip internal rotation    ?Hip adduction    ?Ankle dorsiflexion (L4)    ?Ankle plantarflexion (S1)    ?Ankle inversion    ?Ankle eversion    ?Great Toe ext (L5)    ?Grossly    ? ?(Blank rows = not tested, score listed is out of 5 possible points.  N = WNL, D = diminished, C = clear for gross weakness with myotome testing, * = concordant pain with testing) ? ?LE ROM: ? ?ROM Right ?12/06/2021 Left ?12/06/2021  ?Hip flexion    ?Hip extension    ?Hip abduction    ?Hip adduction    ?Hip internal rotation    ?Hip external rotation    ?Knee flexion    ?Knee extension    ?Ankle dorsiflexion    ?Ankle plantarflexion    ?Ankle inversion    ?Ankle eversion     ? ?(Blank rows = not tested, N = WNL, * = concordant pain with testing) ? ?Functional Tests ? ?Functional tests Eval (12/06/2021)   ?BERG BALANCE TEST ?Sitting to Standing: 4.      Stands without using hands and stabilize independently ?Standing Unsupported: 4.      Stands safely for 2 minutes ?Sitting Unsupported: 4.     Sits for 2 minutes independently ?Standing to Sitting: 4.     Sits  safely with minimal use of hands ?Transfers: 4.     Transfers safely with minor use of hands ?Standing with eyes closed: 3.     Stands 10 seconds with supervision ?Standing with feet together: 4.     Stands for 1 minute safely ?Reaching forward with outstretched arm: 3.     Reaches forward 5 inches ?Retrieving object from the floor: 3.     Able to pick up with supervision ?Turning to look behind: 2.     Turns sideways only, maintains balance ?Turning 360 degrees: 2.     Able to turn slowly, but safely ?Place alternate foot on stool: 0.     Unable, needs assist to keep from falling ?Standing with one foot in front: 2.     Independent small step for 30 seconds ?Standing on one foot: 0.     Unable ? ?Total Score: 39/56 39   ?30'' STS w/ UE 6x   ?2 MWT 155 ft   ?    ?    ?    ?    ?    ?    ?    ?    ?    ?    ?    ? ? ?PATIENT SURVEYS:  ?FOTO 52 -> 59 ? ? ?TODAY'S TREATMENT: ?Creating, reviewing, and completing below HEP ? ? ?PATIENT EDUCATION:  ?POC, diagnosis, prognosis, HEP, and outcome measures.  Pt educated via explanation, demonstration, and handout (HEP).  Pt confirms understanding verbally.  ? ?ASTERISK SIGNS ? ? ?Asterisk Signs        ?balance        ?LE MMT        ?32'' STS        ?2 MWT        ?        ? ? ?HOME EXERCISE PROGRAM: ?Access Code: [email or web address] ?URL: [email or web address] ?Date: 12/06/2021 ?Prepared by: Shearon Balo ? ?Exercises ?Seated Hip Abduction with Resistance - 1 x daily - 7 x weekly - 3 sets - 10 reps ?Seated Hip Adduction Isometrics with Ball - 1 x daily - 7 x weekly - 1 sets - 10 reps - 10  hold ?Seated March with Resistance - 1 x daily - 7 x weekly - 3 sets - 10 reps ?Seated Long Arc Quad - 1 x daily - 7 x weekly - 3 sets - 10 reps ? ? ?ASSESSMENT: ? ?CLINICAL IMPRESSION: ?Alliyah is a 71 y.o. f

## 2021-12-08 ENCOUNTER — Encounter: Payer: Self-pay | Admitting: Vascular Surgery

## 2021-12-13 DIAGNOSIS — Z1211 Encounter for screening for malignant neoplasm of colon: Secondary | ICD-10-CM | POA: Diagnosis not present

## 2021-12-13 DIAGNOSIS — K648 Other hemorrhoids: Secondary | ICD-10-CM | POA: Diagnosis not present

## 2021-12-13 DIAGNOSIS — D124 Benign neoplasm of descending colon: Secondary | ICD-10-CM | POA: Diagnosis not present

## 2021-12-13 DIAGNOSIS — K635 Polyp of colon: Secondary | ICD-10-CM | POA: Diagnosis not present

## 2021-12-13 DIAGNOSIS — D122 Benign neoplasm of ascending colon: Secondary | ICD-10-CM | POA: Diagnosis not present

## 2021-12-13 DIAGNOSIS — D120 Benign neoplasm of cecum: Secondary | ICD-10-CM | POA: Diagnosis not present

## 2021-12-13 DIAGNOSIS — K5730 Diverticulosis of large intestine without perforation or abscess without bleeding: Secondary | ICD-10-CM | POA: Diagnosis not present

## 2021-12-16 ENCOUNTER — Ambulatory Visit: Payer: PPO

## 2021-12-16 ENCOUNTER — Other Ambulatory Visit: Payer: Self-pay

## 2021-12-16 DIAGNOSIS — R2681 Unsteadiness on feet: Secondary | ICD-10-CM

## 2021-12-16 DIAGNOSIS — M25561 Pain in right knee: Secondary | ICD-10-CM

## 2021-12-16 DIAGNOSIS — M6281 Muscle weakness (generalized): Secondary | ICD-10-CM | POA: Diagnosis not present

## 2021-12-16 DIAGNOSIS — R2689 Other abnormalities of gait and mobility: Secondary | ICD-10-CM

## 2021-12-16 NOTE — Therapy (Signed)
?OUTPATIENT PHYSICAL THERAPY TREATMENT NOTE ? ? ?Patient Name: Kristine Gray ?MRN: [id number] ?DOB:Feb 13, 1951, 71 y.o., female ?Today's Date: 12/16/2021 ? ?PCP: Bernerd Limbo, MD ?REFERRING PROVIDER: Eugenie Filler, MD ? ? PT End of Session - 12/16/21 1257   ? ? Visit Number 2   ? Date for PT Re-Evaluation 01/31/22   ? Authorization Type HTA - MCR   ? Progress Note Due on Visit 10   ? PT Start Time 7412   ? PT Stop Time 1340   ? PT Time Calculation (min) 43 min   ? Activity Tolerance Patient tolerated treatment well   ? Behavior During Therapy [facility] for tasks assessed/performed   ? ?  ?  ? ?  ? ? ?Past Medical History:  ?Diagnosis Date  ? Anemia   ? Arthritis 04-02-12  ? arthritis lower back and spurs  ? Carotid artery occlusion   ? Diabetes mellitus 04-02-12  ? oral med and insulin used  ? Diverticulosis of colon 04-02-12  ? hx. of, no problems  ? H/O nephrostomy 04-02-12  ? 03-19-12 tube placed and remains lt. flank.  ? Hypertension   ? Kidney calculi 04-02-12  ? hx. kidney stones-left, past hx. kidney stones  ? Obesity   ? ?Past Surgical History:  ?Procedure Laterality Date  ? CATARACT EXTRACTION, BILATERAL  04/2021  ? CESAREAN SECTION  04/02/2012  ? '86  ? CHOLECYSTECTOMY    ? DILATION AND CURETTAGE OF UTERUS  04/02/2012  ? ENDARTERECTOMY Left 07/16/2020  ? Procedure: ENDARTERECTOMY CAROTID;  Surgeon: Rosetta Posner, MD;  Location: [facility] OR;  Service: Vascular;  Laterality: Left;  ? iriodotomy  04/02/2012  ? bil. eyes  ? NEPHROLITHOTOMY  04/09/2012  ? Procedure: NEPHROLITHOTOMY PERCUTANEOUS;  Surgeon: Fredricka Bonine, MD;  Location: [facility] ORS;  Service: Urology;  Laterality: Left;  ? TONSILLECTOMY    ? TUBAL LIGATION  04/02/2012  ? '86  ? ?Patient Active Problem List  ? Diagnosis Date Noted  ? Hypomagnesemia 11/27/2021  ? Gout attack 11/27/2021  ? Near syncope 11/26/2021  ? UTI (urinary tract infection) 11/26/2021  ? TIA (transient ischemic attack) 07/14/2020  ? Pyelonephritis 03/19/2012  ? DM2 (diabetes mellitus,  type 2) ([place]) 03/19/2012  ? HTN (hypertension) 03/19/2012  ? Hyperlipidemia 03/19/2012  ? Renal failure 03/19/2012  ? Sepsis(995.91) 03/19/2012  ? ? ?REFERRING DIAG: Weakness [R53.1] ? ?THERAPY DIAG:  ?Muscle weakness ? ?Unsteadiness on feet ? ?Other abnormalities of gait and mobility ? ?Right knee pain, unspecified chronicity ? ?PERTINENT HISTORY: Anemia, diabetes, HTN, FALLS, TIA ? ?PRECAUTIONS: Fall, MONITOR BP ? ?Onset date: 11/24/21 ? ?SUBJECTIVE: Patient reports pain in her R knee from previous fall. ? ?PAIN:  ?Are you having pain? Yes ?Pain location: Dull pain from impact (periorbital hematoma) ?NPRS scale:  ?current 5/10   ?Aggravating factors: sitting after standing for long periods ?          NPRS, highest: 3/10 ?Relieving factors: Rest ?          NPRS: best: 3/10 ?Pain description: intermittent and throbbing ?Stage: Subacute ?Stability: getting better ? ? ? ?OBJECTIVE:  ?  ?          GENERAL OBSERVATION/GAIT: ?                    Large hematoma on L forehead and L of L eye, reduced time in stance R LE ?  ?SENSATION: ?         Light touch: Appears intact ?  ?  PALPATION: ?TTP L knee peripatellar ?  ?BP 165/80 ?  ?LE MMT: ?  ?MMT Right ?12/06/2021 Left ?12/06/2021  ?Hip flexion (L2, L3) 3+ 3+  ?Knee extension (L3) 4 4  ?Knee flexion 3+ 3+  ?Hip abduction      ?Hip extension      ?Hip external rotation      ?Hip internal rotation      ?Hip adduction      ?Ankle dorsiflexion (L4)      ?Ankle plantarflexion (S1)      ?Ankle inversion      ?Ankle eversion      ?Great Toe ext (L5)      ?Grossly      ?  ?(Blank rows = not tested, score listed is out of 5 possible points.  N = WNL, D = diminished, C = clear for gross weakness with myotome testing, * = concordant pain with testing) ?  ?LE ROM: ?  ?ROM Right ?12/06/2021 Left ?12/06/2021  ?Hip flexion      ?Hip extension      ?Hip abduction      ?Hip adduction      ?Hip internal rotation      ?Hip external rotation      ?Knee flexion      ?Knee extension      ?Ankle  dorsiflexion      ?Ankle plantarflexion      ?Ankle inversion      ?Ankle eversion      ?  ?(Blank rows = not tested, N = WNL, * = concordant pain with testing) ?  ?Functional Tests ?  ?Functional tests Eval (12/06/2021)    ?BERG BALANCE TEST ?Sitting to Standing: 4.      Stands without using hands and stabilize independently ?Standing Unsupported: 4.      Stands safely for 2 minutes ?Sitting Unsupported: 4.     Sits for 2 minutes independently ?Standing to Sitting: 4.     Sits safely with minimal use of hands ?Transfers: 4.     Transfers safely with minor use of hands ?Standing with eyes closed: 3.     Stands 10 seconds with supervision ?Standing with feet together: 4.     Stands for 1 minute safely ?Reaching forward with outstretched arm: 3.     Reaches forward 5 inches ?Retrieving object from the floor: 3.     Able to pick up with supervision ?Turning to look behind: 2.     Turns sideways only, maintains balance ?Turning 360 degrees: 2.     Able to turn slowly, but safely ?Place alternate foot on stool: 0.     Unable, needs assist to keep from falling ?Standing with one foot in front: 2.     Independent small step for 30 seconds ?Standing on one foot: 0.     Unable ?  ?Total Score: 39/56 39    ?30'' STS w/ UE 6x    ?2 MWT 155 ft    ?       ?       ?       ?       ?       ?       ?       ?       ?       ?       ?       ?  ?  ?PATIENT SURVEYS:  ?FOTO 17 -> 59 ?  ?  ?  TODAY'S TREATMENT: ?[facility] Adult PT Treatment:                                                DATE: 12/16/2021 ?Therapeutic Exercise: ?Check BP 140/74 ?Nustep level 2 x 5 mins ?Standing hip abduction BIL UE support @ counter x10 BIL ?Standing march BIL UE support @ counter 2x10 BIL ?Step ups forward 4" x10 BIL ?Step up and downs 4" L up R down x10 ?Seated march 2x10 BIL ?Seated LAQ 5" hold x 10 BIL ?Seated hip adduction 5" hold 2x10 ?STS x10 ? ? ?12/06/2021: ?Creating, reviewing, and completing below HEP ?  ?  ?PATIENT EDUCATION:  ?POC, diagnosis, prognosis,  HEP, and outcome measures.  Pt educated via explanation, demonstration, and handout (HEP).  Pt confirms understanding verbally.  ?  ?ASTERISK SIGNS ?  ?  ?Asterisk Signs              ?balance              ?LE MMT              ?69'' STS              ?2 MWT              ?               ?  ?  ?HOME EXERCISE PROGRAM: ?Access Code: [email or web address] ?URL: [email or web address] ?Date: 12/06/2021 ?Prepared by: Shearon Balo ?  ?Exercises ?Seated Hip Abduction with Resistance - 1 x daily - 7 x weekly - 3 sets - 10 reps ?Seated Hip Adduction Isometrics with Ball - 1 x daily - 7 x weekly - 1 sets - 10 reps - 10 hold ?Seated March with Resistance - 1 x daily - 7 x weekly - 3 sets - 10 reps ?Seated Long Arc Quad - 1 x daily - 7 x weekly - 3 sets - 10 reps ?  ?  ?ASSESSMENT: ?  ?CLINICAL IMPRESSION: ?Patient presents to therapy with moderate pain in her R knee and reports HEP compliance minus seated hip adduction due to no having a ball, stating the pillow didn't work well. Sh exhibits fear avoidance behavior with steps and she responds well to positive encouragement when working on step ups. She is progressing well with therapy and was able to complete all prescribed exercises with no adverse effects. Patient continues to benefit from skilled PT services and should be progressed as able to improve functional independence. ? ?  ?  ?OBJECTIVE IMPAIRMENTS: Pain, LE strength, hip strength, balance, gait ?  ?ACTIVITY LIMITATIONS: standing, walking, steps, ambulation in community ?  ?PERSONAL FACTORS: See medical history and pertinent history ?  ?  ?REHAB POTENTIAL: Good ?  ?CLINICAL DECISION MAKING: Stable/uncomplicated ?  ?EVALUATION COMPLEXITY: Low ?  ?  ?GOALS: ?  ?  ?SHORT TERM GOALS: ?  ?Rumi will be >75% HEP compliant to improve carryover between sessions and facilitate independent management of condition ?  ?Evaluation (12/06/2021): ongoing ?Target date: 01/31/2022 ?Goal status: INITIAL ?  ?  ?LONG TERM GOALS: ?   ?Lavayah will improve FOTO score to 52 as a proxy for functional improvement ?  ?Evaluation/Baseline (12/06/2021): 59 ?Target date: 01/31/2022 ?Goal status: INITIAL ?  ?  ?2.  Daleena will improve two minute walk tes

## 2021-12-27 ENCOUNTER — Encounter: Payer: Self-pay | Admitting: Physical Therapy

## 2021-12-27 ENCOUNTER — Ambulatory Visit: Payer: PPO | Attending: Internal Medicine | Admitting: Physical Therapy

## 2021-12-27 DIAGNOSIS — M6281 Muscle weakness (generalized): Secondary | ICD-10-CM | POA: Insufficient documentation

## 2021-12-27 DIAGNOSIS — M25561 Pain in right knee: Secondary | ICD-10-CM | POA: Diagnosis not present

## 2021-12-27 DIAGNOSIS — R2681 Unsteadiness on feet: Secondary | ICD-10-CM | POA: Insufficient documentation

## 2021-12-27 DIAGNOSIS — R2689 Other abnormalities of gait and mobility: Secondary | ICD-10-CM | POA: Diagnosis not present

## 2021-12-27 NOTE — Therapy (Signed)
?OUTPATIENT PHYSICAL THERAPY TREATMENT NOTE ? ? ?Patient Name: Kristine Gray ?MRN: [id number] ?DOB:Aug 24, 1951, 71 y.o., female ?Today's Date: 12/27/2021 ? ?PCP: Bernerd Limbo, MD ?REFERRING PROVIDER: Eugenie Filler, MD ? ? PT End of Session - 12/27/21 1529   ? ? Visit Number 3   ? Date for PT Re-Evaluation 01/31/22   ? Authorization Type HTA - MCR - FOTO   ? Progress Note Due on Visit 10   ? PT Start Time 1530   ? PT Stop Time 1612   ? PT Time Calculation (min) 42 min   ? Activity Tolerance Patient tolerated treatment well   ? Behavior During Therapy [facility] for tasks assessed/performed   ? ?  ?  ? ?  ? ? ? ?Past Medical History:  ?Diagnosis Date  ? Anemia   ? Arthritis 04-02-12  ? arthritis lower back and spurs  ? Carotid artery occlusion   ? Diabetes mellitus 04-02-12  ? oral med and insulin used  ? Diverticulosis of colon 04-02-12  ? hx. of, no problems  ? H/O nephrostomy 04-02-12  ? 03-19-12 tube placed and remains lt. flank.  ? Hypertension   ? Kidney calculi 04-02-12  ? hx. kidney stones-left, past hx. kidney stones  ? Obesity   ? ?Past Surgical History:  ?Procedure Laterality Date  ? CATARACT EXTRACTION, BILATERAL  04/2021  ? CESAREAN SECTION  04/02/2012  ? '86  ? CHOLECYSTECTOMY    ? DILATION AND CURETTAGE OF UTERUS  04/02/2012  ? ENDARTERECTOMY Left 07/16/2020  ? Procedure: ENDARTERECTOMY CAROTID;  Surgeon: Rosetta Posner, MD;  Location: [facility] OR;  Service: Vascular;  Laterality: Left;  ? iriodotomy  04/02/2012  ? bil. eyes  ? NEPHROLITHOTOMY  04/09/2012  ? Procedure: NEPHROLITHOTOMY PERCUTANEOUS;  Surgeon: Fredricka Bonine, MD;  Location: [facility] ORS;  Service: Urology;  Laterality: Left;  ? TONSILLECTOMY    ? TUBAL LIGATION  04/02/2012  ? '86  ? ?Patient Active Problem List  ? Diagnosis Date Noted  ? Hypomagnesemia 11/27/2021  ? Gout attack 11/27/2021  ? Near syncope 11/26/2021  ? UTI (urinary tract infection) 11/26/2021  ? TIA (transient ischemic attack) 07/14/2020  ? Pyelonephritis 03/19/2012  ? DM2 (diabetes  mellitus, type 2) ([place]) 03/19/2012  ? HTN (hypertension) 03/19/2012  ? Hyperlipidemia 03/19/2012  ? Renal failure 03/19/2012  ? Sepsis(995.91) 03/19/2012  ? ? ?REFERRING DIAG: Weakness [R53.1] ? ?THERAPY DIAG:  ?Muscle weakness ? ?Unsteadiness on feet ? ?Other abnormalities of gait and mobility ? ?Right knee pain, unspecified chronicity ? ?PERTINENT HISTORY: Anemia, diabetes, HTN, FALLS, TIA ? ?PRECAUTIONS: Fall, MONITOR BP ? ?Onset date: 11/24/21 ? ?SUBJECTIVE:  ? ?Pt reports that she is slowly improving.  She has reduced pain and improved endurance since starting PT. ? ?PAIN:  ?Are you having pain? Yes ?Pain location: Dull pain from impact (periorbital hematoma) ?NPRS scale:  ?current 2/10   ?Aggravating factors: sitting after standing for long periods ?Relieving factors: Rest ?Pain description: intermittent and throbbing ?Stage: Subacute ?Stability: getting better ? ? ? ?OBJECTIVE:  ?  ?          GENERAL OBSERVATION/GAIT: ?                    Large hematoma on L forehead and L of L eye, reduced time in stance R LE ?  ?SENSATION: ?         Light touch: Appears intact ?  ?PALPATION: ?TTP L knee peripatellar ?  ?BP 165/80 ?  ?LE  MMT: ?  ?MMT Right ?12/06/2021 Left ?12/06/2021  ?Hip flexion (L2, L3) 3+ 3+  ?Knee extension (L3) 4 4  ?Knee flexion 3+ 3+  ?Hip abduction      ?Hip extension      ?Hip external rotation      ?Hip internal rotation      ?Hip adduction      ?Ankle dorsiflexion (L4)      ?Ankle plantarflexion (S1)      ?Ankle inversion      ?Ankle eversion      ?Great Toe ext (L5)      ?Grossly      ?  ?(Blank rows = not tested, score listed is out of 5 possible points.  N = WNL, D = diminished, C = clear for gross weakness with myotome testing, * = concordant pain with testing) ?  ?LE ROM: ?  ?ROM Right ?12/06/2021 Left ?12/06/2021  ?Hip flexion      ?Hip extension      ?Hip abduction      ?Hip adduction      ?Hip internal rotation      ?Hip external rotation      ?Knee flexion      ?Knee extension      ?Ankle  dorsiflexion      ?Ankle plantarflexion      ?Ankle inversion      ?Ankle eversion      ?  ?(Blank rows = not tested, N = WNL, * = concordant pain with testing) ?  ?Functional Tests ?  ?Functional tests Eval (12/06/2021)    ?BERG BALANCE TEST ?Sitting to Standing: 4.      Stands without using hands and stabilize independently ?Standing Unsupported: 4.      Stands safely for 2 minutes ?Sitting Unsupported: 4.     Sits for 2 minutes independently ?Standing to Sitting: 4.     Sits safely with minimal use of hands ?Transfers: 4.     Transfers safely with minor use of hands ?Standing with eyes closed: 3.     Stands 10 seconds with supervision ?Standing with feet together: 4.     Stands for 1 minute safely ?Reaching forward with outstretched arm: 3.     Reaches forward 5 inches ?Retrieving object from the floor: 3.     Able to pick up with supervision ?Turning to look behind: 2.     Turns sideways only, maintains balance ?Turning 360 degrees: 2.     Able to turn slowly, but safely ?Place alternate foot on stool: 0.     Unable, needs assist to keep from falling ?Standing with one foot in front: 2.     Independent small step for 30 seconds ?Standing on one foot: 0.     Unable ?  ?Total Score: 39/56 39    ?30'' STS w/ UE 6x    ?2 MWT 155 ft    ?       ?       ?       ?       ?       ?       ?       ?       ?       ?       ?       ?  ?  ?PATIENT SURVEYS:  ?FOTO 78 -> 59 ? ? ASTERISK SIGNS ?  ?  ?Asterisk Signs              ?  balance              ?LE MMT              ?59'' STS              ?2 MWT              ?               ?  ?  ?HOME EXERCISE PROGRAM: ?Access Code: [email or web address] ?URL: [email or web address] ?Date: 12/27/2021 ?Prepared by: Shearon Balo ? ?Exercises ?- Standing Hip Abduction with Counter Support  - 1 x daily - 7 x weekly - 3 sets - 10 reps ?- Heel Raises with Counter Support  - 1 x daily - 7 x weekly - 3 sets - 10 reps ?- Standing March with Counter Support  - 1 x daily - 7 x weekly - 3 sets - 10  reps ?- Standing Hip Extension with Counter Support  - 1 x daily - 7 x weekly - 3 sets - 10 reps ?- Sit to Stand with Counter Support  - 1 x daily - 7 x weekly - 3 sets - 10 reps ?  ?TODAY'S TREATMENT: ? ?[facility]:                                                DATE: 12/27/2021 ?Therapeutic Exercise: ?Checking BP 152/83 ?Nustep level 2 x 5 mins ? ?Circuit consisting of: x3 rounds ?Standing hip abduction BIL UE support @ counter x10 BIL ?Standing march BIL UE support @ counter x10 BIL ?Standing HS curl x10 ea ?Standing hip ext 10 ea ?STS x10 ? ? ?Step ups forward 4" x10 BIL (NT) ? ?[facility] Adult PT Treatment:                                                DATE: 12/16/2021 ?Therapeutic Exercise: ?Check BP 140/74 ?Nustep level 2 x 5 mins ?Standing hip abduction BIL UE support @ counter x10 BIL ?Standing march BIL UE support @ counter 2x10 BIL ?Step ups forward 4" x10 BIL ?Step up and downs 4" L up R down x10 ?Seated march 2x10 BIL ?Seated LAQ 5" hold x 10 BIL ?Seated hip adduction 5" hold 2x10 ?STS x10 ? ? ?12/06/2021: ?Creating, reviewing, and completing below HEP ?   ?ASSESSMENT: ?  ?CLINICAL IMPRESSION: ?Laurissa did well with therapy today.  We moved to a circuit to try and improve exercise capacity.  She fatigues with exercise today but tolerates with no adverse events.  Requires min cuing for form.  Will continue to progress as able.  ?  ?OBJECTIVE IMPAIRMENTS: Pain, LE strength, hip strength, balance, gait ?  ?ACTIVITY LIMITATIONS: standing, walking, steps, ambulation in community ?  ?PERSONAL FACTORS: See medical history and pertinent history ?  ?  ?REHAB POTENTIAL: Good ?  ?CLINICAL DECISION MAKING: Stable/uncomplicated ?  ?EVALUATION COMPLEXITY: Low ?  ?  ?GOALS: ?  ?  ?SHORT TERM GOALS: ?  ?Marcella will be >75% HEP compliant to improve carryover between sessions and facilitate independent management of condition ?  ?Evaluation (12/06/2021): ongoing ?Target date: 01/31/2022 ?Goal status: INITIAL ?  ?   ?LONG TERM GOALS: ?  ?Darneshia  will improve FOTO score to 52 as a proxy for functional improvement ?  ?Evaluation/Baseline (12/06/2021): 59 ?Target date: 01/31/2022 ?Goal status: INITIAL ?  ?  ?2.  Arnesha will impro

## 2021-12-29 ENCOUNTER — Ambulatory Visit: Payer: PPO | Admitting: Physical Therapy

## 2021-12-29 ENCOUNTER — Encounter: Payer: Self-pay | Admitting: Physical Therapy

## 2021-12-29 DIAGNOSIS — R2689 Other abnormalities of gait and mobility: Secondary | ICD-10-CM

## 2021-12-29 DIAGNOSIS — M6281 Muscle weakness (generalized): Secondary | ICD-10-CM

## 2021-12-29 DIAGNOSIS — R2681 Unsteadiness on feet: Secondary | ICD-10-CM

## 2021-12-29 DIAGNOSIS — M25561 Pain in right knee: Secondary | ICD-10-CM

## 2021-12-29 NOTE — Therapy (Signed)
?OUTPATIENT PHYSICAL THERAPY TREATMENT NOTE ? ? ?Patient Name: Kristine Gray ?MRN: [id number] ?DOB:1951/07/26, 71 y.o., female ?Today's Date: 12/29/2021 ? ?PCP: Bernerd Limbo, MD ?REFERRING PROVIDER: Eugenie Filler, MD ? ? PT End of Session - 12/29/21 1615   ? ? Visit Number 4   ? Date for PT Re-Evaluation 01/31/22   ? Authorization Type HTA - MCR - FOTO   ? Progress Note Due on Visit 10   ? PT Start Time 1615   ? PT Stop Time 1448   ? PT Time Calculation (min) 43 min   ? Activity Tolerance Patient tolerated treatment well   ? Behavior During Therapy [facility] for tasks assessed/performed   ? ?  ?  ? ?  ? ? ? ?Past Medical History:  ?Diagnosis Date  ? Anemia   ? Arthritis 04-02-12  ? arthritis lower back and spurs  ? Carotid artery occlusion   ? Diabetes mellitus 04-02-12  ? oral med and insulin used  ? Diverticulosis of colon 04-02-12  ? hx. of, no problems  ? H/O nephrostomy 04-02-12  ? 03-19-12 tube placed and remains lt. flank.  ? Hypertension   ? Kidney calculi 04-02-12  ? hx. kidney stones-left, past hx. kidney stones  ? Obesity   ? ?Past Surgical History:  ?Procedure Laterality Date  ? CATARACT EXTRACTION, BILATERAL  04/2021  ? CESAREAN SECTION  04/02/2012  ? '86  ? CHOLECYSTECTOMY    ? DILATION AND CURETTAGE OF UTERUS  04/02/2012  ? ENDARTERECTOMY Left 07/16/2020  ? Procedure: ENDARTERECTOMY CAROTID;  Surgeon: Rosetta Posner, MD;  Location[facility] OR;  Service: Vascular;  Laterality: Left;  ? iriodotomy  04/02/2012  ? bil. eyes  ? NEPHROLITHOTOMY  04/09/2012  ? Procedure: NEPHROLITHOTOMY PERCUTANEOUS;  Surgeon: Fredricka Bonine, MD;  Location: [facility] OR[facility];  Service: Urology;  Laterality: Left;  ? TONSILLECTOMY    ? TUBAL LIGATION  04/02/2012  ? '86  ? ?Patient Active Problem List  ? Diagnosis Date Noted  ? Hypomagnesemia 11/27/2021  ? Gout attack 11/27/2021  ? Near syncope 11/26/2021  ? UTI (urinary tract infection) 11/26/2021  ? TIA (transient ischemic attack) 07/14/2020  ? Pyelonephritis 03/19/2012  ? DM2 (diabetes  mellitus, type 2) ([place]) 03/19/2012  ? HTN (hypertension) 03/19/2012  ? Hyperlipidemia 03/19/2012  ? Renal failure 03/19/2012  ? Sepsis(995.91) 03/19/2012  ? ? ?REFERRING DIAG: Weakness [R53.1] ? ?THERAPY DIAG:  ?Muscle weakness ? ?Unsteadiness on feet ? ?Other abnormalities of gait and mobility ? ?Right knee pain, unspecified chronicity ? ?PERTINENT HISTORY: Anemia, diabetes, HTN, FALLS, TIA ? ?PRECAUTIONS: Fall, MONITOR BP ? ?Onset date: 11/24/21 ? ?SUBJECTIVE:  ? ?Pt reports that she was tired after last session, but not sore. ? ?PAIN:  ?Are you having pain? Yes ?Pain location: Dull pain from impact (periorbital hematoma) ?NPRS scale:  ?current 2/10   ?Aggravating factors: sitting after standing for long periods ?Relieving factors: Rest ?Pain description: intermittent and throbbing ?Stage: Subacute ?Stability: getting better ? ? ? ?OBJECTIVE:  ?  ?          GENERAL OBSERVATION/GAIT: ?                    Large hematoma on L forehead and L of L eye, reduced time in stance R LE ?  ?SENSATION: ?         Light touch: Appears intact ?  ?PALPATION: ?TTP L knee peripatellar ?  ?BP 165/80 ?  ?LE MMT: ?  ?MMT Right ?12/06/2021  Left ?12/06/2021  ?Hip flexion (L2, L3) 3+ 3+  ?Knee extension (L3) 4 4  ?Knee flexion 3+ 3+  ?Hip abduction      ?Hip extension      ?Hip external rotation      ?Hip internal rotation      ?Hip adduction      ?Ankle dorsiflexion (L4)      ?Ankle plantarflexion (S1)      ?Ankle inversion      ?Ankle eversion      ?Great Toe ext (L5)      ?Grossly      ?  ?(Blank rows = not tested, score listed is out of 5 possible points.  N = WNL, D = diminished, C = clear for gross weakness with myotome testing, * = concordant pain with testing) ?  ?LE ROM: ?  ?ROM Right ?12/06/2021 Left ?12/06/2021  ?Hip flexion      ?Hip extension      ?Hip abduction      ?Hip adduction      ?Hip internal rotation      ?Hip external rotation      ?Knee flexion      ?Knee extension      ?Ankle dorsiflexion      ?Ankle plantarflexion       ?Ankle inversion      ?Ankle eversion      ?  ?(Blank rows = not tested, N = WNL, * = concordant pain with testing) ?  ?Functional Tests ?  ?Functional tests Eval (12/06/2021)    ?BERG BALANCE TEST ?Sitting to Standing: 4.      Stands without using hands and stabilize independently ?Standing Unsupported: 4.      Stands safely for 2 minutes ?Sitting Unsupported: 4.     Sits for 2 minutes independently ?Standing to Sitting: 4.     Sits safely with minimal use of hands ?Transfers: 4.     Transfers safely with minor use of hands ?Standing with eyes closed: 3.     Stands 10 seconds with supervision ?Standing with feet together: 4.     Stands for 1 minute safely ?Reaching forward with outstretched arm: 3.     Reaches forward 5 inches ?Retrieving object from the floor: 3.     Able to pick up with supervision ?Turning to look behind: 2.     Turns sideways only, maintains balance ?Turning 360 degrees: 2.     Able to turn slowly, but safely ?Place alternate foot on stool: 0.     Unable, needs assist to keep from falling ?Standing with one foot in front: 2.     Independent small step for 30 seconds ?Standing on one foot: 0.     Unable ?  ?Total Score: 39/56 39    ?30'' STS w/ UE 6x    ?2 MWT 155 ft    ?       ?       ?       ?       ?       ?       ?       ?       ?       ?       ?       ?  ?  ?PATIENT SURVEYS:  ?FOTO 27 -> 59 ? ? ASTERISK SIGNS ?  ?  ?Asterisk Signs              ?  balance              ?LE MMT              ?42'' STS              ?2 MWT              ?               ?  ?  ?HOME EXERCISE PROGRAM: ?Access Code: [email or web address] ?URL: [email or web address] ?Date: 12/27/2021 ?Prepared by: Shearon Balo ? ?Exercises ?- Standing Hip Abduction with Counter Support  - 1 x daily - 7 x weekly - 3 sets - 10 reps ?- Heel Raises with Counter Support  - 1 x daily - 7 x weekly - 3 sets - 10 reps ?- Standing March with Counter Support  - 1 x daily - 7 x weekly - 3 sets - 10 reps ?- Standing Hip Extension with Counter  Support  - 1 x daily - 7 x weekly - 3 sets - 10 reps ?- Sit to Stand with Counter Support  - 1 x daily - 7 x weekly - 3 sets - 10 reps ?  ?TODAY'S TREATMENT: ? ?[facility] Adult PT Treatment:                                                DATE: 12/29/2021 ?Therapeutic Exercise: ?Nustep level 6 x 5 mins - [id number] ? ?Circuit consisting of: x3 rounds ?Standing hip abduction BIL UE support @ counter x10 BIL ?Standing march BIL UE support @ counter x10 BIL ?Standing HS curl x10 ea ?Standing hip ext 10 ea ?STS x10 ?Step ups forward 4" x10 BIL ? ?[facility] Adult PT Treatment:                                                DATE: 12/27/2021 ?Therapeutic Exercise: ?Checking BP 152/83 ?Nustep level 2 x 5 mins ? ?Circuit consisting of: x3 rounds ?Standing hip abduction BIL UE support @ counter x10 BIL ?Standing march BIL UE support @ counter x10 BIL ?Standing HS curl x10 ea ?Standing hip ext 10 ea ?STS x10 ? ? ?Step ups forward 4" x10 BIL (NT) ? ?[facility] Adult PT Treatment:                                                DATE: 12/16/2021 ?Therapeutic Exercise: ?Check BP 140/74 ?Nustep level 2 x 5 mins ?Standing hip abduction BIL UE support @ counter x10 BIL ?Standing march BIL UE support @ counter 2x10 BIL ?Step ups forward 4" x10 BIL ?Step up and downs 4" L up R down x10 ?Seated march 2x10 BIL ?Seated LAQ 5" hold x 10 BIL ?Seated hip adduction 5" hold 2x10 ?STS x10 ? ? ?12/06/2021: ?Creating, reviewing, and completing below HEP ?   ?ASSESSMENT: ?  ?CLINICAL IMPRESSION: ?Kristine Gray did well with therapy today.  She was able to increase volume with addition of step ups in circuit.  She has significant fatigue but is able to complete all exercises with good form. ?  ?OBJECTIVE IMPAIRMENTS:  Pain, LE strength, hip strength, balance, gait ?  ?ACTIVITY LIMITATIONS: standing, walking, steps, ambulation in community ?  ?PERSONAL FACTORS: See medical history and pertinent history ?  ?  ?REHAB POTENTIAL: Good ?  ?CLINICAL DECISION MAKING: Stable/uncomplicated ?   ?EVALUATION COMPLEXITY: Low ?  ?  ?GOALS: ?  ?  ?SHORT TERM GOALS: ?  ?Kristine Gray will be >75% HEP compliant to improve carryover between sessions and facilitate independent management of condition ?  ?Evaluati

## 2022-01-06 ENCOUNTER — Other Ambulatory Visit: Payer: Self-pay | Admitting: *Deleted

## 2022-01-06 DIAGNOSIS — I6523 Occlusion and stenosis of bilateral carotid arteries: Secondary | ICD-10-CM

## 2022-01-10 DIAGNOSIS — Z794 Long term (current) use of insulin: Secondary | ICD-10-CM | POA: Diagnosis not present

## 2022-01-10 DIAGNOSIS — E785 Hyperlipidemia, unspecified: Secondary | ICD-10-CM | POA: Diagnosis not present

## 2022-01-10 DIAGNOSIS — M109 Gout, unspecified: Secondary | ICD-10-CM | POA: Diagnosis not present

## 2022-01-10 DIAGNOSIS — E559 Vitamin D deficiency, unspecified: Secondary | ICD-10-CM | POA: Diagnosis not present

## 2022-01-10 DIAGNOSIS — I129 Hypertensive chronic kidney disease with stage 1 through stage 4 chronic kidney disease, or unspecified chronic kidney disease: Secondary | ICD-10-CM | POA: Diagnosis not present

## 2022-01-10 DIAGNOSIS — N1832 Chronic kidney disease, stage 3b: Secondary | ICD-10-CM | POA: Diagnosis not present

## 2022-01-10 DIAGNOSIS — I6523 Occlusion and stenosis of bilateral carotid arteries: Secondary | ICD-10-CM | POA: Diagnosis not present

## 2022-01-10 DIAGNOSIS — E1129 Type 2 diabetes mellitus with other diabetic kidney complication: Secondary | ICD-10-CM | POA: Diagnosis not present

## 2022-01-10 DIAGNOSIS — I10 Essential (primary) hypertension: Secondary | ICD-10-CM | POA: Diagnosis not present

## 2022-01-10 DIAGNOSIS — I700 Atherosclerosis of aorta: Secondary | ICD-10-CM | POA: Diagnosis not present

## 2022-01-10 DIAGNOSIS — I5189 Other ill-defined heart diseases: Secondary | ICD-10-CM | POA: Diagnosis not present

## 2022-01-11 ENCOUNTER — Ambulatory Visit: Payer: PPO | Admitting: Vascular Surgery

## 2022-01-11 ENCOUNTER — Encounter: Payer: Self-pay | Admitting: Vascular Surgery

## 2022-01-11 ENCOUNTER — Ambulatory Visit (HOSPITAL_COMMUNITY)
Admission: RE | Admit: 2022-01-11 | Discharge: 2022-01-11 | Disposition: A | Discharge status: Home / Self Care | Payer: PPO | Source: Ambulatory Visit | Attending: Vascular Surgery | Admitting: Vascular Surgery

## 2022-01-11 VITALS — BP 161/83 | HR 74 | Temp 98.1°F | Resp 20 | Ht 64.0 in | Wt 267.0 lb

## 2022-01-11 DIAGNOSIS — I6523 Occlusion and stenosis of bilateral carotid arteries: Secondary | ICD-10-CM

## 2022-01-11 NOTE — Progress Notes (Signed)
? ?Patient ID: Kristine Gray, female   DOB: January 02, 1951, 71 y.o.   MRN: [id number] ? ?Reason for Consult: Follow-up ?  ?Referred by Bernerd Limbo, MD ? ?Subjective:  ?   ?HPI: ? ?Kristine Gray is a 71 y.o. female with history of left carotid endarterectomy for symptomatic disease performed as an inpatient.  She has no residual deficits from this.  She was initially thought to have high-grade stenosis of the right ICA this has never been confirmed with duplex.  She continues on Plavix and a statin drug.  Recently she did have a fall with significant facial bruising.  She denies any stroke, TIA or amaurosis. ? ?Past Medical History:  ?Diagnosis Date  ? Anemia   ? Arthritis 04-02-12  ? arthritis lower back and spurs  ? Carotid artery occlusion   ? Diabetes mellitus 04-02-12  ? oral med and insulin used  ? Diverticulosis of colon 04-02-12  ? hx. of, no problems  ? H/O nephrostomy 04-02-12  ? 03-19-12 tube placed and remains lt. flank.  ? Hypertension   ? Kidney calculi 04-02-12  ? hx. kidney stones-left, past hx. kidney stones  ? Obesity   ? ?Family History  ?Problem Relation Age of Onset  ? Cancer Other   ? Hypertension Other   ? Hyperlipidemia Other   ? Obesity Other   ? Heart attack Other   ? Breast cancer Maternal Aunt   ?     in mid 80's  ? ?Past Surgical History:  ?Procedure Laterality Date  ? CATARACT EXTRACTION, BILATERAL  04/2021  ? CESAREAN SECTION  04/02/2012  ? '86  ? CHOLECYSTECTOMY    ? DILATION AND CURETTAGE OF UTERUS  04/02/2012  ? ENDARTERECTOMY Left 07/16/2020  ? Procedure: ENDARTERECTOMY CAROTID;  Surgeon: Rosetta Posner, MD;  Location: [facility] OR;  Service: Vascular;  Laterality: Left;  ? iriodotomy  04/02/2012  ? bil. eyes  ? NEPHROLITHOTOMY  04/09/2012  ? Procedure: NEPHROLITHOTOMY PERCUTANEOUS;  Surgeon: Fredricka Bonine, MD;  Location: [facility] ORS;  Service: Urology;  Laterality: Left;  ? TONSILLECTOMY    ? TUBAL LIGATION  04/02/2012  ? '86  ? ? ?Short Social History:  ?Social History  ? ?Tobacco Use  ?  Smoking status: Never  ? Smokeless tobacco: Never  ?Substance Use Topics  ? Alcohol use: No  ? ? ?Allergies  ?Allergen Reactions  ? Penicillins Hives, Itching and Swelling  ? Clindamycin/Lincomycin   ?  Other reaction(s): Unknown ?Trouble breathing  ? Dapagliflozin   ?  Other reaction(s): Dizziness  ? Valsartan Other (See Comments)  ?  Bradycardia  ? ? ?Current Outpatient Medications  ?Medication Sig Dispense Refill  ? acetaminophen (TYLENOL) 650 MG CR tablet Take 650 mg by mouth every 8 (eight) hours as needed for pain.    ? allopurinol (ZYLOPRIM) 100 MG tablet TAKE 1 TABLET BY MOUTH ONCE DAILY 90 tablet 1  ? amLODipine (NORVASC) 5 MG tablet Take 1 tablet (5 mg total) by mouth daily. 30 tablet 0  ? atorvastatin (LIPITOR) 80 MG tablet Take 1 tablet (80 mg total) by mouth daily. (Patient taking differently: Take 80 mg by mouth every evening.) 90 tablet 4  ? cefdinir (OMNICEF) 300 MG capsule Take 1 capsule (300 mg total) by mouth every 12 (twelve) hours. 2 capsule 0  ? Cholecalciferol (NATURAL VITAMIN D-3) 125 MCG (5000 UT) TABS Take 5,000 Units by mouth daily.    ? clopidogrel (PLAVIX) 75 MG tablet Take 1 tablet (75 mg total)  by mouth daily. (Patient taking differently: Take 75 mg by mouth every evening.) 90 tablet 4  ? colchicine 0.6 MG tablet take 1 tablet by mouth daily as needed (Patient taking differently: Take 0.6 mg by mouth daily as needed (gout flare).) 90 tablet 3  ? Continuous Blood Gluc Sensor (FREESTYLE LIBRE 2 SENSOR) MISC use to check blood sugar and change every 14 days 9 each 3  ? Cyanocobalamin (VITAMIN B-12) 5000 MCG TBDP Take 5,000 Units by mouth daily.    ? cyclobenzaprine (FLEXERIL) 10 MG tablet TAKE 1 TABLET BY MOUTH ONCE DAILY 90 tablet 3  ? gabapentin (NEURONTIN) 300 MG capsule TAKE 1 CAPSULE BY MOUTH ONCE DAILY (Patient taking differently: Take 300 mg by mouth daily as needed (pain).) 90 capsule 3  ? Insulin Pen Needle 31G X 5 MM MISC Use as directed 3 times a day 300 each 3  ? insulin  regular human CONCENTRATED (HUMULIN R U-500 KWIKPEN) 500 UNIT/ML KwikPen Inject 135 units under the skin every morning, inject 90 units at lunch time and inject 50 units with dinner as directed (Patient taking differently: Inject 50-135 Units into the skin See admin instructions. 135 units in the morning ?90 units at lunch ?50 units with dinner) 45 mL 3  ? magnesium oxide (MAG-OX) 400 (240 Mg) MG tablet Take 1 tablet (400 mg total) by mouth 2 (two) times daily. 120 tablet 0  ? nebivolol (BYSTOLIC) 10 MG tablet take 1 tablet by mouth twice a day. (Patient taking differently: Take 10 mg by mouth in the morning and at bedtime.) 270 tablet 3  ? predniSONE (DELTASONE) 20 MG tablet Take 1 tablet (20 mg total) by mouth daily before breakfast. 3 tablet 0  ? vitamin E 180 MG (400 UNITS) capsule Take 400 Units by mouth daily.    ? ?No current facility-administered medications for this visit.  ? ? ?Review of Systems  ?Constitutional:  Constitutional negative. ?HENT: HENT negative.  ?Eyes: Eyes negative.  ?Respiratory: Respiratory negative.  ?Cardiovascular: Cardiovascular negative.  ?GI: Gastrointestinal negative.  ?Musculoskeletal: Musculoskeletal negative.  ?Skin: Skin negative.  ?Neurological: Neurological negative. ?Hematologic: Positive for bruises/bleeds easily.  ?Psychiatric: Psychiatric negative.   ? ?   ?Objective:  ?Objective  ? ?Vitals:  ? 01/11/22 1116  ?BP: (!) 161/83  ?Pulse: 74  ?Resp: 20  ?Temp: 98.1 ?F (36.7 ?C)  ?SpO2: 95%  ?Weight: 267 lb (121.1 kg)  ?Height: '5\' 4"'$  (1.626 m)  ? ?Body mass index is 45.83 kg/m?. ? ?Physical Exam ?HENT:  ?   Head: Normocephalic.  ?   Nose: Nose normal.  ?Eyes:  ?   Pupils: Pupils are equal, round, and reactive to light.  ?Neck:  ?   Vascular: No carotid bruit.  ?   Comments: Well-healed left neck incision ?Cardiovascular:  ?   Rate and Rhythm: Normal rate.  ?   Pulses: Normal pulses.  ?Abdominal:  ?   General: Abdomen is flat.  ?   Palpations: Abdomen is soft.   ?Musculoskeletal:     ?   General: Normal range of motion.  ?   Right lower leg: No edema.  ?   Left lower leg: No edema.  ?Skin: ?   General: Skin is warm and dry.  ?Neurological:  ?   General: No focal deficit present.  ?   Mental Status: She is alert.  ?Psychiatric:     ?   Mood and Affect: Mood normal.     ?   Behavior: Behavior normal.     ?  Thought Content: Thought content normal.  ? ? ?Data: ?Right Carotid Findings:  ?+----------+--------+--------+--------+---------------------+--------------  ?----+  ?          PSV cm/sEDV cm/sStenosisPlaque Description   Comments        ?      ?+----------+--------+--------+--------+---------------------+--------------  ?----+  ?CCA Prox  88      5                                                    ?      ?+----------+--------+--------+--------+---------------------+--------------  ?----+  ?CCA Mid   87      12                                                   ?      ?+----------+--------+--------+--------+---------------------+--------------  ?----+  ?CCA Distal73      8               heterogenous and                     ?      ?                                  calcific                             ?      ?+----------+--------+--------+--------+---------------------+--------------  ?----+  ?ICA Prox  203     21      1-39%   calcific and         High  ?resistant      ?                                  hypoechoic           flow            ?      ?+----------+--------+--------+--------+---------------------+--------------  ?----+  ?ICA Mid   85      19                                                   ?      ?+----------+--------+--------+--------+---------------------+--------------  ?----+  ?ICA Distal75      15                                                   ?      ?+----------+--------+--------+--------+---------------------+--------------  ?----+  ?ECA       136     3               calcific                              ?      ?+----------+--------+--------+--------+---------------------+--------------  ?----+  ? ?+----------+--------+-------+----------------+-------------------+  ?  PSV cm/sEDV cmsDescribe        Arm Pressure (mmHG)  ?+----------+--------+-------+------------

## 2022-01-13 ENCOUNTER — Ambulatory Visit: Payer: PPO | Admitting: Physical Therapy

## 2022-01-20 ENCOUNTER — Ambulatory Visit: Payer: PPO | Admitting: Physical Therapy

## 2022-01-20 ENCOUNTER — Encounter: Payer: Self-pay | Admitting: Physical Therapy

## 2022-01-20 DIAGNOSIS — R2689 Other abnormalities of gait and mobility: Secondary | ICD-10-CM

## 2022-01-20 DIAGNOSIS — M6281 Muscle weakness (generalized): Secondary | ICD-10-CM

## 2022-01-20 DIAGNOSIS — R2681 Unsteadiness on feet: Secondary | ICD-10-CM

## 2022-01-20 DIAGNOSIS — M25561 Pain in right knee: Secondary | ICD-10-CM

## 2022-01-20 NOTE — Therapy (Signed)
?PHYSICAL THERAPY DISCHARGE SUMMARY ? ?Visits from Start of Care: 5 ? ?Current functional level related to goals / functional outcomes: ?See assessment/goals ?  ?Remaining deficits: ?See assessment/goals ?  ?Education / Equipment: ?HEP and D/C plans ? ?Patient agrees to discharge. Patient goals were partially met. Patient is being discharged due to being pleased with the current functional level. ? ? ?Patient Name: Kristine Gray ?MRN: [id number] ?DOB:1950/10/08, 71 y.o., female ?Today's Date: 01/20/2022 ? ?PCP: Bernerd Limbo, MD ?REFERRING PROVIDER: Eugenie Filler, MD ? ? PT End of Session - 01/20/22 1258   ? ? Visit Number 5   ? Date for PT Re-Evaluation 01/31/22   ? Authorization Type HTA - MCR - FOTO   ? Progress Note Due on Visit 10   ? PT Start Time 1300   ? PT Stop Time 1330   ? PT Time Calculation (min) 30 min   ? Activity Tolerance Patient tolerated treatment well   ? Behavior During Therapy [facility] for tasks assessed/performed   ? ?  ?  ? ?  ? ? ? ?Past Medical History:  ?Diagnosis Date  ? Anemia   ? Arthritis 04-02-12  ? arthritis lower back and spurs  ? Carotid artery occlusion   ? Diabetes mellitus 04-02-12  ? oral med and insulin used  ? Diverticulosis of colon 04-02-12  ? hx. of, no problems  ? H/O nephrostomy 04-02-12  ? 03-19-12 tube placed and remains lt. flank.  ? Hypertension   ? Kidney calculi 04-02-12  ? hx. kidney stones-left, past hx. kidney stones  ? Obesity   ? ?Past Surgical History:  ?Procedure Laterality Date  ? CATARACT EXTRACTION, BILATERAL  04/2021  ? CESAREAN SECTION  04/02/2012  ? '86  ? CHOLECYSTECTOMY    ? DILATION AND CURETTAGE OF UTERUS  04/02/2012  ? ENDARTERECTOMY Left 07/16/2020  ? Procedure: ENDARTERECTOMY CAROTID;  Surgeon: Rosetta Posner, MD;  Location: [facility] OR;  Service: Vascular;  Laterality: Left;  ? iriodotomy  04/02/2012  ? bil. eyes  ? NEPHROLITHOTOMY  04/09/2012  ? Procedure: NEPHROLITHOTOMY PERCUTANEOUS;  Surgeon: Fredricka Bonine, MD;  Location: [facility] ORS;  Service:  Urology;  Laterality: Left;  ? TONSILLECTOMY    ? TUBAL LIGATION  04/02/2012  ? '86  ? ?Patient Active Problem List  ? Diagnosis Date Noted  ? Hypomagnesemia 11/27/2021  ? Gout attack 11/27/2021  ? Near syncope 11/26/2021  ? UTI (urinary tract infection) 11/26/2021  ? TIA (transient ischemic attack) 07/14/2020  ? Pyelonephritis 03/19/2012  ? DM2 (diabetes mellitus, type 2) ([place]) 03/19/2012  ? HTN (hypertension) 03/19/2012  ? Hyperlipidemia 03/19/2012  ? Renal failure 03/19/2012  ? Sepsis(995.91) 03/19/2012  ? ? ?REFERRING DIAG: Weakness [R53.1] ? ?THERAPY DIAG:  ?Muscle weakness ? ?Unsteadiness on feet ? ?Other abnormalities of gait and mobility ? ?Right knee pain, unspecified chronicity ? ?PERTINENT HISTORY: Anemia, diabetes, HTN, FALLS, TIA ? ?PRECAUTIONS: Fall, MONITOR BP ? ?Onset date: 11/24/21 ? ?SUBJECTIVE:  ? ?Pt reports that she is pleased with her progress, feels she is improving with HEP, and wonders about D/C today d/t being happy with her current function. ? ?PAIN:  ?Are you having pain? Yes ?Pain location: Dull pain from impact (periorbital hematoma) ?NPRS scale:  ?current 2/10   ?Aggravating factors: sitting after standing for long periods ?Relieving factors: Rest ?Pain description: intermittent and throbbing ?Stage: Subacute ?Stability: getting better ? ? ? ?OBJECTIVE:  ?  ?          GENERAL OBSERVATION/GAIT: ?  Large hematoma on L forehead and L of L eye, reduced time in stance R LE ?  ?SENSATION: ?         Light touch: Appears intact ?  ?PALPATION: ?TTP L knee peripatellar ?  ?LE MMT: ?  ?MMT Right ?12/06/2021 Left ?12/06/2021  ?Hip flexion (L2, L3) 3+ 3+  ?Knee extension (L3) 4 4  ?Knee flexion 3+ 3+  ?Hip abduction      ?Hip extension      ?Hip external rotation      ?Hip internal rotation      ?Hip adduction      ?Ankle dorsiflexion (L4)      ?Ankle plantarflexion (S1)      ?Ankle inversion      ?Ankle eversion      ?Great Toe ext (L5)      ?Grossly      ?  ?(Blank rows = not  tested, score listed is out of 5 possible points.  N = WNL, D = diminished, C = clear for gross weakness with myotome testing, * = concordant pain with testing) ?  ?LE ROM: ?  ?ROM Right ?12/06/2021 Left ?12/06/2021  ?Hip flexion      ?Hip extension      ?Hip abduction      ?Hip adduction      ?Hip internal rotation      ?Hip external rotation      ?Knee flexion      ?Knee extension      ?Ankle dorsiflexion      ?Ankle plantarflexion      ?Ankle inversion      ?Ankle eversion      ?  ?(Blank rows = not tested, N = WNL, * = concordant pain with testing) ?  ?Functional Tests ?  ?Functional tests Eval (12/06/2021) 4/28  ?BERG BALANCE TEST ?Sitting to Standing: 4.      Stands without using hands and stabilize independently ?Standing Unsupported: 4.      Stands safely for 2 minutes ?Sitting Unsupported: 4.     Sits for 2 minutes independently ?Standing to Sitting: 4.     Sits safely with minimal use of hands ?Transfers: 4.     Transfers safely with minor use of hands ?Standing with eyes closed: 3.     Stands 10 seconds with supervision ?Standing with feet together: 4.     Stands for 1 minute safely ?Reaching forward with outstretched arm: 3.     Reaches forward 5 inches ?Retrieving object from the floor: 3.     Able to pick up with supervision ?Turning to look behind: 2.     Turns sideways only, maintains balance ?Turning 360 degrees: 2.     Able to turn slowly, but safely ?Place alternate foot on stool: 0.     Unable, needs assist to keep from falling ?Standing with one foot in front: 2.     Independent small step for 30 seconds ?Standing on one foot: 0.     Unable ?  ?Total Score: 39/56 39 BERG BALANCE ? ?Sitting to Standing: Numbers; 0-4: 4 ? 4. Stands without using hands and stabilize independently ? 3. Stands independently using hands ? 2. Stands using hands after multiple trials ? 1. Min A to stand ? 0. Mod-Max A to stand ?Standing unsupported: Numbers; 0-4: 4 ? 4. Stands safely for 2 minutes ? 3. Stands 2 minutes  with supervision ? 2. Stands 30 seconds unsupported ? 1. Needs several tries to stand  unsupported for 30 seconds ? 0. Unable to stand unsupported for 30 seconds ?Sitting unsupported: Numbers; 0-4: 4 ? 4. Sits for 2 minutes independently ? 3. Sits for 2 minutes with supervision ? 2. Able to sit 30 seconds ? 1. Able to sit 10 seconds ? 0. Unable to sit for 10 seconds ?Standing to Sitting: Numbers; 0-4: 4 ?4. Sits safely with minimal use of hands ?3. Controls descent with hands ?2. Uses back of legs against chair to control descent ?1. Sits independently, but uncontrolled descent ?0. Needs assistance ?Transfers: Numbers; 0-4: 4 ? 4. Transfers safely with minor use of hands ? 3. Transfers safely definite use of hands ? 2. Transfers with verbal cueing/supervision ? 1. Needs 1 person assist ? 0. Needs 2 person assist  ?Standing with eyes closed: Numbers; 0-4: 4 ? 4. Stands safely for 10 seconds ? 3. Stands 10 seconds with supervision  ? 2. Able to stand for 3 seconds ? 1. Unable to keep eyes closed for 3 seconds, but is safe ? 0. Needs assist to keep from falling ?Standing with feet together: Numbers; 0-4: 4 ?4. Stands for 1 minute safely ?3. Stands for 1 minute with supervision ?2. Unable to hold for 30 seconds ? 1. Needs help to attain position but can hold for 15 seconds ? 0. Needs help to attain position and unable to hold for 15 seconds ?Reaching forward with outstretched arm: Numbers; 0-4: 3 ? 4. Reaches forward 10 inches ? 3. Reaches forward 5 inches ? 2. Reaches forward 2 inches ? 1. Reaches forward with supervision ? 0. Loses balance/requires assistace ?Retrieving object from the floor: Numbers; 0-4: 4 ?4. Able to pick up easily and safely ?3. Able to pick up with supervision ?2. Unable to pick up, but reaches within 1-2 inches independently ?1. Unable to pick up and needs supervision ?0. Unable/needs assistance to keep from falling  ?Turning to look behind: Numbers; 0-4: 4 ? 4. Looks behind from both sides and  weight shifts well ? 3. Looks behind one side only, other side less weight shift ? 2. Turns sideways only, maintains balance ? 1. Needs supervision when turning ? 0. Needs assistance  ?Turning 360 degrees: Numbers;

## 2022-02-01 ENCOUNTER — Other Ambulatory Visit (HOSPITAL_BASED_OUTPATIENT_CLINIC_OR_DEPARTMENT_OTHER): Payer: Self-pay

## 2022-02-02 ENCOUNTER — Other Ambulatory Visit (HOSPITAL_BASED_OUTPATIENT_CLINIC_OR_DEPARTMENT_OTHER): Payer: Self-pay

## 2022-02-02 MED ORDER — COLCHICINE 0.6 MG PO TABS
0.6000 mg | ORAL_TABLET | Freq: Every day | ORAL | 1 refills | Status: AC | PRN
  Filled 2022-02-02: qty 90, 90d supply, fill #0

## 2022-02-02 MED ORDER — ALLOPURINOL 100 MG PO TABS
100.0000 mg | ORAL_TABLET | Freq: Every day | ORAL | 1 refills | Status: DC
  Filled 2022-02-02: qty 90, 90d supply, fill #0
  Filled 2022-05-07: qty 90, 90d supply, fill #1

## 2022-02-02 MED ORDER — HUMULIN R U-500 KWIKPEN 500 UNIT/ML ~~LOC~~ SOPN
PEN_INJECTOR | SUBCUTANEOUS | 2 refills | Status: DC
Start: 2022-02-02 — End: 2022-12-05
  Filled 2022-02-02: qty 48, 88d supply, fill #0
  Filled 2022-05-07: qty 48, 88d supply, fill #1
  Filled 2022-08-08: qty 48, 88d supply, fill #2
  Filled 2022-11-13: qty 6, 10d supply, fill #3

## 2022-02-03 ENCOUNTER — Encounter: Payer: PPO | Admitting: Physical Therapy

## 2022-02-03 ENCOUNTER — Other Ambulatory Visit (HOSPITAL_BASED_OUTPATIENT_CLINIC_OR_DEPARTMENT_OTHER): Payer: Self-pay

## 2022-02-08 ENCOUNTER — Other Ambulatory Visit (HOSPITAL_BASED_OUTPATIENT_CLINIC_OR_DEPARTMENT_OTHER): Payer: Self-pay

## 2022-02-09 ENCOUNTER — Other Ambulatory Visit (HOSPITAL_BASED_OUTPATIENT_CLINIC_OR_DEPARTMENT_OTHER): Payer: Self-pay

## 2022-02-09 MED ORDER — FREESTYLE LIBRE 2 SENSOR MISC
3 refills | Status: DC
  Filled 2022-02-09: qty 6, 84d supply, fill #0
  Filled 2022-05-07: qty 6, 84d supply, fill #1
  Filled 2022-08-08: qty 6, 84d supply, fill #2
  Filled 2022-10-18: qty 6, 84d supply, fill #3

## 2022-02-10 ENCOUNTER — Encounter: Payer: PPO | Admitting: Physical Therapy

## 2022-02-13 ENCOUNTER — Other Ambulatory Visit (HOSPITAL_BASED_OUTPATIENT_CLINIC_OR_DEPARTMENT_OTHER): Payer: Self-pay

## 2022-02-21 ENCOUNTER — Other Ambulatory Visit (HOSPITAL_BASED_OUTPATIENT_CLINIC_OR_DEPARTMENT_OTHER): Payer: Self-pay

## 2022-02-24 ENCOUNTER — Other Ambulatory Visit (HOSPITAL_BASED_OUTPATIENT_CLINIC_OR_DEPARTMENT_OTHER): Payer: Self-pay

## 2022-02-28 DIAGNOSIS — Z794 Long term (current) use of insulin: Secondary | ICD-10-CM | POA: Diagnosis not present

## 2022-02-28 DIAGNOSIS — I129 Hypertensive chronic kidney disease with stage 1 through stage 4 chronic kidney disease, or unspecified chronic kidney disease: Secondary | ICD-10-CM | POA: Diagnosis not present

## 2022-02-28 DIAGNOSIS — E1129 Type 2 diabetes mellitus with other diabetic kidney complication: Secondary | ICD-10-CM | POA: Diagnosis not present

## 2022-02-28 DIAGNOSIS — N1832 Chronic kidney disease, stage 3b: Secondary | ICD-10-CM | POA: Diagnosis not present

## 2022-02-28 DIAGNOSIS — E785 Hyperlipidemia, unspecified: Secondary | ICD-10-CM | POA: Diagnosis not present

## 2022-03-13 DIAGNOSIS — E785 Hyperlipidemia, unspecified: Secondary | ICD-10-CM | POA: Diagnosis not present

## 2022-03-13 DIAGNOSIS — N183 Chronic kidney disease, stage 3 unspecified: Secondary | ICD-10-CM | POA: Diagnosis not present

## 2022-03-13 DIAGNOSIS — Z794 Long term (current) use of insulin: Secondary | ICD-10-CM | POA: Diagnosis not present

## 2022-03-13 DIAGNOSIS — I152 Hypertension secondary to endocrine disorders: Secondary | ICD-10-CM | POA: Diagnosis not present

## 2022-03-13 DIAGNOSIS — E1169 Type 2 diabetes mellitus with other specified complication: Secondary | ICD-10-CM | POA: Diagnosis not present

## 2022-03-13 DIAGNOSIS — E1159 Type 2 diabetes mellitus with other circulatory complications: Secondary | ICD-10-CM | POA: Diagnosis not present

## 2022-03-13 DIAGNOSIS — E1122 Type 2 diabetes mellitus with diabetic chronic kidney disease: Secondary | ICD-10-CM | POA: Diagnosis not present

## 2022-03-30 DIAGNOSIS — H43813 Vitreous degeneration, bilateral: Secondary | ICD-10-CM | POA: Diagnosis not present

## 2022-03-30 DIAGNOSIS — E119 Type 2 diabetes mellitus without complications: Secondary | ICD-10-CM | POA: Diagnosis not present

## 2022-03-30 DIAGNOSIS — H04123 Dry eye syndrome of bilateral lacrimal glands: Secondary | ICD-10-CM | POA: Diagnosis not present

## 2022-03-30 DIAGNOSIS — H402231 Chronic angle-closure glaucoma, bilateral, mild stage: Secondary | ICD-10-CM | POA: Diagnosis not present

## 2022-04-13 ENCOUNTER — Other Ambulatory Visit (HOSPITAL_BASED_OUTPATIENT_CLINIC_OR_DEPARTMENT_OTHER): Payer: Self-pay

## 2022-05-01 ENCOUNTER — Ambulatory Visit: Payer: Self-pay

## 2022-05-01 NOTE — Patient Instructions (Signed)
Visit Information  Thank you for taking time to visit with me today. Please don't hesitate to contact me if I can be of assistance to you.   Following are the goals we discussed today:   Goals Addressed             This Visit's Progress    COMPLETED: Care Coordination Activities - no follow up needed       Care Coordination Interventions: SDoH screening completed - no acute challenges identified Fall risk assessment performed- patient is a moderate risk due to one fall within the last year which results in a hospitalization. Patient had a syncopal episode and hit her head. During inpatient stay patient medications were adjusted and she completed outpatient PT, no concerns at this time re: fall management Discussed patient does not have difficulty affording and/or adhering to medications Reviewed importance of completing yearly diabetic eye exam - patient recently completed eye exam with Dr. Lucianne Lei at [facility] Introduced role of care coordination team; patient declines further engagement at this time Instructed the patient to follow up with her primary care team as needed         Please call the care guide team at [phone] if you need to schedule an appointment with our care coordination team.  If you are experiencing a Mental Health or [place] or need someone to talk to, please call [phone]-TALK (toll free, 24 hour hotline)  Patient verbalizes understanding of instructions and care plan provided today and agrees to view in [place]. Active MyChart status and patient understanding of how to access instructions and care plan via MyChart confirmed with patient.     No further follow up required: Please follow up with your primary care provider as needed.  Daneen Schick, BSW, CDP Social Worker, Certified Dementia Practitioner Care Coordination [phone]

## 2022-05-01 NOTE — Patient Outreach (Signed)
  Care Coordination   Initial Visit Note   05/01/2022 Name: Kristine Gray MRN: [id number] DOB: 1950/10/08  Kristine Gray is a 71 y.o. year old female who sees Bernerd Limbo, MD for primary care. I spoke with  Kristine Gray by phone today  What matters to the patients health and wellness today?  No concerns, doing well    Goals Addressed             This Visit's Progress    COMPLETED: Care Coordination Activities - no follow up needed       Care Coordination Interventions: SDoH screening completed - no acute challenges identified Fall risk assessment performed- patient is a moderate risk due to one fall within the last year which results in a hospitalization. Patient had a syncopal episode and hit her head. During inpatient stay patient medications were adjusted and she completed outpatient PT, no concerns at this time re: fall management Discussed patient does not have difficulty affording and/or adhering to medications Reviewed importance of completing yearly diabetic eye exam - patient recently completed eye exam with [facility]. [facility] Le[facility] at [facility] Introduced role of care coordination team; patient declines further engagement at this time Instructed the patient to follow up with her primary care team as needed        SDOH assessments and interventions completed:  Yes  SDOH Interventions Today    Flowsheet Row Most Recent Value  SDOH Interventions   Food Insecurity Interventions Intervention Not Indicated  Housing Interventions Intervention Not Indicated  Transportation Interventions Intervention Not Indicated        Care Coordination Interventions Activated:  Yes  Care Coordination Interventions:  Yes, provided   Follow up plan: No further intervention required.   Encounter Outcome:  Pt. Visit Completed   Daneen Schick, BSW, CDP Social Worker, Certified Dementia Practitioner Care Coordination [phone]

## 2022-05-08 ENCOUNTER — Other Ambulatory Visit (HOSPITAL_BASED_OUTPATIENT_CLINIC_OR_DEPARTMENT_OTHER): Payer: Self-pay

## 2022-05-09 ENCOUNTER — Other Ambulatory Visit (HOSPITAL_BASED_OUTPATIENT_CLINIC_OR_DEPARTMENT_OTHER): Payer: Self-pay

## 2022-05-15 DIAGNOSIS — I5189 Other ill-defined heart diseases: Secondary | ICD-10-CM | POA: Diagnosis not present

## 2022-05-15 DIAGNOSIS — I129 Hypertensive chronic kidney disease with stage 1 through stage 4 chronic kidney disease, or unspecified chronic kidney disease: Secondary | ICD-10-CM | POA: Diagnosis not present

## 2022-05-15 DIAGNOSIS — D649 Anemia, unspecified: Secondary | ICD-10-CM | POA: Diagnosis not present

## 2022-05-15 DIAGNOSIS — I700 Atherosclerosis of aorta: Secondary | ICD-10-CM | POA: Diagnosis not present

## 2022-05-15 DIAGNOSIS — E1129 Type 2 diabetes mellitus with other diabetic kidney complication: Secondary | ICD-10-CM | POA: Diagnosis not present

## 2022-05-15 DIAGNOSIS — Z794 Long term (current) use of insulin: Secondary | ICD-10-CM | POA: Diagnosis not present

## 2022-05-15 DIAGNOSIS — E785 Hyperlipidemia, unspecified: Secondary | ICD-10-CM | POA: Diagnosis not present

## 2022-05-15 DIAGNOSIS — G459 Transient cerebral ischemic attack, unspecified: Secondary | ICD-10-CM | POA: Diagnosis not present

## 2022-05-15 DIAGNOSIS — I6523 Occlusion and stenosis of bilateral carotid arteries: Secondary | ICD-10-CM | POA: Diagnosis not present

## 2022-05-15 DIAGNOSIS — N1832 Chronic kidney disease, stage 3b: Secondary | ICD-10-CM | POA: Diagnosis not present

## 2022-07-25 ENCOUNTER — Other Ambulatory Visit: Payer: Self-pay | Admitting: Family Medicine

## 2022-07-25 DIAGNOSIS — Z1231 Encounter for screening mammogram for malignant neoplasm of breast: Secondary | ICD-10-CM

## 2022-08-08 ENCOUNTER — Other Ambulatory Visit (HOSPITAL_BASED_OUTPATIENT_CLINIC_OR_DEPARTMENT_OTHER): Payer: Self-pay

## 2022-08-08 ENCOUNTER — Other Ambulatory Visit: Payer: Self-pay

## 2022-08-09 ENCOUNTER — Other Ambulatory Visit (HOSPITAL_BASED_OUTPATIENT_CLINIC_OR_DEPARTMENT_OTHER): Payer: Self-pay

## 2022-08-09 MED ORDER — GABAPENTIN 300 MG PO CAPS
300.0000 mg | ORAL_CAPSULE | Freq: Every day | ORAL | 3 refills | Status: AC
  Filled 2022-08-09: qty 90, 90d supply, fill #0
  Filled 2022-11-13: qty 90, 90d supply, fill #1
  Filled 2023-02-19: qty 90, 90d supply, fill #2

## 2022-08-09 MED ORDER — CYCLOBENZAPRINE HCL 10 MG PO TABS
10.0000 mg | ORAL_TABLET | Freq: Every day | ORAL | 3 refills | Status: DC
  Filled 2022-08-09: qty 90, 90d supply, fill #0
  Filled 2022-11-13: qty 90, 90d supply, fill #1
  Filled 2023-02-22: qty 90, 90d supply, fill #2
  Filled 2023-05-24: qty 90, 90d supply, fill #3

## 2022-08-09 MED ORDER — ALLOPURINOL 100 MG PO TABS
100.0000 mg | ORAL_TABLET | Freq: Every day | ORAL | 4 refills | Status: DC
  Filled 2022-08-09: qty 90, 90d supply, fill #0
  Filled 2022-11-13: qty 90, 90d supply, fill #1
  Filled 2023-02-19: qty 90, 90d supply, fill #2
  Filled 2023-05-24: qty 90, 90d supply, fill #3

## 2022-08-09 MED ORDER — NEBIVOLOL HCL 10 MG PO TABS
10.0000 mg | ORAL_TABLET | Freq: Two times a day (BID) | ORAL | 3 refills | Status: AC
  Filled 2022-08-09: qty 180, 90d supply, fill #0
  Filled 2022-11-13: qty 180, 90d supply, fill #1
  Filled 2023-05-24 – 2023-06-09 (×2): qty 180, 90d supply, fill #2

## 2022-09-01 ENCOUNTER — Other Ambulatory Visit (HOSPITAL_BASED_OUTPATIENT_CLINIC_OR_DEPARTMENT_OTHER): Payer: Self-pay

## 2022-09-13 DIAGNOSIS — I152 Hypertension secondary to endocrine disorders: Secondary | ICD-10-CM | POA: Diagnosis not present

## 2022-09-13 DIAGNOSIS — N183 Chronic kidney disease, stage 3 unspecified: Secondary | ICD-10-CM | POA: Diagnosis not present

## 2022-09-13 DIAGNOSIS — E1169 Type 2 diabetes mellitus with other specified complication: Secondary | ICD-10-CM | POA: Diagnosis not present

## 2022-09-13 DIAGNOSIS — Z794 Long term (current) use of insulin: Secondary | ICD-10-CM | POA: Diagnosis not present

## 2022-09-13 DIAGNOSIS — E1159 Type 2 diabetes mellitus with other circulatory complications: Secondary | ICD-10-CM | POA: Diagnosis not present

## 2022-09-13 DIAGNOSIS — E1122 Type 2 diabetes mellitus with diabetic chronic kidney disease: Secondary | ICD-10-CM | POA: Diagnosis not present

## 2022-09-13 DIAGNOSIS — E785 Hyperlipidemia, unspecified: Secondary | ICD-10-CM | POA: Diagnosis not present

## 2022-09-13 DIAGNOSIS — Z0000 Encounter for general adult medical examination without abnormal findings: Secondary | ICD-10-CM | POA: Diagnosis not present

## 2022-09-19 ENCOUNTER — Other Ambulatory Visit (HOSPITAL_BASED_OUTPATIENT_CLINIC_OR_DEPARTMENT_OTHER): Payer: Self-pay

## 2022-09-19 MED ORDER — COLCHICINE 0.6 MG PO TABS
0.6000 mg | ORAL_TABLET | Freq: Every day | ORAL | 1 refills | Status: AC | PRN
  Filled 2022-09-19: qty 30, 30d supply, fill #0

## 2022-09-20 ENCOUNTER — Other Ambulatory Visit (HOSPITAL_BASED_OUTPATIENT_CLINIC_OR_DEPARTMENT_OTHER): Payer: Self-pay

## 2022-09-21 ENCOUNTER — Ambulatory Visit
Admission: RE | Admit: 2022-09-21 | Discharge: 2022-09-21 | Disposition: A | Discharge status: Home / Self Care | Payer: PPO | Source: Ambulatory Visit | Attending: Family Medicine | Admitting: Family Medicine

## 2022-09-21 DIAGNOSIS — Z1231 Encounter for screening mammogram for malignant neoplasm of breast: Secondary | ICD-10-CM | POA: Diagnosis not present

## 2022-10-04 ENCOUNTER — Other Ambulatory Visit (HOSPITAL_BASED_OUTPATIENT_CLINIC_OR_DEPARTMENT_OTHER): Payer: Self-pay

## 2022-10-18 ENCOUNTER — Other Ambulatory Visit (HOSPITAL_BASED_OUTPATIENT_CLINIC_OR_DEPARTMENT_OTHER): Payer: Self-pay

## 2022-10-18 ENCOUNTER — Other Ambulatory Visit: Payer: Self-pay | Admitting: Pharmacotherapy

## 2022-10-19 ENCOUNTER — Other Ambulatory Visit (HOSPITAL_BASED_OUTPATIENT_CLINIC_OR_DEPARTMENT_OTHER): Payer: Self-pay

## 2022-10-27 DIAGNOSIS — D649 Anemia, unspecified: Secondary | ICD-10-CM | POA: Diagnosis not present

## 2022-10-27 DIAGNOSIS — G459 Transient cerebral ischemic attack, unspecified: Secondary | ICD-10-CM | POA: Diagnosis not present

## 2022-10-27 DIAGNOSIS — I5189 Other ill-defined heart diseases: Secondary | ICD-10-CM | POA: Diagnosis not present

## 2022-10-27 DIAGNOSIS — Z794 Long term (current) use of insulin: Secondary | ICD-10-CM | POA: Diagnosis not present

## 2022-10-27 DIAGNOSIS — E1129 Type 2 diabetes mellitus with other diabetic kidney complication: Secondary | ICD-10-CM | POA: Diagnosis not present

## 2022-10-27 DIAGNOSIS — E785 Hyperlipidemia, unspecified: Secondary | ICD-10-CM | POA: Diagnosis not present

## 2022-10-27 DIAGNOSIS — I700 Atherosclerosis of aorta: Secondary | ICD-10-CM | POA: Diagnosis not present

## 2022-10-27 DIAGNOSIS — M109 Gout, unspecified: Secondary | ICD-10-CM | POA: Diagnosis not present

## 2022-10-27 DIAGNOSIS — I6523 Occlusion and stenosis of bilateral carotid arteries: Secondary | ICD-10-CM | POA: Diagnosis not present

## 2022-10-27 DIAGNOSIS — I129 Hypertensive chronic kidney disease with stage 1 through stage 4 chronic kidney disease, or unspecified chronic kidney disease: Secondary | ICD-10-CM | POA: Diagnosis not present

## 2022-10-27 DIAGNOSIS — N1832 Chronic kidney disease, stage 3b: Secondary | ICD-10-CM | POA: Diagnosis not present

## 2022-11-13 ENCOUNTER — Other Ambulatory Visit: Payer: Self-pay | Admitting: Vascular Surgery

## 2022-11-13 ENCOUNTER — Other Ambulatory Visit (HOSPITAL_BASED_OUTPATIENT_CLINIC_OR_DEPARTMENT_OTHER): Payer: Self-pay

## 2022-11-13 MED ORDER — ATORVASTATIN CALCIUM 80 MG PO TABS
80.0000 mg | ORAL_TABLET | Freq: Every day | ORAL | 4 refills | Status: DC
  Filled 2022-11-13: qty 90, 90d supply, fill #0
  Filled 2023-02-19: qty 90, 90d supply, fill #1
  Filled 2023-05-24: qty 90, 90d supply, fill #2
  Filled 2023-08-20: qty 90, 90d supply, fill #3

## 2022-11-14 ENCOUNTER — Other Ambulatory Visit: Payer: Self-pay

## 2022-11-15 ENCOUNTER — Other Ambulatory Visit: Payer: Self-pay | Admitting: Vascular Surgery

## 2022-11-15 ENCOUNTER — Other Ambulatory Visit (HOSPITAL_BASED_OUTPATIENT_CLINIC_OR_DEPARTMENT_OTHER): Payer: Self-pay

## 2022-11-15 MED ORDER — CLOPIDOGREL BISULFATE 75 MG PO TABS
75.0000 mg | ORAL_TABLET | Freq: Every day | ORAL | 4 refills | Status: DC
  Filled 2022-11-15: qty 90, 90d supply, fill #0
  Filled 2023-02-19: qty 90, 90d supply, fill #1
  Filled 2023-05-24: qty 90, 90d supply, fill #2
  Filled 2023-08-20: qty 90, 90d supply, fill #3

## 2022-12-05 ENCOUNTER — Other Ambulatory Visit (HOSPITAL_BASED_OUTPATIENT_CLINIC_OR_DEPARTMENT_OTHER): Payer: Self-pay

## 2022-12-05 DIAGNOSIS — E1129 Type 2 diabetes mellitus with other diabetic kidney complication: Secondary | ICD-10-CM | POA: Diagnosis not present

## 2022-12-05 DIAGNOSIS — I129 Hypertensive chronic kidney disease with stage 1 through stage 4 chronic kidney disease, or unspecified chronic kidney disease: Secondary | ICD-10-CM | POA: Diagnosis not present

## 2022-12-05 DIAGNOSIS — E785 Hyperlipidemia, unspecified: Secondary | ICD-10-CM | POA: Diagnosis not present

## 2022-12-05 DIAGNOSIS — Z794 Long term (current) use of insulin: Secondary | ICD-10-CM | POA: Diagnosis not present

## 2022-12-05 DIAGNOSIS — N1832 Chronic kidney disease, stage 3b: Secondary | ICD-10-CM | POA: Diagnosis not present

## 2022-12-05 DIAGNOSIS — K219 Gastro-esophageal reflux disease without esophagitis: Secondary | ICD-10-CM | POA: Diagnosis not present

## 2022-12-05 MED ORDER — BAQSIMI TWO PACK 3 MG/DOSE NA POWD
NASAL | 11 refills | Status: AC
  Filled 2022-12-05: qty 2, 10d supply, fill #0
  Filled 2023-04-02 – 2023-06-09 (×3): qty 2, 10d supply, fill #1

## 2022-12-05 MED ORDER — FAMOTIDINE 20 MG PO TABS
20.0000 mg | ORAL_TABLET | Freq: Two times a day (BID) | ORAL | 3 refills | Status: AC
  Filled 2022-12-05: qty 180, 90d supply, fill #0
  Filled 2023-03-11: qty 180, 90d supply, fill #1

## 2022-12-05 MED ORDER — HUMULIN R U-500 KWIKPEN 500 UNIT/ML ~~LOC~~ SOPN
PEN_INJECTOR | SUBCUTANEOUS | 3 refills | Status: DC
  Filled 2022-12-05: qty 54, 90d supply, fill #0
  Filled 2023-10-24: qty 30, 49d supply, fill #1
  Filled 2023-10-24: qty 24, 41d supply, fill #1

## 2022-12-05 MED ORDER — FREESTYLE LIBRE 2 SENSOR MISC
3 refills | Status: AC
  Filled 2022-12-05 – 2022-12-20 (×5): qty 6, 84d supply, fill #0
  Filled 2023-05-24: qty 6, 84d supply, fill #1
  Filled 2023-08-20: qty 6, 84d supply, fill #2

## 2022-12-06 ENCOUNTER — Other Ambulatory Visit (HOSPITAL_BASED_OUTPATIENT_CLINIC_OR_DEPARTMENT_OTHER): Payer: Self-pay

## 2022-12-11 ENCOUNTER — Other Ambulatory Visit (HOSPITAL_BASED_OUTPATIENT_CLINIC_OR_DEPARTMENT_OTHER): Payer: Self-pay

## 2022-12-12 ENCOUNTER — Other Ambulatory Visit (HOSPITAL_BASED_OUTPATIENT_CLINIC_OR_DEPARTMENT_OTHER): Payer: Self-pay

## 2022-12-14 ENCOUNTER — Other Ambulatory Visit: Payer: Self-pay

## 2022-12-19 ENCOUNTER — Other Ambulatory Visit (HOSPITAL_BASED_OUTPATIENT_CLINIC_OR_DEPARTMENT_OTHER): Payer: Self-pay

## 2022-12-20 ENCOUNTER — Other Ambulatory Visit (HOSPITAL_BASED_OUTPATIENT_CLINIC_OR_DEPARTMENT_OTHER): Payer: Self-pay

## 2023-01-04 ENCOUNTER — Other Ambulatory Visit: Payer: Self-pay | Admitting: *Deleted

## 2023-01-04 DIAGNOSIS — I6523 Occlusion and stenosis of bilateral carotid arteries: Secondary | ICD-10-CM

## 2023-01-05 ENCOUNTER — Ambulatory Visit (HOSPITAL_COMMUNITY)
Admission: RE | Admit: 2023-01-05 | Discharge: 2023-01-05 | Disposition: A | Discharge status: Home / Self Care | Payer: PPO | Source: Ambulatory Visit | Attending: Vascular Surgery | Admitting: Vascular Surgery

## 2023-01-05 DIAGNOSIS — I6523 Occlusion and stenosis of bilateral carotid arteries: Secondary | ICD-10-CM | POA: Diagnosis not present

## 2023-01-31 ENCOUNTER — Encounter: Payer: Self-pay | Admitting: Physician Assistant

## 2023-01-31 ENCOUNTER — Ambulatory Visit (INDEPENDENT_AMBULATORY_CARE_PROVIDER_SITE_OTHER): Payer: PPO | Admitting: Physician Assistant

## 2023-01-31 VITALS — BP 142/74 | HR 77 | Temp 97.8°F | Wt 271.0 lb

## 2023-01-31 DIAGNOSIS — I6523 Occlusion and stenosis of bilateral carotid arteries: Secondary | ICD-10-CM

## 2023-01-31 NOTE — Progress Notes (Signed)
Office Note     CC:  follow up Requesting Provider:  Tracey Harries, MD  HPI: Kristine Gray is a 72 y.o. (07-Apr-1951) female who presents for routine follow up of carotid artery stenosis. She has history of left CEA on 07/16/20 by Dr. Arbie Cookey for symptomatic disease. She has had no residual deficits.  She has remained without any symptoms on follow up. She has been maintained on Plavix and statin.  Today she presents with her husband. She denies any amaurosis fugax, no slurred speech, facial drooping, unilateral upper and lower extremity weakness or numbness. She has been having some pain in her right leg over past month. Sharp shooting pains running down the leg or from ankle up to mid thigh. No other pain on ambulation or rest. No wounds. She is taking Plavix and Statin  Past Medical History:  Diagnosis Date   Anemia    Arthritis 04-02-12   arthritis lower back and spurs   Carotid artery occlusion    Diabetes mellitus 04-02-12   oral med and insulin used   Diverticulosis of colon 04-02-12   hx. of, no problems   H/O nephrostomy 04-02-12   03-19-12 tube placed and remains lt. flank.   Hypertension    Kidney calculi 04-02-12   hx. kidney stones-left, past hx. kidney stones   Obesity     Past Surgical History:  Procedure Laterality Date   CATARACT EXTRACTION, BILATERAL  04/2021   CESAREAN SECTION  04/02/2012   '86   CHOLECYSTECTOMY     DILATION AND CURETTAGE OF UTERUS  04/02/2012   ENDARTERECTOMY Left 07/16/2020   Procedure: ENDARTERECTOMY CAROTID;  Surgeon: Larina Earthly, MD;  Location: [facility] OR;  Service: Vascular;  Laterality: Left;   iriodotomy  04/02/2012   bil. eyes   NEPHROLITHOTOMY  04/09/2012   Procedure: NEPHROLITHOTOMY PERCUTANEOUS;  Surgeon: Antony Haste, MD;  Location: [facility] ORS;  Service: Urology;  Laterality: Left;   TONSILLECTOMY     TUBAL LIGATION  04/02/2012   '86    Social History   Socioeconomic History   Marital status: Married    Spouse name: Not  on file   Number of children: Not on file   Years of education: Not on file   Highest education level: Not on file  Occupational History   Not on file  Tobacco Use   Smoking status: Never   Smokeless tobacco: Never  Vaping Use   Vaping Use: Never used  Substance and Sexual Activity   Alcohol use: No   Drug use: No   Sexual activity: Yes  Other Topics Concern   Not on file  Social History Narrative   Not on file   Social Determinants of Health   Financial Resource Strain: Not on file  Food Insecurity: No Food Insecurity (05/01/2022)   Hunger Vital Sign    Worried About Running Out of Food in the Last Year: Never true    Ran Out of Food in the Last Year: Never true  Transportation Needs: No Transportation Needs (05/01/2022)   PRAPARE - Administrator, Civil Service (Medical): No    Lack of Transportation (Non-Medical): No  Physical Activity: Not on file  Stress: Not on file  Social Connections: Not on file  Intimate Partner Violence: Not on file    Family History  Problem Relation Age of Onset   Cancer Other    Hypertension Other    Hyperlipidemia Other    Obesity Other    Heart  attack Other    Breast cancer Maternal Aunt        in mid 60's    Current Outpatient Medications  Medication Sig Dispense Refill   acetaminophen (TYLENOL) 650 MG CR tablet Take 650 mg by mouth every 8 (eight) hours as needed for pain.     allopurinol (ZYLOPRIM) 100 MG tablet Take 1 tablet (100 mg total) by mouth daily. 90 tablet 4   atorvastatin (LIPITOR) 80 MG tablet Take 1 tablet (80 mg total) by mouth daily. 90 tablet 4   Cholecalciferol (NATURAL VITAMIN D-3) 125 MCG (5000 UT) TABS Take 5,000 Units by mouth daily.     clopidogrel (PLAVIX) 75 MG tablet Take 1 tablet (75 mg total) by mouth daily. 90 tablet 4   colchicine 0.6 MG tablet Take 1 tablet by mouth once daily as needed 90 tablet 1   Continuous Blood Gluc Sensor (FREESTYLE LIBRE 2 SENSOR) MISC Change every 14 days to  monitor blood glucose continuously as directed. 6 each 3   Cyanocobalamin (VITAMIN B-12) 5000 MCG TBDP Take 5,000 Units by mouth daily.     cyclobenzaprine (FLEXERIL) 10 MG tablet TAKE 1 TABLET BY MOUTH ONCE DAILY 90 tablet 3   famotidine (PEPCID) 20 MG tablet Take 1 tablet (20 mg total) by mouth 2 (two) times daily. 180 tablet 3   gabapentin (NEURONTIN) 300 MG capsule TAKE 1 CAPSULE BY MOUTH ONCE DAILY 90 capsule 3   Glucagon (BAQSIMI TWO PACK) 3 MG/DOSE POWD Use as directed as needed for severe hypoglycemia. 2 each 11   Insulin Pen Needle 31G X 5 MM MISC Use as directed 3 times a day 300 each 3   insulin regular human CONCENTRATED (HUMULIN R U-500 KWIKPEN) 500 UNIT/ML KwikPen Inject 150 units every morning, 100 units at lunch time and 40 units with dinner. 54 mL 3   magnesium oxide (MAG-OX) 400 (240 Mg) MG tablet Take 1 tablet (400 mg total) by mouth 2 (two) times daily. (Patient taking differently: Take 200 mg by mouth 2 (two) times daily.) 120 tablet 0   nebivolol (BYSTOLIC) 10 MG tablet Take 1 tablet (10 mg total) by mouth 2 (two) times daily. 270 tablet 3   colchicine 0.6 MG tablet Take 1 tablet (0.6 mg total) by mouth daily as needed. 30 tablet 1   predniSONE (DELTASONE) 20 MG tablet Take 1 tablet (20 mg total) by mouth daily before breakfast. 3 tablet 0   vitamin E 180 MG (400 UNITS) capsule Take 400 Units by mouth daily.     No current facility-administered medications for this visit.    Allergies  Allergen Reactions   Penicillins Hives, Itching and Swelling   Clindamycin/Lincomycin     Other reaction(s): Unknown Trouble breathing   Dapagliflozin     Other reaction(s): Dizziness   Valsartan Other (See Comments)    Bradycardia     REVIEW OF SYSTEMS:   [X]  denotes positive finding, [ ]  denotes negative finding Cardiac  Comments:  Chest pain or chest pressure:    Shortness of breath upon exertion:    Short of breath when lying flat:    Irregular heart rhythm:         Vascular    Pain in calf, thigh, or hip brought on by ambulation:    Pain in feet at night that wakes you up from your sleep:     Blood clot in your veins:    Leg swelling:         Pulmonary  Oxygen at home:    Productive cough:     Wheezing:         Neurologic    Sudden weakness in arms or legs:     Sudden numbness in arms or legs:     Sudden onset of difficulty speaking or slurred speech:    Temporary loss of vision in one eye:     Problems with dizziness:         Gastrointestinal    Blood in stool:     Vomited blood:         Genitourinary    Burning when urinating:     Blood in urine:        Psychiatric    Major depression:         Hematologic    Bleeding problems:    Problems with blood clotting too easily:        Skin    Rashes or ulcers:        Constitutional    Fever or chills:      PHYSICAL EXAMINATION:  Vitals:   01/31/23 1310  BP: (!) 142/74  Pulse: 77  Temp: 97.8 F (36.6 C)  TempSrc: Temporal  SpO2: 100%  Weight: 271 lb (122.9 kg)    General:  WDWN in NAD; vital signs documented above Gait: Normal  HENT: WNL, normocephalic Pulmonary: normal non-labored breathing , without wheezing Cardiac: regular HR Abdomen: soft, NT, no masses Vascular Exam/Pulses: 2+ radial, 2+ DP and PT pulses bilaterally Extremities: without ischemic changes, without Gangrene , without cellulitis; without open wounds;  Musculoskeletal: no muscle wasting or atrophy  Neurologic: A&O X 3 Psychiatric:  The pt has Normal affect.   Non-Invasive Vascular Imaging:   VAS US Carotid Duplex: Summary:  Right Carotid: Velocities in the right ICA are consistent with a 1-39% stenosis.   Left Carotid: Patent internal artery endarterectomy site wihtout evidence of restenosis.   Vertebrals:  Bilateral vertebral arteries demonstrate antegrade flow.  Subclavians: Normal flow hemodynamics were seen in bilateral subclavian arteries.    ASSESSMENT/PLAN:: 72 y.o. female here  for follow up for carotid artery stenosis. She has history of left CEA on 07/16/20 by Dr. Arbie Cookey for symptomatic disease. She has had no residual deficits.  She has remained without any symptoms on follow up. She has been maintained on Plavix and statin. - Duplex today shows 1-39% right ICA stenosis, no recurrent stenosis on left ICA, normal flow in the vertebral and subclavian arteries bilaterally - Continue Plavix and Statin - Reviewed signs and symptoms of TIA/ Stroke and she understands should this occur to seek immediate medical attention -She will follow up in 1 year with repeat carotid duplex   Graceann Congress, PA-C [facility] [phone]  Clinic MD:   [facility]/ Randie Heinz

## 2023-02-12 ENCOUNTER — Other Ambulatory Visit: Payer: Self-pay

## 2023-02-12 DIAGNOSIS — I6523 Occlusion and stenosis of bilateral carotid arteries: Secondary | ICD-10-CM

## 2023-02-19 ENCOUNTER — Other Ambulatory Visit (HOSPITAL_BASED_OUTPATIENT_CLINIC_OR_DEPARTMENT_OTHER): Payer: Self-pay

## 2023-02-20 ENCOUNTER — Other Ambulatory Visit: Payer: Self-pay

## 2023-02-20 ENCOUNTER — Other Ambulatory Visit (HOSPITAL_BASED_OUTPATIENT_CLINIC_OR_DEPARTMENT_OTHER): Payer: Self-pay

## 2023-02-20 MED ORDER — BD PEN NEEDLE MINI U/F 31G X 5 MM MISC
4 refills | Status: AC
  Filled 2023-02-20 (×2): qty 300, 90d supply, fill #0
  Filled 2023-02-20: qty 300, 100d supply, fill #0
  Filled 2023-06-09: qty 300, 100d supply, fill #1
  Filled 2023-09-25: qty 300, 100d supply, fill #2
  Filled 2024-01-16: qty 300, 100d supply, fill #3

## 2023-02-22 ENCOUNTER — Other Ambulatory Visit (HOSPITAL_BASED_OUTPATIENT_CLINIC_OR_DEPARTMENT_OTHER): Payer: Self-pay

## 2023-03-12 ENCOUNTER — Other Ambulatory Visit (HOSPITAL_BASED_OUTPATIENT_CLINIC_OR_DEPARTMENT_OTHER): Payer: Self-pay

## 2023-03-12 DIAGNOSIS — E1159 Type 2 diabetes mellitus with other circulatory complications: Secondary | ICD-10-CM | POA: Diagnosis not present

## 2023-03-12 DIAGNOSIS — Z1330 Encounter for screening examination for mental health and behavioral disorders, unspecified: Secondary | ICD-10-CM | POA: Diagnosis not present

## 2023-03-12 DIAGNOSIS — E1169 Type 2 diabetes mellitus with other specified complication: Secondary | ICD-10-CM | POA: Diagnosis not present

## 2023-03-12 DIAGNOSIS — M5441 Lumbago with sciatica, right side: Secondary | ICD-10-CM | POA: Diagnosis not present

## 2023-03-12 DIAGNOSIS — N183 Chronic kidney disease, stage 3 unspecified: Secondary | ICD-10-CM | POA: Diagnosis not present

## 2023-03-12 DIAGNOSIS — I152 Hypertension secondary to endocrine disorders: Secondary | ICD-10-CM | POA: Diagnosis not present

## 2023-03-12 DIAGNOSIS — Z794 Long term (current) use of insulin: Secondary | ICD-10-CM | POA: Diagnosis not present

## 2023-03-12 DIAGNOSIS — E1122 Type 2 diabetes mellitus with diabetic chronic kidney disease: Secondary | ICD-10-CM | POA: Diagnosis not present

## 2023-03-12 DIAGNOSIS — E785 Hyperlipidemia, unspecified: Secondary | ICD-10-CM | POA: Diagnosis not present

## 2023-03-12 MED ORDER — GABAPENTIN 300 MG PO CAPS
300.0000 mg | ORAL_CAPSULE | Freq: Two times a day (BID) | ORAL | 1 refills | Status: DC
  Filled 2023-03-23 – 2023-04-15 (×3): qty 180, 90d supply, fill #0
  Filled 2023-04-18: qty 60, 30d supply, fill #0
  Filled 2023-05-24: qty 60, 30d supply, fill #1
  Filled 2023-06-11 – 2023-06-25 (×2): qty 60, 30d supply, fill #2
  Filled 2023-07-23: qty 60, 30d supply, fill #3
  Filled 2023-08-20: qty 60, 30d supply, fill #4
  Filled 2023-09-23: qty 60, 30d supply, fill #5

## 2023-03-12 MED ORDER — ONDANSETRON 8 MG PO TBDP
8.0000 mg | ORAL_TABLET | Freq: Four times a day (QID) | ORAL | 1 refills | Status: AC | PRN
  Filled 2023-03-12: qty 20, 5d supply, fill #0
  Filled 2023-04-02: qty 20, 5d supply, fill #1
  Filled 2023-04-18: qty 20, 7d supply, fill #1

## 2023-03-12 MED ORDER — TRAMADOL HCL 50 MG PO TABS
50.0000 mg | ORAL_TABLET | Freq: Four times a day (QID) | ORAL | 0 refills | Status: DC | PRN
  Filled 2023-03-12: qty 20, 5d supply, fill #0

## 2023-03-15 ENCOUNTER — Other Ambulatory Visit: Payer: Self-pay

## 2023-03-15 ENCOUNTER — Other Ambulatory Visit (HOSPITAL_BASED_OUTPATIENT_CLINIC_OR_DEPARTMENT_OTHER): Payer: Self-pay

## 2023-03-15 DIAGNOSIS — E049 Nontoxic goiter, unspecified: Secondary | ICD-10-CM | POA: Diagnosis not present

## 2023-03-15 DIAGNOSIS — E785 Hyperlipidemia, unspecified: Secondary | ICD-10-CM | POA: Diagnosis not present

## 2023-03-15 DIAGNOSIS — K219 Gastro-esophageal reflux disease without esophagitis: Secondary | ICD-10-CM | POA: Diagnosis not present

## 2023-03-15 DIAGNOSIS — E1129 Type 2 diabetes mellitus with other diabetic kidney complication: Secondary | ICD-10-CM | POA: Diagnosis not present

## 2023-03-15 DIAGNOSIS — M109 Gout, unspecified: Secondary | ICD-10-CM | POA: Diagnosis not present

## 2023-03-15 DIAGNOSIS — D649 Anemia, unspecified: Secondary | ICD-10-CM | POA: Diagnosis not present

## 2023-03-15 DIAGNOSIS — N1832 Chronic kidney disease, stage 3b: Secondary | ICD-10-CM | POA: Diagnosis not present

## 2023-03-15 DIAGNOSIS — I1310 Hypertensive heart and chronic kidney disease without heart failure, with stage 1 through stage 4 chronic kidney disease, or unspecified chronic kidney disease: Secondary | ICD-10-CM | POA: Diagnosis not present

## 2023-03-15 DIAGNOSIS — Z794 Long term (current) use of insulin: Secondary | ICD-10-CM | POA: Diagnosis not present

## 2023-03-15 DIAGNOSIS — H409 Unspecified glaucoma: Secondary | ICD-10-CM | POA: Diagnosis not present

## 2023-03-15 DIAGNOSIS — I6523 Occlusion and stenosis of bilateral carotid arteries: Secondary | ICD-10-CM | POA: Diagnosis not present

## 2023-03-15 MED ORDER — BAQSIMI TWO PACK 3 MG/DOSE NA POWD
NASAL | 11 refills | Status: AC
  Filled 2023-03-15 (×2): qty 2, 30d supply, fill #0

## 2023-03-15 MED ORDER — HUMULIN R U-500 KWIKPEN 500 UNIT/ML ~~LOC~~ SOPN
PEN_INJECTOR | SUBCUTANEOUS | 3 refills | Status: AC
  Filled 2023-03-15 – 2023-04-15 (×2): qty 54, 90d supply, fill #0
  Filled 2023-07-23: qty 9, 5d supply, fill #1
  Filled 2023-07-23: qty 45, 85d supply, fill #1
  Filled 2023-10-24: qty 54, 90d supply, fill #2

## 2023-03-15 MED ORDER — HYDROCHLOROTHIAZIDE 25 MG PO TABS
25.0000 mg | ORAL_TABLET | Freq: Every morning | ORAL | 3 refills | Status: DC
  Filled 2023-03-15 – 2023-04-29 (×2): qty 90, 90d supply, fill #0
  Filled 2023-08-20 – 2023-10-24 (×2): qty 90, 90d supply, fill #1
  Filled 2024-01-21: qty 90, 90d supply, fill #2

## 2023-03-15 MED ORDER — FAMOTIDINE 20 MG PO TABS
20.0000 mg | ORAL_TABLET | Freq: Two times a day (BID) | ORAL | 3 refills | Status: DC
  Filled 2023-03-15 – 2023-06-09 (×3): qty 180, 90d supply, fill #0
  Filled 2023-08-30: qty 180, 90d supply, fill #1
  Filled 2023-12-11: qty 180, 90d supply, fill #2
  Filled 2024-03-05: qty 180, 90d supply, fill #3

## 2023-03-15 MED ORDER — FREESTYLE LIBRE 2 SENSOR MISC
1.0000 | 3 refills | Status: AC
  Filled 2023-03-15: qty 6, 84d supply, fill #0
  Filled 2023-08-20 – 2023-10-24 (×2): qty 6, 84d supply, fill #1
  Filled 2024-01-16: qty 6, 84d supply, fill #2

## 2023-03-16 ENCOUNTER — Other Ambulatory Visit (HOSPITAL_BASED_OUTPATIENT_CLINIC_OR_DEPARTMENT_OTHER): Payer: Self-pay

## 2023-03-23 ENCOUNTER — Other Ambulatory Visit (HOSPITAL_BASED_OUTPATIENT_CLINIC_OR_DEPARTMENT_OTHER): Payer: Self-pay

## 2023-04-03 ENCOUNTER — Other Ambulatory Visit (HOSPITAL_BASED_OUTPATIENT_CLINIC_OR_DEPARTMENT_OTHER): Payer: Self-pay

## 2023-04-03 DIAGNOSIS — H402231 Chronic angle-closure glaucoma, bilateral, mild stage: Secondary | ICD-10-CM | POA: Diagnosis not present

## 2023-04-03 DIAGNOSIS — H524 Presbyopia: Secondary | ICD-10-CM | POA: Diagnosis not present

## 2023-04-03 DIAGNOSIS — H35031 Hypertensive retinopathy, right eye: Secondary | ICD-10-CM | POA: Diagnosis not present

## 2023-04-03 DIAGNOSIS — E119 Type 2 diabetes mellitus without complications: Secondary | ICD-10-CM | POA: Diagnosis not present

## 2023-04-03 DIAGNOSIS — H04123 Dry eye syndrome of bilateral lacrimal glands: Secondary | ICD-10-CM | POA: Diagnosis not present

## 2023-04-16 ENCOUNTER — Other Ambulatory Visit: Payer: Self-pay

## 2023-04-16 ENCOUNTER — Other Ambulatory Visit (HOSPITAL_BASED_OUTPATIENT_CLINIC_OR_DEPARTMENT_OTHER): Payer: Self-pay

## 2023-04-18 ENCOUNTER — Other Ambulatory Visit (HOSPITAL_BASED_OUTPATIENT_CLINIC_OR_DEPARTMENT_OTHER): Payer: Self-pay

## 2023-04-26 ENCOUNTER — Other Ambulatory Visit (HOSPITAL_BASED_OUTPATIENT_CLINIC_OR_DEPARTMENT_OTHER): Payer: Self-pay

## 2023-04-26 DIAGNOSIS — E1129 Type 2 diabetes mellitus with other diabetic kidney complication: Secondary | ICD-10-CM | POA: Diagnosis not present

## 2023-04-26 DIAGNOSIS — I129 Hypertensive chronic kidney disease with stage 1 through stage 4 chronic kidney disease, or unspecified chronic kidney disease: Secondary | ICD-10-CM | POA: Diagnosis not present

## 2023-04-26 DIAGNOSIS — E785 Hyperlipidemia, unspecified: Secondary | ICD-10-CM | POA: Diagnosis not present

## 2023-04-26 DIAGNOSIS — Z794 Long term (current) use of insulin: Secondary | ICD-10-CM | POA: Diagnosis not present

## 2023-04-26 DIAGNOSIS — N1832 Chronic kidney disease, stage 3b: Secondary | ICD-10-CM | POA: Diagnosis not present

## 2023-04-26 MED ORDER — ONDANSETRON 4 MG PO TBDP
4.0000 mg | ORAL_TABLET | Freq: Every day | ORAL | 1 refills | Status: DC
  Filled 2023-04-26: qty 30, 30d supply, fill #0
  Filled 2023-05-28: qty 30, 30d supply, fill #1

## 2023-04-29 ENCOUNTER — Other Ambulatory Visit (HOSPITAL_BASED_OUTPATIENT_CLINIC_OR_DEPARTMENT_OTHER): Payer: Self-pay

## 2023-04-30 ENCOUNTER — Other Ambulatory Visit (HOSPITAL_BASED_OUTPATIENT_CLINIC_OR_DEPARTMENT_OTHER): Payer: Self-pay

## 2023-05-24 ENCOUNTER — Other Ambulatory Visit (HOSPITAL_BASED_OUTPATIENT_CLINIC_OR_DEPARTMENT_OTHER): Payer: Self-pay

## 2023-05-24 ENCOUNTER — Other Ambulatory Visit: Payer: Self-pay

## 2023-05-25 ENCOUNTER — Other Ambulatory Visit (HOSPITAL_BASED_OUTPATIENT_CLINIC_OR_DEPARTMENT_OTHER): Payer: Self-pay

## 2023-05-25 MED ORDER — TRAMADOL HCL 50 MG PO TABS
50.0000 mg | ORAL_TABLET | Freq: Four times a day (QID) | ORAL | 0 refills | Status: DC | PRN
  Filled 2023-05-25: qty 20, 5d supply, fill #0

## 2023-05-29 ENCOUNTER — Other Ambulatory Visit (HOSPITAL_BASED_OUTPATIENT_CLINIC_OR_DEPARTMENT_OTHER): Payer: Self-pay

## 2023-06-10 ENCOUNTER — Other Ambulatory Visit (HOSPITAL_BASED_OUTPATIENT_CLINIC_OR_DEPARTMENT_OTHER): Payer: Self-pay

## 2023-06-11 ENCOUNTER — Other Ambulatory Visit (HOSPITAL_BASED_OUTPATIENT_CLINIC_OR_DEPARTMENT_OTHER): Payer: Self-pay

## 2023-06-28 DIAGNOSIS — N200 Calculus of kidney: Secondary | ICD-10-CM | POA: Diagnosis not present

## 2023-06-28 DIAGNOSIS — N1831 Chronic kidney disease, stage 3a: Secondary | ICD-10-CM | POA: Diagnosis not present

## 2023-06-28 DIAGNOSIS — M109 Gout, unspecified: Secondary | ICD-10-CM | POA: Diagnosis not present

## 2023-06-28 DIAGNOSIS — I129 Hypertensive chronic kidney disease with stage 1 through stage 4 chronic kidney disease, or unspecified chronic kidney disease: Secondary | ICD-10-CM | POA: Diagnosis not present

## 2023-06-28 DIAGNOSIS — G459 Transient cerebral ischemic attack, unspecified: Secondary | ICD-10-CM | POA: Diagnosis not present

## 2023-06-28 DIAGNOSIS — E1122 Type 2 diabetes mellitus with diabetic chronic kidney disease: Secondary | ICD-10-CM | POA: Diagnosis not present

## 2023-07-17 DIAGNOSIS — K219 Gastro-esophageal reflux disease without esophagitis: Secondary | ICD-10-CM | POA: Diagnosis not present

## 2023-07-17 DIAGNOSIS — I700 Atherosclerosis of aorta: Secondary | ICD-10-CM | POA: Diagnosis not present

## 2023-07-17 DIAGNOSIS — E785 Hyperlipidemia, unspecified: Secondary | ICD-10-CM | POA: Diagnosis not present

## 2023-07-17 DIAGNOSIS — Z794 Long term (current) use of insulin: Secondary | ICD-10-CM | POA: Diagnosis not present

## 2023-07-17 DIAGNOSIS — N1832 Chronic kidney disease, stage 3b: Secondary | ICD-10-CM | POA: Diagnosis not present

## 2023-07-17 DIAGNOSIS — I6523 Occlusion and stenosis of bilateral carotid arteries: Secondary | ICD-10-CM | POA: Diagnosis not present

## 2023-07-17 DIAGNOSIS — E1129 Type 2 diabetes mellitus with other diabetic kidney complication: Secondary | ICD-10-CM | POA: Diagnosis not present

## 2023-07-17 DIAGNOSIS — I2510 Atherosclerotic heart disease of native coronary artery without angina pectoris: Secondary | ICD-10-CM | POA: Diagnosis not present

## 2023-07-17 DIAGNOSIS — I129 Hypertensive chronic kidney disease with stage 1 through stage 4 chronic kidney disease, or unspecified chronic kidney disease: Secondary | ICD-10-CM | POA: Diagnosis not present

## 2023-07-23 ENCOUNTER — Other Ambulatory Visit (HOSPITAL_BASED_OUTPATIENT_CLINIC_OR_DEPARTMENT_OTHER): Payer: Self-pay

## 2023-07-23 ENCOUNTER — Other Ambulatory Visit: Payer: Self-pay

## 2023-07-23 MED ORDER — ONDANSETRON 4 MG PO TBDP
4.0000 mg | ORAL_TABLET | Freq: Every day | ORAL | 12 refills | Status: DC
  Filled 2023-07-23: qty 30, 30d supply, fill #0
  Filled 2023-08-20: qty 30, 30d supply, fill #1
  Filled 2023-09-23: qty 30, 30d supply, fill #2
  Filled 2023-10-24: qty 30, 30d supply, fill #3
  Filled 2023-11-18: qty 30, 30d supply, fill #4
  Filled 2023-12-12: qty 30, 30d supply, fill #5
  Filled 2024-01-21: qty 30, 30d supply, fill #6
  Filled 2024-02-21: qty 30, 30d supply, fill #7
  Filled 2024-03-18 – 2024-04-21 (×3): qty 30, 30d supply, fill #8
  Filled 2024-05-22: qty 30, 30d supply, fill #9

## 2023-07-23 MED ORDER — TRAMADOL HCL 50 MG PO TABS
50.0000 mg | ORAL_TABLET | Freq: Four times a day (QID) | ORAL | 0 refills | Status: DC
  Filled 2023-07-23: qty 20, 5d supply, fill #0

## 2023-07-25 ENCOUNTER — Other Ambulatory Visit (HOSPITAL_BASED_OUTPATIENT_CLINIC_OR_DEPARTMENT_OTHER): Payer: Self-pay

## 2023-08-07 DIAGNOSIS — Z13228 Encounter for screening for other metabolic disorders: Secondary | ICD-10-CM | POA: Diagnosis not present

## 2023-08-07 DIAGNOSIS — Z131 Encounter for screening for diabetes mellitus: Secondary | ICD-10-CM | POA: Diagnosis not present

## 2023-08-21 ENCOUNTER — Other Ambulatory Visit (HOSPITAL_BASED_OUTPATIENT_CLINIC_OR_DEPARTMENT_OTHER): Payer: Self-pay

## 2023-08-22 ENCOUNTER — Other Ambulatory Visit (HOSPITAL_BASED_OUTPATIENT_CLINIC_OR_DEPARTMENT_OTHER): Payer: Self-pay

## 2023-08-22 MED ORDER — ALLOPURINOL 100 MG PO TABS
100.0000 mg | ORAL_TABLET | Freq: Every day | ORAL | 4 refills | Status: DC
  Filled 2023-08-22: qty 90, 90d supply, fill #0
  Filled 2023-11-18: qty 90, 90d supply, fill #1
  Filled 2024-02-21: qty 90, 90d supply, fill #2
  Filled 2024-05-22: qty 90, 90d supply, fill #3

## 2023-08-22 MED ORDER — TRAMADOL HCL 50 MG PO TABS
50.0000 mg | ORAL_TABLET | Freq: Four times a day (QID) | ORAL | 0 refills | Status: DC
  Filled 2023-08-22: qty 20, 5d supply, fill #0

## 2023-08-29 ENCOUNTER — Other Ambulatory Visit: Payer: Self-pay | Admitting: Family Medicine

## 2023-08-29 DIAGNOSIS — Z1231 Encounter for screening mammogram for malignant neoplasm of breast: Secondary | ICD-10-CM

## 2023-08-30 DIAGNOSIS — I129 Hypertensive chronic kidney disease with stage 1 through stage 4 chronic kidney disease, or unspecified chronic kidney disease: Secondary | ICD-10-CM | POA: Diagnosis not present

## 2023-08-30 DIAGNOSIS — I2510 Atherosclerotic heart disease of native coronary artery without angina pectoris: Secondary | ICD-10-CM | POA: Diagnosis not present

## 2023-08-30 DIAGNOSIS — Z794 Long term (current) use of insulin: Secondary | ICD-10-CM | POA: Diagnosis not present

## 2023-08-30 DIAGNOSIS — N1832 Chronic kidney disease, stage 3b: Secondary | ICD-10-CM | POA: Diagnosis not present

## 2023-08-30 DIAGNOSIS — E1129 Type 2 diabetes mellitus with other diabetic kidney complication: Secondary | ICD-10-CM | POA: Diagnosis not present

## 2023-09-17 DIAGNOSIS — E785 Hyperlipidemia, unspecified: Secondary | ICD-10-CM | POA: Diagnosis not present

## 2023-09-17 DIAGNOSIS — Z794 Long term (current) use of insulin: Secondary | ICD-10-CM | POA: Diagnosis not present

## 2023-09-17 DIAGNOSIS — E1122 Type 2 diabetes mellitus with diabetic chronic kidney disease: Secondary | ICD-10-CM | POA: Diagnosis not present

## 2023-09-17 DIAGNOSIS — E1159 Type 2 diabetes mellitus with other circulatory complications: Secondary | ICD-10-CM | POA: Diagnosis not present

## 2023-09-17 DIAGNOSIS — I152 Hypertension secondary to endocrine disorders: Secondary | ICD-10-CM | POA: Diagnosis not present

## 2023-09-17 DIAGNOSIS — E1169 Type 2 diabetes mellitus with other specified complication: Secondary | ICD-10-CM | POA: Diagnosis not present

## 2023-09-17 DIAGNOSIS — Z0000 Encounter for general adult medical examination without abnormal findings: Secondary | ICD-10-CM | POA: Diagnosis not present

## 2023-09-17 DIAGNOSIS — N183 Chronic kidney disease, stage 3 unspecified: Secondary | ICD-10-CM | POA: Diagnosis not present

## 2023-09-17 DIAGNOSIS — E1165 Type 2 diabetes mellitus with hyperglycemia: Secondary | ICD-10-CM | POA: Diagnosis not present

## 2023-09-24 ENCOUNTER — Other Ambulatory Visit (HOSPITAL_BASED_OUTPATIENT_CLINIC_OR_DEPARTMENT_OTHER): Payer: Self-pay

## 2023-09-24 DIAGNOSIS — N1831 Chronic kidney disease, stage 3a: Secondary | ICD-10-CM | POA: Diagnosis not present

## 2023-09-25 ENCOUNTER — Other Ambulatory Visit (HOSPITAL_BASED_OUTPATIENT_CLINIC_OR_DEPARTMENT_OTHER): Payer: Self-pay

## 2023-09-25 ENCOUNTER — Other Ambulatory Visit: Payer: Self-pay

## 2023-09-27 ENCOUNTER — Other Ambulatory Visit: Payer: Self-pay

## 2023-09-27 ENCOUNTER — Ambulatory Visit
Admission: RE | Admit: 2023-09-27 | Discharge: 2023-09-27 | Disposition: A | Discharge status: Home / Self Care | Payer: PPO | Source: Ambulatory Visit | Attending: Family Medicine | Admitting: Family Medicine

## 2023-09-27 ENCOUNTER — Other Ambulatory Visit (HOSPITAL_BASED_OUTPATIENT_CLINIC_OR_DEPARTMENT_OTHER): Payer: Self-pay

## 2023-09-27 DIAGNOSIS — Z1231 Encounter for screening mammogram for malignant neoplasm of breast: Secondary | ICD-10-CM | POA: Diagnosis not present

## 2023-09-27 MED ORDER — TRAMADOL HCL 50 MG PO TABS
50.0000 mg | ORAL_TABLET | Freq: Four times a day (QID) | ORAL | 0 refills | Status: DC | PRN
  Filled 2023-09-27: qty 20, 5d supply, fill #0

## 2023-10-01 DIAGNOSIS — N200 Calculus of kidney: Secondary | ICD-10-CM | POA: Diagnosis not present

## 2023-10-01 DIAGNOSIS — I129 Hypertensive chronic kidney disease with stage 1 through stage 4 chronic kidney disease, or unspecified chronic kidney disease: Secondary | ICD-10-CM | POA: Diagnosis not present

## 2023-10-01 DIAGNOSIS — N1831 Chronic kidney disease, stage 3a: Secondary | ICD-10-CM | POA: Diagnosis not present

## 2023-10-01 DIAGNOSIS — E1122 Type 2 diabetes mellitus with diabetic chronic kidney disease: Secondary | ICD-10-CM | POA: Diagnosis not present

## 2023-10-01 DIAGNOSIS — E785 Hyperlipidemia, unspecified: Secondary | ICD-10-CM | POA: Diagnosis not present

## 2023-10-24 ENCOUNTER — Other Ambulatory Visit: Payer: Self-pay

## 2023-10-24 ENCOUNTER — Other Ambulatory Visit (HOSPITAL_BASED_OUTPATIENT_CLINIC_OR_DEPARTMENT_OTHER): Payer: Self-pay

## 2023-10-25 ENCOUNTER — Other Ambulatory Visit (HOSPITAL_BASED_OUTPATIENT_CLINIC_OR_DEPARTMENT_OTHER): Payer: Self-pay

## 2023-10-25 ENCOUNTER — Other Ambulatory Visit: Payer: Self-pay

## 2023-10-25 MED ORDER — GABAPENTIN 300 MG PO CAPS
300.0000 mg | ORAL_CAPSULE | Freq: Two times a day (BID) | ORAL | 1 refills | Status: DC
  Filled 2023-10-25: qty 180, 90d supply, fill #0
  Filled 2023-12-12 – 2024-01-21 (×2): qty 180, 90d supply, fill #1

## 2023-10-26 ENCOUNTER — Other Ambulatory Visit (HOSPITAL_BASED_OUTPATIENT_CLINIC_OR_DEPARTMENT_OTHER): Payer: Self-pay

## 2023-11-12 DIAGNOSIS — I6523 Occlusion and stenosis of bilateral carotid arteries: Secondary | ICD-10-CM | POA: Diagnosis not present

## 2023-11-12 DIAGNOSIS — E1129 Type 2 diabetes mellitus with other diabetic kidney complication: Secondary | ICD-10-CM | POA: Diagnosis not present

## 2023-11-12 DIAGNOSIS — N1832 Chronic kidney disease, stage 3b: Secondary | ICD-10-CM | POA: Diagnosis not present

## 2023-11-12 DIAGNOSIS — H409 Unspecified glaucoma: Secondary | ICD-10-CM | POA: Diagnosis not present

## 2023-11-12 DIAGNOSIS — I1310 Hypertensive heart and chronic kidney disease without heart failure, with stage 1 through stage 4 chronic kidney disease, or unspecified chronic kidney disease: Secondary | ICD-10-CM | POA: Diagnosis not present

## 2023-11-12 DIAGNOSIS — I700 Atherosclerosis of aorta: Secondary | ICD-10-CM | POA: Diagnosis not present

## 2023-11-12 DIAGNOSIS — I2510 Atherosclerotic heart disease of native coronary artery without angina pectoris: Secondary | ICD-10-CM | POA: Diagnosis not present

## 2023-11-12 DIAGNOSIS — K219 Gastro-esophageal reflux disease without esophagitis: Secondary | ICD-10-CM | POA: Diagnosis not present

## 2023-11-12 DIAGNOSIS — E049 Nontoxic goiter, unspecified: Secondary | ICD-10-CM | POA: Diagnosis not present

## 2023-11-12 DIAGNOSIS — D649 Anemia, unspecified: Secondary | ICD-10-CM | POA: Diagnosis not present

## 2023-11-12 DIAGNOSIS — Z794 Long term (current) use of insulin: Secondary | ICD-10-CM | POA: Diagnosis not present

## 2023-11-18 ENCOUNTER — Other Ambulatory Visit: Payer: Self-pay | Admitting: Vascular Surgery

## 2023-11-18 ENCOUNTER — Other Ambulatory Visit (HOSPITAL_BASED_OUTPATIENT_CLINIC_OR_DEPARTMENT_OTHER): Payer: Self-pay

## 2023-11-19 ENCOUNTER — Other Ambulatory Visit: Payer: Self-pay

## 2023-11-19 ENCOUNTER — Other Ambulatory Visit (HOSPITAL_BASED_OUTPATIENT_CLINIC_OR_DEPARTMENT_OTHER): Payer: Self-pay

## 2023-11-19 MED ORDER — CYCLOBENZAPRINE HCL 10 MG PO TABS
10.0000 mg | ORAL_TABLET | Freq: Every day | ORAL | 3 refills | Status: AC
  Filled 2023-11-19: qty 90, 90d supply, fill #0
  Filled 2024-02-21: qty 90, 90d supply, fill #1
  Filled 2024-05-22: qty 90, 90d supply, fill #2
  Filled 2024-08-26: qty 90, 90d supply, fill #3

## 2023-11-20 ENCOUNTER — Other Ambulatory Visit: Payer: Self-pay

## 2023-11-20 ENCOUNTER — Other Ambulatory Visit (HOSPITAL_BASED_OUTPATIENT_CLINIC_OR_DEPARTMENT_OTHER): Payer: Self-pay

## 2023-11-20 MED ORDER — CLOPIDOGREL BISULFATE 75 MG PO TABS
75.0000 mg | ORAL_TABLET | Freq: Every day | ORAL | 4 refills | Status: AC
  Filled 2023-11-20: qty 90, 90d supply, fill #0
  Filled 2024-02-21: qty 90, 90d supply, fill #1
  Filled 2024-05-22: qty 90, 90d supply, fill #2
  Filled 2024-08-26: qty 90, 90d supply, fill #3

## 2023-11-20 MED ORDER — ATORVASTATIN CALCIUM 80 MG PO TABS
80.0000 mg | ORAL_TABLET | Freq: Every day | ORAL | 4 refills | Status: AC
  Filled 2023-11-20: qty 90, 90d supply, fill #0
  Filled 2024-02-21: qty 90, 90d supply, fill #1
  Filled 2024-05-22: qty 90, 90d supply, fill #2
  Filled 2024-08-26: qty 90, 90d supply, fill #3

## 2023-12-12 ENCOUNTER — Other Ambulatory Visit (HOSPITAL_BASED_OUTPATIENT_CLINIC_OR_DEPARTMENT_OTHER): Payer: Self-pay

## 2024-01-15 DIAGNOSIS — I2510 Atherosclerotic heart disease of native coronary artery without angina pectoris: Secondary | ICD-10-CM | POA: Diagnosis not present

## 2024-01-15 DIAGNOSIS — Z794 Long term (current) use of insulin: Secondary | ICD-10-CM | POA: Diagnosis not present

## 2024-01-15 DIAGNOSIS — E1129 Type 2 diabetes mellitus with other diabetic kidney complication: Secondary | ICD-10-CM | POA: Diagnosis not present

## 2024-01-15 DIAGNOSIS — N1832 Chronic kidney disease, stage 3b: Secondary | ICD-10-CM | POA: Diagnosis not present

## 2024-01-15 DIAGNOSIS — I129 Hypertensive chronic kidney disease with stage 1 through stage 4 chronic kidney disease, or unspecified chronic kidney disease: Secondary | ICD-10-CM | POA: Diagnosis not present

## 2024-01-16 ENCOUNTER — Other Ambulatory Visit (HOSPITAL_BASED_OUTPATIENT_CLINIC_OR_DEPARTMENT_OTHER): Payer: Self-pay

## 2024-01-21 ENCOUNTER — Other Ambulatory Visit (HOSPITAL_BASED_OUTPATIENT_CLINIC_OR_DEPARTMENT_OTHER): Payer: Self-pay

## 2024-01-21 ENCOUNTER — Other Ambulatory Visit (HOSPITAL_COMMUNITY): Payer: Self-pay

## 2024-01-21 MED ORDER — HUMULIN R U-500 KWIKPEN 500 UNIT/ML ~~LOC~~ SOPN
PEN_INJECTOR | SUBCUTANEOUS | 3 refills | Status: AC
  Filled 2024-01-21 (×2): qty 54, 93d supply, fill #0

## 2024-01-22 ENCOUNTER — Other Ambulatory Visit (HOSPITAL_BASED_OUTPATIENT_CLINIC_OR_DEPARTMENT_OTHER): Payer: Self-pay

## 2024-02-16 ENCOUNTER — Emergency Department (HOSPITAL_COMMUNITY)

## 2024-02-16 ENCOUNTER — Emergency Department (HOSPITAL_COMMUNITY)
Admission: EM | Admit: 2024-02-16 | Discharge: 2024-02-16 | Disposition: A | Discharge status: Home / Self Care | Attending: Student | Admitting: Student

## 2024-02-16 ENCOUNTER — Other Ambulatory Visit: Payer: Self-pay

## 2024-02-16 ENCOUNTER — Encounter (HOSPITAL_COMMUNITY): Payer: Self-pay | Admitting: Emergency Medicine

## 2024-02-16 DIAGNOSIS — Z79899 Other long term (current) drug therapy: Secondary | ICD-10-CM | POA: Insufficient documentation

## 2024-02-16 DIAGNOSIS — Z043 Encounter for examination and observation following other accident: Secondary | ICD-10-CM | POA: Diagnosis not present

## 2024-02-16 DIAGNOSIS — S199XXA Unspecified injury of neck, initial encounter: Secondary | ICD-10-CM | POA: Diagnosis not present

## 2024-02-16 DIAGNOSIS — M542 Cervicalgia: Secondary | ICD-10-CM | POA: Insufficient documentation

## 2024-02-16 DIAGNOSIS — W1809XA Striking against other object with subsequent fall, initial encounter: Secondary | ICD-10-CM | POA: Insufficient documentation

## 2024-02-16 DIAGNOSIS — I10 Essential (primary) hypertension: Secondary | ICD-10-CM | POA: Diagnosis not present

## 2024-02-16 DIAGNOSIS — G8911 Acute pain due to trauma: Secondary | ICD-10-CM | POA: Diagnosis not present

## 2024-02-16 DIAGNOSIS — S0990XA Unspecified injury of head, initial encounter: Secondary | ICD-10-CM | POA: Diagnosis not present

## 2024-02-16 DIAGNOSIS — M1712 Unilateral primary osteoarthritis, left knee: Secondary | ICD-10-CM | POA: Diagnosis not present

## 2024-02-16 DIAGNOSIS — W19XXXA Unspecified fall, initial encounter: Secondary | ICD-10-CM

## 2024-02-16 DIAGNOSIS — Z794 Long term (current) use of insulin: Secondary | ICD-10-CM | POA: Diagnosis not present

## 2024-02-16 DIAGNOSIS — I672 Cerebral atherosclerosis: Secondary | ICD-10-CM | POA: Diagnosis not present

## 2024-02-16 DIAGNOSIS — R0989 Other specified symptoms and signs involving the circulatory and respiratory systems: Secondary | ICD-10-CM | POA: Diagnosis not present

## 2024-02-16 DIAGNOSIS — E119 Type 2 diabetes mellitus without complications: Secondary | ICD-10-CM | POA: Insufficient documentation

## 2024-02-16 LAB — CBC WITH DIFFERENTIAL/PLATELET
Abs Immature Granulocytes: 0.04 10*3/uL (ref 0.00–0.07)
Basophils Absolute: 0.1 10*3/uL (ref 0.0–0.1)
Basophils Relative: 1 %
Eosinophils Absolute: 0.2 10*3/uL (ref 0.0–0.5)
Eosinophils Relative: 2 %
HCT: 42.2 % (ref 36.0–46.0)
Hemoglobin: 14.1 g/dL (ref 12.0–15.0)
Immature Granulocytes: 1 %
Lymphocytes Relative: 30 %
Lymphs Abs: 2.6 10*3/uL (ref 0.7–4.0)
MCH: 29.7 pg (ref 26.0–34.0)
MCHC: 33.4 g/dL (ref 30.0–36.0)
MCV: 89 fL (ref 80.0–100.0)
Monocytes Absolute: 1.1 10*3/uL — ABNORMAL HIGH (ref 0.1–1.0)
Monocytes Relative: 12 %
Neutro Abs: 4.7 10*3/uL (ref 1.7–7.7)
Neutrophils Relative %: 54 %
Platelets: 238 10*3/uL (ref 150–400)
RBC: 4.74 MIL/uL (ref 3.87–5.11)
RDW: 14.3 % (ref 11.5–15.5)
WBC: 8.6 10*3/uL (ref 4.0–10.5)
nRBC: 0 % (ref 0.0–0.2)

## 2024-02-16 LAB — I-STAT CHEM 8, ED
BUN: 20 mg/dL (ref 8–23)
Calcium, Ion: 1.19 mmol/L (ref 1.15–1.40)
Chloride: 94 mmol/L — ABNORMAL LOW (ref 98–111)
Creatinine, Ser: 1.3 mg/dL — ABNORMAL HIGH (ref 0.44–1.00)
Glucose, Bld: 145 mg/dL — ABNORMAL HIGH (ref 70–99)
HCT: 46 % (ref 36.0–46.0)
Hemoglobin: 15.6 g/dL — ABNORMAL HIGH (ref 12.0–15.0)
Potassium: 4.6 mmol/L (ref 3.5–5.1)
Sodium: 132 mmol/L — ABNORMAL LOW (ref 135–145)
TCO2: 30 mmol/L (ref 22–32)

## 2024-02-16 LAB — COMPREHENSIVE METABOLIC PANEL WITH GFR
ALT: 28 U/L (ref 0–44)
AST: 28 U/L (ref 15–41)
Albumin: 3.9 g/dL (ref 3.5–5.0)
Alkaline Phosphatase: 86 U/L (ref 38–126)
Anion gap: 12 (ref 5–15)
BUN: 16 mg/dL (ref 8–23)
CO2: 25 mmol/L (ref 22–32)
Calcium: 9.8 mg/dL (ref 8.9–10.3)
Chloride: 94 mmol/L — ABNORMAL LOW (ref 98–111)
Creatinine, Ser: 1.3 mg/dL — ABNORMAL HIGH (ref 0.44–1.00)
GFR, Estimated: 43 mL/min — ABNORMAL LOW (ref >60)
Glucose, Bld: 148 mg/dL — ABNORMAL HIGH (ref 70–99)
Potassium: 4.5 mmol/L (ref 3.5–5.1)
Sodium: 131 mmol/L — ABNORMAL LOW (ref 135–145)
Total Bilirubin: 0.9 mg/dL (ref 0.0–1.2)
Total Protein: 7.2 g/dL (ref 6.5–8.1)

## 2024-02-16 LAB — CBG MONITORING, ED: Glucose-Capillary: 114 mg/dL — ABNORMAL HIGH (ref 70–99)

## 2024-02-16 NOTE — ED Notes (Signed)
 Provider bedside.

## 2024-02-16 NOTE — ED Notes (Signed)
2 RNs attempted to start IV, unsuccessful.

## 2024-02-16 NOTE — ED Notes (Signed)
 Pt and RN called spouse to come and pick up pt.  He will arrive in 20 minutes.

## 2024-02-16 NOTE — ED Provider Notes (Signed)
 [place] EMERGENCY DEPARTMENT AT [facility] Provider Note  CSN: [id number] Arrival date & time: 02/16/24 2106  Chief Complaint(s) No chief complaint on file.  HPI Kristine Gray is a 73 y.o. female with PMH anemia, HTN, previous TIA on Plavix  who presents emergency room for evaluation of a fall on thinners.  Patient arrives as a level 2 trauma for head trauma on Plavix .  Patient states that she took a turn too quickly fell and struck her chin on a wooden table.  Fell onto left knee.  Arrives with complaints of lateral neck pain but no associated numbness, tingling, weakness or other neurologic complaints.  Denies chest pain, shortness of breath, abdominal pain, nausea, vomiting or other systemic symptoms.   Past Medical History Past Medical History:  Diagnosis Date   Anemia    Arthritis 04-02-12   arthritis lower back and spurs   Carotid artery occlusion    Diabetes mellitus 04-02-12   oral med and insulin  used   Diverticulosis of colon 04-02-12   hx. of, no problems   H/O nephrostomy 04-02-12   03-19-12 tube placed and remains lt. flank.   Hypertension    Kidney calculi 04-02-12   hx. kidney stones-left, past hx. kidney stones   Obesity    Patient Active Problem List   Diagnosis Date Noted   Hypomagnesemia 11/27/2021   Gout attack 11/27/2021   Near syncope 11/26/2021   UTI (urinary tract infection) 11/26/2021   TIA (transient ischemic attack) 07/14/2020   Pyelonephritis 03/19/2012   DM2 (diabetes mellitus, type 2) (HCC) 03/19/2012   HTN (hypertension) 03/19/2012   Hyperlipidemia 03/19/2012   Renal failure 03/19/2012   Sepsis (HCC) 03/19/2012   Home Medication(s) Prior to Admission medications   Medication Sig Start Date End Date Taking? Authorizing Provider  acetaminophen  (TYLENOL ) 650 MG CR tablet Take 650 mg by mouth every 8 (eight) hours as needed for pain.    [provider]  allopurinol  (ZYLOPRIM ) 100 MG tablet Take 1 tablet (100 mg total) by mouth  daily. 08/22/23     atorvastatin  (LIPITOR ) 80 MG tablet Take 1 tablet (80 mg total) by mouth daily. 11/20/23 11/19/24  Adine Hoof, MD  Cholecalciferol (NATURAL VITAMIN D-3) 125 MCG (5000 UT) TABS Take 5,000 Units by mouth daily.    [provider]  clopidogrel  (PLAVIX ) 75 MG tablet Take 1 tablet (75 mg total) by mouth daily. 11/20/23 11/19/24  Adine Hoof, MD  colchicine  0.6 MG tablet Take 1 tablet by mouth once daily as needed 02/02/22     colchicine  0.6 MG tablet Take 1 tablet (0.6 mg total) by mouth daily as needed. 09/19/22     Continuous Blood Gluc Sensor (FREESTYLE LIBRE 2 SENSOR) MISC Change every 14 days to monitor blood glucose continuously as directed. 12/05/22     Continuous Glucose Sensor (FREESTYLE LIBRE 2 SENSOR) MISC Change every 14 days to monitor blood glucose continuously subcutanesouly as directed 03/15/23   Rosslyn Coons, MD  Cyanocobalamin  (VITAMIN B-12) 5000 MCG TBDP Take 5,000 Units by mouth daily.    [provider]  cyclobenzaprine  (FLEXERIL ) 10 MG tablet Take 1 tablet (10 mg total) by mouth daily. 11/19/23     famotidine  (PEPCID ) 20 MG tablet Take 1 tablet (20 mg total) by mouth 2 (two) times daily. 12/05/22     famotidine  (PEPCID ) 20 MG tablet Take 1 tablet (20 mg total) by mouth 2 (two) times daily. 03/15/23     gabapentin  (NEURONTIN ) 300 MG capsule TAKE 1  CAPSULE BY MOUTH ONCE DAILY 08/09/22     gabapentin  (NEURONTIN ) 300 MG capsule Take 1 capsule (300 mg total) by mouth 2 (two) times daily. 10/25/23     Glucagon  (BAQSIMI  TWO PACK) 3 MG/DOSE POWD Use as directed as needed for severe hypoglycemia. 12/05/22     Glucagon  (BAQSIMI  TWO PACK) 3 MG/DOSE POWD Use as directed Nasally as needed for severe hypoglycemia 03/15/23     hydrochlorothiazide  (HYDRODIURIL ) 25 MG tablet Take 1 tablet (25 mg total) by mouth every morning. 03/15/23     Insulin  Pen Needle (B-D UF III MINI PEN NEEDLES) 31G X 5 MM MISC Use as directed 3 (three) times daily.  02/20/23   Rosslyn Coons, MD  insulin  regular human CONCENTRATED (HUMULIN  R U-500 KWIKPEN) 500 UNIT/ML KwikPen Inject 150 units every morning, inject 100 units at lunch time and inject 40 units with dinner Subcutaneous three times daily 90 days 03/15/23     insulin  regular human CONCENTRATED (HUMULIN  R U-500 KWIKPEN) 500 UNIT/ML KwikPen Inject 150 units every morning, 100 units at lunch time and 40 units with dinner. 01/21/24     magnesium  oxide (MAG-OX) 400 (240 Mg) MG tablet Take 1 tablet (400 mg total) by mouth 2 (two) times daily. Patient taking differently: Take 200 mg by mouth 2 (two) times daily. 11/30/21   Vann, Jessica U, DO  nebivolol  (BYSTOLIC ) 10 MG tablet Take 1 tablet (10 mg total) by mouth 2 (two) times daily. 08/09/22     ondansetron  (ZOFRAN -ODT) 4 MG disintegrating tablet Take 1 tablet (4 mg total) by mouth daily. Place on the tongue and allow to dissolve. 07/23/23     ondansetron  (ZOFRAN -ODT) 8 MG disintegrating tablet Take 1 tablet (8 mg total) by mouth every 6 (six) hours as needed. 03/12/23     predniSONE  (DELTASONE ) 20 MG tablet Take 1 tablet (20 mg total) by mouth daily before breakfast. 12/01/21   Enrigue Harvard, DO  traMADol  (ULTRAM ) 50 MG tablet Take 1 tablet (50 mg total) by mouth every 6 (six) hours as needed for pain for up to 7 days. Max Daily Amount: 200 mg. 09/27/23     vitamin E  180 MG (400 UNITS) capsule Take 400 Units by mouth daily.    [provider]                                                                                                                                    Past Surgical History Past Surgical History:  Procedure Laterality Date   CATARACT EXTRACTION, BILATERAL  04/2021   CESAREAN SECTION  04/02/2012   '86   CHOLECYSTECTOMY     DILATION AND CURETTAGE OF UTERUS  04/02/2012   ENDARTERECTOMY Left 07/16/2020   Procedure: ENDARTERECTOMY CAROTID;  Surgeon: Mayo Speck, MD;  Location: [facility] OR;  Service: Vascular;  Laterality: Left;    iriodotomy  04/02/2012   bil. eyes   NEPHROLITHOTOMY  04/09/2012  Procedure: NEPHROLITHOTOMY PERCUTANEOUS;  Surgeon: Moise Anes, MD;  Location: [facility];  Service: Urology;  Laterality: Left;   TONSILLECTOMY     TUBAL LIGATION  04/02/2012   '86   Family History Family History  Problem Relation Age of Onset   Cancer Other    Hypertension Other    Hyperlipidemia Other    Obesity Other    Heart attack Other    Breast cancer Maternal Aunt        in mid 27's    Social History Social History   Tobacco Use   Smoking status: Never   Smokeless tobacco: Never  Vaping Use   Vaping status: Never Used  Substance Use Topics   Alcohol use: No   Drug use: No   Allergies Penicillins, Clindamycin /lincomycin, Dapagliflozin, and Valsartan  Review of Systems Review of Systems  Musculoskeletal:  Positive for arthralgias, myalgias and neck pain.    Physical Exam Vital Signs  I have reviewed the triage vital signs BP (!) 170/80   Pulse 95   Temp (!) 97 F (36.1 C) (Oral)   Resp (!) 26   SpO2 100%   Physical Exam Vitals and nursing note reviewed.  Constitutional:      General: She is not in acute distress.    Appearance: She is well-developed.  HENT:     Head: Normocephalic and atraumatic.  Eyes:     Conjunctiva/sclera: Conjunctivae normal.  Cardiovascular:     Rate and Rhythm: Normal rate and regular rhythm.     Heart sounds: No murmur heard. Pulmonary:     Effort: Pulmonary effort is normal. No respiratory distress.     Breath sounds: Normal breath sounds.  Abdominal:     Palpations: Abdomen is soft.     Tenderness: There is no abdominal tenderness.  Musculoskeletal:        General: Tenderness present. No swelling.     Cervical back: Neck supple. Tenderness (Paraspinal) present.  Skin:    General: Skin is warm and dry.     Capillary Refill: Capillary refill takes less than 2 seconds.  Neurological:     Mental Status: She is alert.  Psychiatric:         Mood and Affect: Mood normal.     ED Results and Treatments Labs (all labs ordered are listed, but only abnormal results are displayed) Labs Reviewed  CBG MONITORING, ED - Abnormal; Notable for the following components:      Result Value   Glucose-Capillary 114 (*)    All other components within normal limits  COMPREHENSIVE METABOLIC PANEL WITH GFR  CBC WITH DIFFERENTIAL/PLATELET                                                                                                                          Radiology No results found.  Pertinent labs & imaging results that were available during my care of the patient were reviewed by me and considered in my medical decision making (  see MDM for details).  Medications Ordered in ED Medications - No data to display                                                                                                                                   Procedures Procedures  (including critical care time)  Medical Decision Making / ED Course   This patient presents to the ED for concern of ***, this involves an extensive number of treatment options, and is a complaint that carries with it a high risk of complications and morbidity.  The differential diagnosis includes ***  MDM: ***   Additional history obtained: -Additional history obtained from *** -External records from outside source obtained and reviewed including: Chart review including previous notes, labs, imaging, consultation notes   Lab Tests: -I ordered, reviewed, and interpreted labs.   The pertinent results include:   Labs Reviewed  CBG MONITORING, ED - Abnormal; Notable for the following components:      Result Value   Glucose-Capillary 114 (*)    All other components within normal limits  COMPREHENSIVE METABOLIC PANEL WITH GFR  CBC WITH DIFFERENTIAL/PLATELET      EKG ***  EKG Interpretation Date/Time:    Ventricular Rate:    PR Interval:    QRS Duration:    QT  Interval:    QTC Calculation:   R Axis:      Text Interpretation:           Imaging Studies ordered: I ordered imaging studies including *** I independently visualized and interpreted imaging. I agree with the radiologist interpretation   Medicines ordered and prescription drug management: No orders of the defined types were placed in this encounter.   -I have reviewed the patients home medicines and have made adjustments as needed  Critical interventions ***  Consultations Obtained: I requested consultation with the ***,  and discussed lab and imaging findings as well as pertinent plan - they recommend: ***   Cardiac Monitoring: The patient was maintained on a cardiac monitor.  I personally viewed and interpreted the cardiac monitored which showed an underlying rhythm of: ***  Social Determinants of Health:  Factors impacting patients care include: ***   Reevaluation: After the interventions noted above, I reevaluated the patient and found that they have :{resolved/improved/worsened:23923::"improved"}  Co morbidities that complicate the patient evaluation  Past Medical History:  Diagnosis Date   Anemia    Arthritis 04-02-12   arthritis lower back and spurs   Carotid artery occlusion    Diabetes mellitus 04-02-12   oral med and insulin  used   Diverticulosis of colon 04-02-12   hx. of, no problems   H/O nephrostomy 04-02-12   03-19-12 tube placed and remains lt. flank.   Hypertension    Kidney calculi 04-02-12   hx. kidney stones-left, past hx. kidney stones   Obesity       Dispostion: I considered admission for this  patient, ***     Final Clinical Impression(s) / ED Diagnoses Final diagnoses:  None     @PCDICTATION @

## 2024-02-16 NOTE — ED Notes (Addendum)
 Trauma Response Nurse Documentation   Kristine Gray is a 73 y.o. female arriving to [place] ED via EMS  On clopidogrel  75 mg daily. Trauma was activated as a Level 2 by ED charge RN based on the following trauma criteria Elderly patients > 65 with head trauma on anti-coagulation (excluding ASA).  Patient cleared for CT by Dr. Garrett Kallman. Pt transported to CT with trauma response nurse present to monitor. RN remained with the patient throughout their absence from the department for clinical observation.   GCS 15.   History   Past Medical History:  Diagnosis Date   Anemia    Arthritis 04-02-12   arthritis lower back and spurs   Carotid artery occlusion    Diabetes mellitus 04-02-12   oral med and insulin  used   Diverticulosis of colon 04-02-12   hx. of, no problems   H/O nephrostomy 04-02-12   03-19-12 tube placed and remains lt. flank.   Hypertension    Kidney calculi 04-02-12   hx. kidney stones-left, past hx. kidney stones   Obesity      Past Surgical History:  Procedure Laterality Date   CATARACT EXTRACTION, BILATERAL  04/2021   CESAREAN SECTION  04/02/2012   '86   CHOLECYSTECTOMY     DILATION AND CURETTAGE OF UTERUS  04/02/2012   ENDARTERECTOMY Left 07/16/2020   Procedure: ENDARTERECTOMY CAROTID;  Surgeon: Mayo Speck, MD;  Location: [facility] OR;  Service: Vascular;  Laterality: Left;   iriodotomy  04/02/2012   bil. eyes   NEPHROLITHOTOMY  04/09/2012   Procedure: NEPHROLITHOTOMY PERCUTANEOUS;  Surgeon: Moise Anes, MD;  Location: [facility] ORS;  Service: Urology;  Laterality: Left;   TONSILLECTOMY     TUBAL LIGATION  04/02/2012   '86       Initial Focused Assessment (If applicable, or please see trauma documentation): Alert/oriented female presents via EMS from home after a fall, reports lower neck/shoulder pain, left knee pain, right elbow pain. Abrasion left knee with bleeding controlled. Per Dr. Garrett Kallman, no C-collar needed.   Airway patent, BS clear No obvious  uncontrolled hemorrhage GCS 15 PERRLA 2  CT's Completed:   CT Head and CT C-Spine   Interventions:  Trauma lab draw, IV start by ED RN unsuccessful Portable chest, pelvis, left knee XRAY CT head, c-spine  Plan for disposition:  D/C home  Consults completed:  none  Event Summary: Presents via EMS from home after a ground level fall with posterior head injury, reports left knee, right elbow and neck pain. Abrasion left knee. Trauma scans unremarkable, D/C home.  MTP Summary (If applicable): NA  Bedside handoff with ED RN Kristine Gray.    [name] [name]  Trauma Response RN  Please call TRN at [phone] for further assistance.

## 2024-02-16 NOTE — ED Notes (Signed)
 Patient transported to CT by trauma RN

## 2024-02-16 NOTE — Progress Notes (Signed)
 Orthopedic Tech Progress Note Patient Details:  Kristine Gray 09/16/1951 [id number]  Patient ID: Kristine Gray, female   DOB: 28-Jun-1951, 73 y.o.   MRN: [id number] I attended trauma page. [name] 02/16/2024, 9:23 PM

## 2024-02-16 NOTE — ED Triage Notes (Addendum)
 Per EMS, pt from home, was standing  turned and lost her balance, hit chin and right side of her head.  On Plavix .  She report her CBG was 70, she ate and drank something and now her BG was 116.  Pt experienced no LOC and remembers the situation.   BP 142/86 HR 112 RR 16 O2 96% RA CBG 116

## 2024-02-21 ENCOUNTER — Other Ambulatory Visit (HOSPITAL_BASED_OUTPATIENT_CLINIC_OR_DEPARTMENT_OTHER): Payer: Self-pay

## 2024-02-21 DIAGNOSIS — E1122 Type 2 diabetes mellitus with diabetic chronic kidney disease: Secondary | ICD-10-CM | POA: Diagnosis not present

## 2024-02-21 DIAGNOSIS — R29898 Other symptoms and signs involving the musculoskeletal system: Secondary | ICD-10-CM | POA: Diagnosis not present

## 2024-02-21 DIAGNOSIS — R296 Repeated falls: Secondary | ICD-10-CM | POA: Diagnosis not present

## 2024-02-21 DIAGNOSIS — Z794 Long term (current) use of insulin: Secondary | ICD-10-CM | POA: Diagnosis not present

## 2024-02-21 DIAGNOSIS — Z1330 Encounter for screening examination for mental health and behavioral disorders, unspecified: Secondary | ICD-10-CM | POA: Diagnosis not present

## 2024-02-21 DIAGNOSIS — N183 Chronic kidney disease, stage 3 unspecified: Secondary | ICD-10-CM | POA: Diagnosis not present

## 2024-03-10 DIAGNOSIS — Z794 Long term (current) use of insulin: Secondary | ICD-10-CM | POA: Diagnosis not present

## 2024-03-10 DIAGNOSIS — M545 Low back pain, unspecified: Secondary | ICD-10-CM | POA: Diagnosis not present

## 2024-03-10 DIAGNOSIS — E1165 Type 2 diabetes mellitus with hyperglycemia: Secondary | ICD-10-CM | POA: Diagnosis not present

## 2024-03-10 NOTE — Progress Notes (Signed)
 03/10/2024  [facility]   Patient ID:  Kristine Gray is a 73 y.o. (DOB 11-30-1950) female.  Assessment and Plan  Amaura was seen today for diabetes.  Diagnoses and all orders for this visit:  Lumbar pain -     XR Spine Lumbar Min 4 Views; Future  Type 2 diabetes mellitus with hyperglycemia, with long-term current use of insulin  (*)   Assessment & Plan 1. Diabetes mellitus. - Elevated A1c level at 8.6. - Reported low blood sugar reading of 52 upon waking, with high insulin  dosage (150 units in the first dose, 110 units in the second dose, and 50 units at night). - Lost 5 pounds since last visit. - Recommended to discuss with endocrinologist the possibility of switching to a once-weekly GLP-1 injection to better manage blood sugar levels and potentially reduce insulin  requirements. Appointment with endocrinologist scheduled for next Friday.  2. Back pain. - Worsening back pain since a fall on 01/26/2024, exacerbated by standing and relieved by sitting and applying ice. - Pain localized to the lumbar spine without radicular symptoms. - Reviewed previous x-rays; no compression fractures noted. - Ordered x-ray of the lumbar spine to evaluate for any compression fractures or other abnormalities. External scheduling instructions provided for x-ray.  No follow-ups on file.   Health Maintenance Due  Topic Date Due  . Zoster Vaccine (2 of 3) 08/21/2011  . Diabetes Foot Exam  06/15/2023     Risks, benefits, and alternatives of the medications and treatment plan prescribed today were discussed, and patient expressed understanding. Plan follow-up as discussed or as needed if any worsening symptoms or change in condition.    A yearly preventative health exam was recommended and current age based recommendations were discussed.   Subjective   Patient ID:  Kristine Gray is a 73 y.o. (DOB February 26, 1951) female    Patient presents with  . Diabetes     History  of Present Illness The patient presents for evaluation of diabetes and back pain.  She has been experiencing difficulties in managing her blood glucose levels, with a recent episode of hypoglycemia characterized by sweating and a reading of 52. Her current insulin  regimen includes 150 units in the morning, 110 units in the afternoon, and 50 units at night. She has lost approximately 5 pounds but continues to maintain the same insulin  dosage. She has not yet consulted her endocrinologist regarding her elevated A1c level of 8.6 but has an appointment scheduled for next Friday. She has not been prescribed a once-weekly injection for her diabetes. She has been advised to reduce her insulin  dosage by 15 units if her blood glucose levels are low, which she reports as beneficial. Her nephrologist suggested a weight loss medication, but Dr. Nichole declined due to concerns about rapid drops in blood glucose levels.  She attributes her elevated blood pressure to back pain, which has progressively worsened since a fall on 01/24/2024, where she landed on her back. The pain, initially localized to the left side, has now spread to the middle of her back. Despite undergoing x-rays, no abnormalities were detected. Her pain intensifies when standing for more than 2 to 3 minutes, such as during cooking, but is alleviated when sitting. She rates her current pain level as 4 out of 10, but it can escalate beyond 10 when severe. She does not experience any radicular pain. She finds relief from applying ice to the affected area before engaging in activities that require standing.   Current  Outpatient Medications  Medication Instructions  . acetaminophen  (TYLENOL  ARTHRITIS,MAPAP) 650 mg, Every 8 hours as needed  . allopurinol  (ZYLOPRIM ) 100 mg, Daily  . atorvastatin  (LIPITOR ) 80 mg, Daily  . clopidogrel  bisulfate (PLAVIX ) 75 mg, Daily  . colchicine  0.6 mg, Oral, Daily as needed  . Continuous Blood Gluc Sensor (FREESTYLE LIBRE  2 SENSOR SYSTM) MISC CHANGE EVERY 14 DAYS TO MONITOR BLOOD GLUCOSE CONTINUOUSLY  . cyclobenzaprine  (FLEXERIL ) 10 mg, At bedtime  . famotidine  (PEPCID ) 20 mg, 2 times a day  . gabapentin  (NEURONTIN ) 300 mg capsule Take 1 capsule (300 mg total) by mouth 2 (two) times daily.  . Glucagon  (BAQSIMI  ONE PACK) 3 MG/DOSE POWD as directed Nasal as needed for severe hypoglycemia  . Insulin  Regular Human, Conc, (HUMULIN  R U-500 KWIKPEN) 500 Units/mL SOPN Inject into the skin. Inject 135 units under the skin every morning. Inject 90 units at lunch time and inject 50 units with dinner as directed  . nebivolol  (BYSTOLIC ) 5 mg, Daily  . ondansetron  (ZOFRAN -ODT) 8 mg disintegrating tablet SMARTSIG:1 Tablet(s) Sublingual Every 6 Hours PRN  . PENTIPS 31G X 5 MM MISC USE AS DIRECTED 3 TIMES DAILY  . vitamin B-12 (CYANOCOBALAMIN ) 1000 mcg tablet 1 tablet, Daily   Patient Care Team: Alm FORBES Bilis, MD as PCP - General (Family Medicine) Garnette DELENA Ore, MD as MD Group (Endocinology, Diabetes and Metabolism) Reyes JONELLE Plough, MD as Consulting Physician (Gastroenterology) Curtis DELENA Heman, MD (Nephrology) Penne Lonni Colorado, MD (Vascular Surgery) Zerita Salinas, MD (Ophthalmology) Social History   Tobacco Use  . Smoking status: Never    Passive exposure: Never  . Smokeless tobacco: Never  Substance Use Topics  . Alcohol use: Never    Reviewed and updated this visit by provider: Tobacco  Allergies  Meds  Problems  Med Hx  Surg Hx  Fam Hx       Review of Systems is complete and negative except as noted.  Objective   Vitals:   03/10/24 1554 03/10/24 1711  BP: (!) 158/88 148/88  Pulse: 120   Temp: 97.2 F (36.2 C)   TempSrc: Temporal   Resp: 16   Height: 5' 4 (1.626 m)   Weight: 266 lb (120.7 kg)   BMI (Calculated): 45.6    Wt Readings from Last 3 Encounters:  03/10/24 266 lb (120.7 kg)  02/21/24 271 lb (122.9 kg)  09/17/23 273 lb (123.8 kg)    Physical Exam Musculoskeletal:  Tenderness in the lumbar spine region   Constitutional: Well-developed and well-nourished.  Sitting comfortably conversing normally.  Eyes: Conjunctivae, lids, and EOM are normal. Pupils are equal, round, and reactive to light. Lymphatics: There is no anterior or posterior cervical adenopathy. Neck: Supple with normal range of motion. No thyroid  mass and no thyromegaly present. Musculoskeletal: Gait is normal. Upper and Lower extremities are symmetrical with grossly normal muscle strength and tone with normal range of motion.  Tender to palpation midline lower spine.  Bilateral paraspinal muscle spasm present. Skin: Skin is warm and dry. No rashes or bruising noted.  Psychiatric: Behavior is Cooperative and Polite. Mood euthymyic. Affect is appropriate.  Computer technology was used to create visit note. Consent from the patient/caregiver was obtained prior to its use.  Alm FORBES Bilis, MD

## 2024-03-13 ENCOUNTER — Other Ambulatory Visit: Payer: Self-pay | Admitting: Vascular Surgery

## 2024-03-13 ENCOUNTER — Other Ambulatory Visit (HOSPITAL_BASED_OUTPATIENT_CLINIC_OR_DEPARTMENT_OTHER): Payer: Self-pay | Admitting: Family Medicine

## 2024-03-13 ENCOUNTER — Ambulatory Visit (HOSPITAL_BASED_OUTPATIENT_CLINIC_OR_DEPARTMENT_OTHER)
Admission: RE | Admit: 2024-03-13 | Discharge: 2024-03-13 | Disposition: A | Discharge status: Home / Self Care | Source: Ambulatory Visit | Attending: Family Medicine | Admitting: Family Medicine

## 2024-03-13 DIAGNOSIS — I700 Atherosclerosis of aorta: Secondary | ICD-10-CM | POA: Diagnosis not present

## 2024-03-13 DIAGNOSIS — M545 Low back pain, unspecified: Secondary | ICD-10-CM | POA: Diagnosis not present

## 2024-03-13 DIAGNOSIS — M48061 Spinal stenosis, lumbar region without neurogenic claudication: Secondary | ICD-10-CM | POA: Diagnosis not present

## 2024-03-13 DIAGNOSIS — I6523 Occlusion and stenosis of bilateral carotid arteries: Secondary | ICD-10-CM

## 2024-03-13 DIAGNOSIS — M47816 Spondylosis without myelopathy or radiculopathy, lumbar region: Secondary | ICD-10-CM | POA: Diagnosis not present

## 2024-03-14 DIAGNOSIS — E1129 Type 2 diabetes mellitus with other diabetic kidney complication: Secondary | ICD-10-CM | POA: Diagnosis not present

## 2024-03-14 DIAGNOSIS — D649 Anemia, unspecified: Secondary | ICD-10-CM | POA: Diagnosis not present

## 2024-03-14 DIAGNOSIS — E049 Nontoxic goiter, unspecified: Secondary | ICD-10-CM | POA: Diagnosis not present

## 2024-03-14 DIAGNOSIS — M109 Gout, unspecified: Secondary | ICD-10-CM | POA: Diagnosis not present

## 2024-03-14 DIAGNOSIS — E785 Hyperlipidemia, unspecified: Secondary | ICD-10-CM | POA: Diagnosis not present

## 2024-03-19 ENCOUNTER — Other Ambulatory Visit (HOSPITAL_BASED_OUTPATIENT_CLINIC_OR_DEPARTMENT_OTHER): Payer: Self-pay

## 2024-03-21 ENCOUNTER — Other Ambulatory Visit (HOSPITAL_BASED_OUTPATIENT_CLINIC_OR_DEPARTMENT_OTHER): Payer: Self-pay

## 2024-03-21 DIAGNOSIS — N1832 Chronic kidney disease, stage 3b: Secondary | ICD-10-CM | POA: Diagnosis not present

## 2024-03-21 DIAGNOSIS — Z794 Long term (current) use of insulin: Secondary | ICD-10-CM | POA: Diagnosis not present

## 2024-03-21 DIAGNOSIS — H409 Unspecified glaucoma: Secondary | ICD-10-CM | POA: Diagnosis not present

## 2024-03-21 DIAGNOSIS — D649 Anemia, unspecified: Secondary | ICD-10-CM | POA: Diagnosis not present

## 2024-03-21 DIAGNOSIS — K219 Gastro-esophageal reflux disease without esophagitis: Secondary | ICD-10-CM | POA: Diagnosis not present

## 2024-03-21 DIAGNOSIS — I1310 Hypertensive heart and chronic kidney disease without heart failure, with stage 1 through stage 4 chronic kidney disease, or unspecified chronic kidney disease: Secondary | ICD-10-CM | POA: Diagnosis not present

## 2024-03-21 DIAGNOSIS — E049 Nontoxic goiter, unspecified: Secondary | ICD-10-CM | POA: Diagnosis not present

## 2024-03-21 DIAGNOSIS — M109 Gout, unspecified: Secondary | ICD-10-CM | POA: Diagnosis not present

## 2024-03-21 DIAGNOSIS — I6523 Occlusion and stenosis of bilateral carotid arteries: Secondary | ICD-10-CM | POA: Diagnosis not present

## 2024-03-21 DIAGNOSIS — E785 Hyperlipidemia, unspecified: Secondary | ICD-10-CM | POA: Diagnosis not present

## 2024-03-21 DIAGNOSIS — E1122 Type 2 diabetes mellitus with diabetic chronic kidney disease: Secondary | ICD-10-CM | POA: Diagnosis not present

## 2024-03-21 MED ORDER — FAMOTIDINE 20 MG PO TABS
20.0000 mg | ORAL_TABLET | Freq: Two times a day (BID) | ORAL | 3 refills | Status: AC
  Filled 2024-03-21 – 2024-06-09 (×2): qty 180, 90d supply, fill #0
  Filled 2024-08-26: qty 180, 90d supply, fill #1

## 2024-03-21 MED ORDER — HUMULIN R U-500 KWIKPEN 500 UNIT/ML ~~LOC~~ SOPN
PEN_INJECTOR | SUBCUTANEOUS | 3 refills | Status: AC
  Filled 2024-03-21 – 2024-05-22 (×3): qty 54, 93d supply, fill #0

## 2024-03-21 MED ORDER — HYDROCHLOROTHIAZIDE 25 MG PO TABS
25.0000 mg | ORAL_TABLET | Freq: Every morning | ORAL | 3 refills | Status: AC
  Filled 2024-03-21 – 2024-05-22 (×2): qty 90, 90d supply, fill #0

## 2024-03-24 DIAGNOSIS — R296 Repeated falls: Secondary | ICD-10-CM | POA: Diagnosis not present

## 2024-03-24 DIAGNOSIS — E1122 Type 2 diabetes mellitus with diabetic chronic kidney disease: Secondary | ICD-10-CM | POA: Diagnosis not present

## 2024-03-24 DIAGNOSIS — N183 Chronic kidney disease, stage 3 unspecified: Secondary | ICD-10-CM | POA: Diagnosis not present

## 2024-03-24 DIAGNOSIS — R29898 Other symptoms and signs involving the musculoskeletal system: Secondary | ICD-10-CM | POA: Diagnosis not present

## 2024-03-24 DIAGNOSIS — Z794 Long term (current) use of insulin: Secondary | ICD-10-CM | POA: Diagnosis not present

## 2024-03-26 ENCOUNTER — Encounter: Payer: Self-pay | Admitting: Physician Assistant

## 2024-03-26 ENCOUNTER — Ambulatory Visit (HOSPITAL_COMMUNITY)
Admission: RE | Admit: 2024-03-26 | Discharge: 2024-03-26 | Disposition: A | Discharge status: Home / Self Care | Source: Ambulatory Visit | Attending: Vascular Surgery | Admitting: Vascular Surgery

## 2024-03-26 ENCOUNTER — Ambulatory Visit: Admitting: Physician Assistant

## 2024-03-26 VITALS — BP 125/83 | HR 101 | Temp 98.0°F | Ht 64.0 in | Wt 268.8 lb

## 2024-03-26 DIAGNOSIS — I6523 Occlusion and stenosis of bilateral carotid arteries: Secondary | ICD-10-CM | POA: Diagnosis not present

## 2024-03-26 NOTE — Progress Notes (Signed)
 HISTORY AND PHYSICAL     CC:  follow up. Requesting Provider:  Pura Lenis, MD  HPI: This is a 73 y.o. female here for follow up for carotid artery stenosis.  Pt is s/p left CEA for symptomatic carotid artery stenosis on 07/16/2020 by Dr. Oris.    Pt was last seen 01/31/2023 and at that time she was not having any neurological sx.  She was having some right leg pain.  Pt returns today for follow up.    Pt denies any amaurosis fugax, weakness, numbness, paralysis or clumsiness or facial droop.  She states that she sometimes has to stop and think about what she is going to say.  She had trouble with speech when she had her stroke.    She does have some left arm pain that she attributes to her neck issues.  It is similar but less severe than it was prior to her neck surgery.  She denies any chest pain or shortness of breath  The pt is on a statin for cholesterol management.  The pt is on a daily aspirin .   Other AC:  Plavix  The pt is on BB, diuretic for hypertension.   The pt is  on medication for diabetes Tobacco hx:  never  Pt does not have family hx of AAA.  Past Medical History:  Diagnosis Date   Anemia    Arthritis 04-02-12   arthritis lower back and spurs   Carotid artery occlusion    Diabetes mellitus 04-02-12   oral med and insulin  used   Diverticulosis of colon 04-02-12   hx. of, no problems   H/O nephrostomy 04-02-12   03-19-12 tube placed and remains lt. flank.   Hypertension    Kidney calculi 04-02-12   hx. kidney stones-left, past hx. kidney stones   Obesity     Past Surgical History:  Procedure Laterality Date   CATARACT EXTRACTION, BILATERAL  04/2021   CESAREAN SECTION  04/02/2012   '86   CHOLECYSTECTOMY     DILATION AND CURETTAGE OF UTERUS  04/02/2012   ENDARTERECTOMY Left 07/16/2020   Procedure: ENDARTERECTOMY CAROTID;  Surgeon: Oris Krystal FALCON, MD;  Location: [facility] OR;  Service: Vascular;  Laterality: Left;   iriodotomy  04/02/2012   bil. eyes   NEPHROLITHOTOMY   04/09/2012   Procedure: NEPHROLITHOTOMY PERCUTANEOUS;  Surgeon: Donnice Gwenyth Brooks, MD;  Location: [facility] ORS;  Service: Urology;  Laterality: Left;   TONSILLECTOMY     TUBAL LIGATION  04/02/2012   '86    Allergies  Allergen Reactions   Penicillins Hives, Itching and Swelling   Clindamycin /Lincomycin     Other reaction(s): Unknown Trouble breathing   Dapagliflozin     Other reaction(s): Dizziness   Valsartan Other (See Comments)    Bradycardia    Current Outpatient Medications  Medication Sig Dispense Refill   acetaminophen  (TYLENOL ) 650 MG CR tablet Take 650 mg by mouth every 8 (eight) hours as needed for pain.     allopurinol  (ZYLOPRIM ) 100 MG tablet Take 1 tablet (100 mg total) by mouth daily. 90 tablet 4   atorvastatin  (LIPITOR ) 80 MG tablet Take 1 tablet (80 mg total) by mouth daily. 90 tablet 4   Cholecalciferol (NATURAL VITAMIN D-3) 125 MCG (5000 UT) TABS Take 5,000 Units by mouth daily.     clopidogrel  (PLAVIX ) 75 MG tablet Take 1 tablet (75 mg total) by mouth daily. 90 tablet 4   colchicine  0.6 MG tablet Take 1 tablet by mouth once daily as needed  90 tablet 1   colchicine  0.6 MG tablet Take 1 tablet (0.6 mg total) by mouth daily as needed. 30 tablet 1   Continuous Blood Gluc Sensor (FREESTYLE LIBRE 2 SENSOR) MISC Change every 14 days to monitor blood glucose continuously as directed. 6 each 3   Continuous Glucose Sensor (FREESTYLE LIBRE 2 SENSOR) MISC Change every 14 days to monitor blood glucose continuously subcutanesouly as directed 6 each 3   Cyanocobalamin  (VITAMIN B-12) 5000 MCG TBDP Take 5,000 Units by mouth daily.     cyclobenzaprine  (FLEXERIL ) 10 MG tablet Take 1 tablet (10 mg total) by mouth daily. 90 tablet 3   famotidine  (PEPCID ) 20 MG tablet Take 1 tablet (20 mg total) by mouth 2 (two) times daily. 180 tablet 3   famotidine  (PEPCID ) 20 MG tablet Take 1 tablet (20 mg total) by mouth 2 (two) times daily. 180 tablet 3   gabapentin  (NEURONTIN ) 300 MG capsule TAKE  1 CAPSULE BY MOUTH ONCE DAILY 90 capsule 3   gabapentin  (NEURONTIN ) 300 MG capsule Take 1 capsule (300 mg total) by mouth 2 (two) times daily. 180 capsule 1   Glucagon  (BAQSIMI  TWO PACK) 3 MG/DOSE POWD Use as directed as needed for severe hypoglycemia. 2 each 11   Glucagon  (BAQSIMI  TWO PACK) 3 MG/DOSE POWD Use as directed Nasally as needed for severe hypoglycemia 2 each 11   hydrochlorothiazide  (HYDRODIURIL ) 25 MG tablet Take 1 tablet (25 mg total) by mouth in the morning. 90 tablet 3   Insulin  Pen Needle (B-D UF III MINI PEN NEEDLES) 31G X 5 MM MISC Use as directed 3 (three) times daily. 300 each 4   insulin  regular human CONCENTRATED (HUMULIN  R U-500 KWIKPEN) 500 UNIT/ML KwikPen Inject 150 units every morning, inject 100 units at lunch time and inject 40 units with dinner Subcutaneous three times daily 90 days 54 mL 3   insulin  regular human CONCENTRATED (HUMULIN  R U-500 KWIKPEN) 500 UNIT/ML KwikPen Inject 150 units every morning, 100 units at lunch time and 40 units with dinner. 54 mL 3   insulin  regular human CONCENTRATED (HUMULIN  R U-500 KWIKPEN) 500 UNIT/ML KwikPen Inject 150 Units into the skin in the morning, 100 Units daily with lunch AND 40 Units daily with dinner. 54 mL 3   magnesium  oxide (MAG-OX) 400 (240 Mg) MG tablet Take 1 tablet (400 mg total) by mouth 2 (two) times daily. (Patient taking differently: Take 200 mg by mouth 2 (two) times daily.) 120 tablet 0   nebivolol  (BYSTOLIC ) 10 MG tablet Take 1 tablet (10 mg total) by mouth 2 (two) times daily. 270 tablet 3   ondansetron  (ZOFRAN -ODT) 4 MG disintegrating tablet Take 1 tablet (4 mg total) by mouth daily. Place on the tongue and allow to dissolve. 30 tablet 12   ondansetron  (ZOFRAN -ODT) 8 MG disintegrating tablet Take 1 tablet (8 mg total) by mouth every 6 (six) hours as needed. 20 tablet 1   predniSONE  (DELTASONE ) 20 MG tablet Take 1 tablet (20 mg total) by mouth daily before breakfast. 3 tablet 0   traMADol  (ULTRAM ) 50 MG tablet  Take 1 tablet (50 mg total) by mouth every 6 (six) hours as needed for pain for up to 7 days. Max Daily Amount: 200 mg. 20 tablet 0   vitamin E  180 MG (400 UNITS) capsule Take 400 Units by mouth daily.     No current facility-administered medications for this visit.    Family History  Problem Relation Age of Onset   Cancer Other  Hypertension Other    Hyperlipidemia Other    Obesity Other    Heart attack Other    Breast cancer Maternal Aunt        in mid 23's    Social History   Socioeconomic History   Marital status: Married    Spouse name: Not on file   Number of children: Not on file   Years of education: Not on file   Highest education level: Not on file  Occupational History   Not on file  Tobacco Use   Smoking status: Never   Smokeless tobacco: Never  Vaping Use   Vaping status: Never Used  Substance and Sexual Activity   Alcohol use: No   Drug use: No   Sexual activity: Yes  Other Topics Concern   Not on file  Social History Narrative   Not on file   Social Drivers of Health   Financial Resource Strain: Low Risk  (02/21/2024)   Received from [facility]   Overall Financial Resource Strain (CARDIA)    Difficulty of Paying Living Expenses: Not hard at all  Food Insecurity: No Food Insecurity (02/21/2024)   Received from [facility]   Hunger Vital Sign    Within the past 12 months, you worried that your food would run out before you got the money to buy more.: Never true    Within the past 12 months, the food you bought just didn't last and you didn't have money to get more.: Never true  Transportation Needs: No Transportation Needs (02/21/2024)   Received from [facility] - Transportation    Lack of Transportation (Medical): No    Lack of Transportation (Non-Medical): No  Physical Activity: Insufficiently Active (09/17/2023)   Received from [facility]   Exercise Vital Sign    On average, how many days per week do you engage in  moderate to strenuous exercise (like a brisk walk)?: 4 days    On average, how many minutes do you engage in exercise at this level?: 10 min  Stress: No Stress Concern Present (09/17/2023)   Received from [facility] - Occupational Stress Questionnaire    Feeling of Stress : Not at all  Social Connections: Patient Declined (09/17/2023)   Received from [facility]   Social Network    How would you rate your social network (family, work, friends)?: Patient declined  Intimate Partner Violence: Not At Risk (09/17/2023)   Received from [facility]   HITS    Over the last 12 months how often did your partner physically hurt you?: Never    Over the last 12 months how often did your partner insult you or talk down to you?: Never    Over the last 12 months how often did your partner threaten you with physical harm?: Never    Over the last 12 months how often did your partner scream or curse at you?: Never     REVIEW OF SYSTEMS:   [X]  denotes positive finding, [ ]  denotes negative finding Cardiac  Comments:  Chest pain or chest pressure:    Shortness of breath upon exertion:    Short of breath when lying flat:    Irregular heart rhythm:        Vascular    Pain in calf, thigh, or hip brought on by ambulation:    Pain in feet at night that wakes you up from your sleep:  Blood clot in your veins:    Leg swelling:         Pulmonary    Oxygen at home:    Productive cough:     Wheezing:         Neurologic    Sudden weakness in arms or legs:     Sudden numbness in arms or legs:     Sudden onset of difficulty speaking or slurred speech:    Temporary loss of vision in one eye:     Problems with dizziness:         Gastrointestinal    Blood in stool:     Vomited blood:         Genitourinary    Burning when urinating:     Blood in urine:        Psychiatric    Major depression:         Hematologic    Bleeding problems:     Problems with blood clotting too easily:        Skin    Rashes or ulcers:        Constitutional    Fever or chills:      PHYSICAL EXAMINATION:  Today's Vitals   03/26/24 1239 03/26/24 1243  BP: 104/65 125/83  Pulse: (!) 101   Temp: 98 F (36.7 C)   TempSrc: Temporal   SpO2: 95%   Weight: 268 lb 12.8 oz (121.9 kg)   Height: 5' 4 (1.626 m)   PainSc: 0-No pain    Body mass index is 46.14 kg/m.   General:  WDWN in NAD; vital signs documented above Gait: Not observed HENT: WNL, normocephalic Pulmonary: normal non-labored breathing Cardiac: regular HR, without carotid bruits Abdomen: soft, NT; aortic pulse is not palpable Skin: well healed neck incision Vascular Exam/Pulses:  Right Left  Radial 2+ (normal) 2+ (normal)  DP 2+ (normal) 2+ (normal)   Extremities: without open wounds Musculoskeletal: no muscle wasting or atrophy  Neurologic: A&O X 3; moving all extremities equally; speech is fluent/normal Psychiatric:  The pt has Normal affect.   Non-Invasive Vascular Imaging:   Carotid Duplex on 03/26/2024 Right:  1-39% ICA stenosis Left:  minimal wall thickening    ASSESSMENT/PLAN:: 73 y.o. female here for follow up carotid artery stenosis and has hx of left CEA for symptomatic carotid artery stenosis on 07/16/2020 by Dr. Oris.    -duplex today reveals right ICA stenosis of 1-39% and the left is near normal.  She remains asymptomatic -discussed s/s of stroke with pt and she understands should she develop any of these sx, she will go to the nearest ER or call 911. -pt will f/u in one year with carotid duplex -pt will call sooner should she have any issues. -continue statin/Plavix danae   Lucie Apt, [facility] [phone]  Clinic MD:  Serene on call MD

## 2024-03-31 ENCOUNTER — Other Ambulatory Visit (HOSPITAL_BASED_OUTPATIENT_CLINIC_OR_DEPARTMENT_OTHER): Payer: Self-pay

## 2024-04-01 ENCOUNTER — Other Ambulatory Visit (HOSPITAL_BASED_OUTPATIENT_CLINIC_OR_DEPARTMENT_OTHER): Payer: Self-pay

## 2024-04-01 DIAGNOSIS — N1831 Chronic kidney disease, stage 3a: Secondary | ICD-10-CM | POA: Diagnosis not present

## 2024-04-07 DIAGNOSIS — I129 Hypertensive chronic kidney disease with stage 1 through stage 4 chronic kidney disease, or unspecified chronic kidney disease: Secondary | ICD-10-CM | POA: Diagnosis not present

## 2024-04-07 DIAGNOSIS — N1831 Chronic kidney disease, stage 3a: Secondary | ICD-10-CM | POA: Diagnosis not present

## 2024-04-07 DIAGNOSIS — N200 Calculus of kidney: Secondary | ICD-10-CM | POA: Diagnosis not present

## 2024-04-07 DIAGNOSIS — M109 Gout, unspecified: Secondary | ICD-10-CM | POA: Diagnosis not present

## 2024-04-07 DIAGNOSIS — E1122 Type 2 diabetes mellitus with diabetic chronic kidney disease: Secondary | ICD-10-CM | POA: Diagnosis not present

## 2024-04-07 DIAGNOSIS — E785 Hyperlipidemia, unspecified: Secondary | ICD-10-CM | POA: Diagnosis not present

## 2024-04-09 DIAGNOSIS — H524 Presbyopia: Secondary | ICD-10-CM | POA: Diagnosis not present

## 2024-04-09 DIAGNOSIS — D3131 Benign neoplasm of right choroid: Secondary | ICD-10-CM | POA: Diagnosis not present

## 2024-04-09 DIAGNOSIS — H402231 Chronic angle-closure glaucoma, bilateral, mild stage: Secondary | ICD-10-CM | POA: Diagnosis not present

## 2024-04-09 DIAGNOSIS — E119 Type 2 diabetes mellitus without complications: Secondary | ICD-10-CM | POA: Diagnosis not present

## 2024-04-13 ENCOUNTER — Other Ambulatory Visit (HOSPITAL_BASED_OUTPATIENT_CLINIC_OR_DEPARTMENT_OTHER): Payer: Self-pay

## 2024-04-14 ENCOUNTER — Other Ambulatory Visit (HOSPITAL_BASED_OUTPATIENT_CLINIC_OR_DEPARTMENT_OTHER): Payer: Self-pay

## 2024-04-14 MED ORDER — GABAPENTIN 300 MG PO CAPS
300.0000 mg | ORAL_CAPSULE | Freq: Two times a day (BID) | ORAL | 1 refills | Status: AC
  Filled 2024-04-14: qty 180, 90d supply, fill #0
  Filled 2024-07-13: qty 180, 90d supply, fill #1

## 2024-04-22 ENCOUNTER — Other Ambulatory Visit (HOSPITAL_BASED_OUTPATIENT_CLINIC_OR_DEPARTMENT_OTHER): Payer: Self-pay

## 2024-04-23 ENCOUNTER — Encounter (HOSPITAL_BASED_OUTPATIENT_CLINIC_OR_DEPARTMENT_OTHER): Payer: Self-pay

## 2024-04-24 ENCOUNTER — Other Ambulatory Visit (HOSPITAL_BASED_OUTPATIENT_CLINIC_OR_DEPARTMENT_OTHER): Payer: Self-pay

## 2024-04-24 DIAGNOSIS — E1122 Type 2 diabetes mellitus with diabetic chronic kidney disease: Secondary | ICD-10-CM | POA: Diagnosis not present

## 2024-04-24 DIAGNOSIS — R296 Repeated falls: Secondary | ICD-10-CM | POA: Diagnosis not present

## 2024-04-24 DIAGNOSIS — R29898 Other symptoms and signs involving the musculoskeletal system: Secondary | ICD-10-CM | POA: Diagnosis not present

## 2024-04-24 DIAGNOSIS — Z794 Long term (current) use of insulin: Secondary | ICD-10-CM | POA: Diagnosis not present

## 2024-04-24 DIAGNOSIS — N183 Chronic kidney disease, stage 3 unspecified: Secondary | ICD-10-CM | POA: Diagnosis not present

## 2024-04-28 ENCOUNTER — Other Ambulatory Visit (HOSPITAL_BASED_OUTPATIENT_CLINIC_OR_DEPARTMENT_OTHER): Payer: Self-pay

## 2024-04-28 MED ORDER — FREESTYLE LIBRE 3 PLUS SENSOR MISC
3 refills | Status: AC
  Filled 2024-04-28: qty 6, 90d supply, fill #0
  Filled 2024-06-27 – 2024-07-13 (×2): qty 6, 90d supply, fill #1
  Filled 2024-09-02: qty 6, 90d supply, fill #2

## 2024-05-01 DIAGNOSIS — I2510 Atherosclerotic heart disease of native coronary artery without angina pectoris: Secondary | ICD-10-CM | POA: Diagnosis not present

## 2024-05-01 DIAGNOSIS — E1122 Type 2 diabetes mellitus with diabetic chronic kidney disease: Secondary | ICD-10-CM | POA: Diagnosis not present

## 2024-05-01 DIAGNOSIS — I129 Hypertensive chronic kidney disease with stage 1 through stage 4 chronic kidney disease, or unspecified chronic kidney disease: Secondary | ICD-10-CM | POA: Diagnosis not present

## 2024-05-01 DIAGNOSIS — N1832 Chronic kidney disease, stage 3b: Secondary | ICD-10-CM | POA: Diagnosis not present

## 2024-05-01 DIAGNOSIS — Z794 Long term (current) use of insulin: Secondary | ICD-10-CM | POA: Diagnosis not present

## 2024-05-22 ENCOUNTER — Other Ambulatory Visit (HOSPITAL_BASED_OUTPATIENT_CLINIC_OR_DEPARTMENT_OTHER): Payer: Self-pay

## 2024-05-23 ENCOUNTER — Other Ambulatory Visit: Payer: Self-pay

## 2024-05-23 ENCOUNTER — Other Ambulatory Visit (HOSPITAL_BASED_OUTPATIENT_CLINIC_OR_DEPARTMENT_OTHER): Payer: Self-pay

## 2024-05-23 DIAGNOSIS — R29898 Other symptoms and signs involving the musculoskeletal system: Secondary | ICD-10-CM | POA: Diagnosis not present

## 2024-05-23 DIAGNOSIS — R296 Repeated falls: Secondary | ICD-10-CM | POA: Diagnosis not present

## 2024-05-23 DIAGNOSIS — Z794 Long term (current) use of insulin: Secondary | ICD-10-CM | POA: Diagnosis not present

## 2024-05-23 DIAGNOSIS — E1122 Type 2 diabetes mellitus with diabetic chronic kidney disease: Secondary | ICD-10-CM | POA: Diagnosis not present

## 2024-05-23 DIAGNOSIS — N183 Chronic kidney disease, stage 3 unspecified: Secondary | ICD-10-CM | POA: Diagnosis not present

## 2024-05-29 ENCOUNTER — Other Ambulatory Visit (HOSPITAL_BASED_OUTPATIENT_CLINIC_OR_DEPARTMENT_OTHER): Payer: Self-pay

## 2024-06-10 ENCOUNTER — Other Ambulatory Visit (HOSPITAL_BASED_OUTPATIENT_CLINIC_OR_DEPARTMENT_OTHER): Payer: Self-pay

## 2024-06-12 ENCOUNTER — Other Ambulatory Visit (HOSPITAL_BASED_OUTPATIENT_CLINIC_OR_DEPARTMENT_OTHER): Payer: Self-pay

## 2024-06-24 DIAGNOSIS — R296 Repeated falls: Secondary | ICD-10-CM | POA: Diagnosis not present

## 2024-06-24 DIAGNOSIS — Z794 Long term (current) use of insulin: Secondary | ICD-10-CM | POA: Diagnosis not present

## 2024-06-24 DIAGNOSIS — E1122 Type 2 diabetes mellitus with diabetic chronic kidney disease: Secondary | ICD-10-CM | POA: Diagnosis not present

## 2024-06-24 DIAGNOSIS — N183 Chronic kidney disease, stage 3 unspecified: Secondary | ICD-10-CM | POA: Diagnosis not present

## 2024-06-24 DIAGNOSIS — R29898 Other symptoms and signs involving the musculoskeletal system: Secondary | ICD-10-CM | POA: Diagnosis not present

## 2024-06-27 ENCOUNTER — Other Ambulatory Visit (HOSPITAL_BASED_OUTPATIENT_CLINIC_OR_DEPARTMENT_OTHER): Payer: Self-pay

## 2024-06-28 ENCOUNTER — Other Ambulatory Visit (HOSPITAL_BASED_OUTPATIENT_CLINIC_OR_DEPARTMENT_OTHER): Payer: Self-pay

## 2024-07-03 ENCOUNTER — Other Ambulatory Visit (HOSPITAL_BASED_OUTPATIENT_CLINIC_OR_DEPARTMENT_OTHER): Payer: Self-pay

## 2024-07-15 ENCOUNTER — Other Ambulatory Visit (HOSPITAL_BASED_OUTPATIENT_CLINIC_OR_DEPARTMENT_OTHER): Payer: Self-pay

## 2024-07-18 DIAGNOSIS — R29898 Other symptoms and signs involving the musculoskeletal system: Secondary | ICD-10-CM | POA: Diagnosis not present

## 2024-07-18 DIAGNOSIS — Z794 Long term (current) use of insulin: Secondary | ICD-10-CM | POA: Diagnosis not present

## 2024-07-18 DIAGNOSIS — E1122 Type 2 diabetes mellitus with diabetic chronic kidney disease: Secondary | ICD-10-CM | POA: Diagnosis not present

## 2024-07-18 DIAGNOSIS — R296 Repeated falls: Secondary | ICD-10-CM | POA: Diagnosis not present

## 2024-07-18 DIAGNOSIS — N183 Chronic kidney disease, stage 3 unspecified: Secondary | ICD-10-CM | POA: Diagnosis not present

## 2024-08-19 ENCOUNTER — Other Ambulatory Visit (HOSPITAL_BASED_OUTPATIENT_CLINIC_OR_DEPARTMENT_OTHER): Payer: Self-pay

## 2024-08-26 ENCOUNTER — Other Ambulatory Visit: Payer: Self-pay

## 2024-08-26 ENCOUNTER — Other Ambulatory Visit (HOSPITAL_BASED_OUTPATIENT_CLINIC_OR_DEPARTMENT_OTHER): Payer: Self-pay

## 2024-08-27 ENCOUNTER — Other Ambulatory Visit (HOSPITAL_BASED_OUTPATIENT_CLINIC_OR_DEPARTMENT_OTHER): Payer: Self-pay

## 2024-08-27 MED ORDER — ONDANSETRON 4 MG PO TBDP
4.0000 mg | ORAL_TABLET | Freq: Every day | ORAL | 12 refills | Status: AC
  Filled 2024-08-27: qty 30, 30d supply, fill #0

## 2024-08-27 MED ORDER — TRAMADOL HCL 50 MG PO TABS
50.0000 mg | ORAL_TABLET | Freq: Four times a day (QID) | ORAL | 0 refills | Status: AC | PRN
  Filled 2024-08-27: qty 20, 5d supply, fill #0

## 2024-08-27 MED ORDER — ALLOPURINOL 100 MG PO TABS
100.0000 mg | ORAL_TABLET | Freq: Every day | ORAL | 4 refills | Status: AC
  Filled 2024-08-27: qty 90, 90d supply, fill #0

## 2024-08-28 ENCOUNTER — Other Ambulatory Visit (HOSPITAL_BASED_OUTPATIENT_CLINIC_OR_DEPARTMENT_OTHER): Payer: Self-pay

## 2024-08-28 MED ORDER — INSULIN SYRINGE-NEEDLE U-100 31G X 5/16" 0.3 ML MISC
1 refills | Status: AC
  Filled 2024-08-28: qty 300, 100d supply, fill #0

## 2024-08-28 MED ORDER — ONDANSETRON 4 MG PO TBDP
4.0000 mg | ORAL_TABLET | Freq: Every day | ORAL | 12 refills | Status: AC
  Filled 2024-08-28: qty 90, 90d supply, fill #0

## 2024-08-28 MED ORDER — FAMOTIDINE 20 MG PO TABS
20.0000 mg | ORAL_TABLET | Freq: Two times a day (BID) | ORAL | 3 refills | Status: AC
  Filled 2024-08-28: qty 180, 90d supply, fill #0

## 2024-08-28 MED ORDER — HYDROCHLOROTHIAZIDE 25 MG PO TABS
25.0000 mg | ORAL_TABLET | Freq: Every morning | ORAL | 3 refills | Status: AC
  Filled 2024-08-28: qty 90, 90d supply, fill #0

## 2024-08-28 MED ORDER — INSULIN PEN NEEDLE 31G X 5 MM MISC
4 refills | Status: AC
  Filled 2024-08-28: qty 300, 90d supply, fill #0

## 2024-08-29 ENCOUNTER — Other Ambulatory Visit (HOSPITAL_BASED_OUTPATIENT_CLINIC_OR_DEPARTMENT_OTHER): Payer: Self-pay

## 2024-09-01 ENCOUNTER — Other Ambulatory Visit (HOSPITAL_BASED_OUTPATIENT_CLINIC_OR_DEPARTMENT_OTHER): Payer: Self-pay

## 2024-09-02 ENCOUNTER — Other Ambulatory Visit (HOSPITAL_BASED_OUTPATIENT_CLINIC_OR_DEPARTMENT_OTHER): Payer: Self-pay
